# Patient Record
Sex: Female | Born: 1995 | Race: White | Hispanic: No | Marital: Single | Smoking: Current every day smoker
Health system: Southern US, Community
[De-identification: ages and names within clinical notes are randomized; demographics above are authoritative.]

## PROBLEM LIST (undated history)

## (undated) ENCOUNTER — Inpatient Hospital Stay: Payer: Self-pay

## (undated) DIAGNOSIS — D649 Anemia, unspecified: Secondary | ICD-10-CM

## (undated) DIAGNOSIS — Z8669 Personal history of other diseases of the nervous system and sense organs: Secondary | ICD-10-CM

## (undated) DIAGNOSIS — Z789 Other specified health status: Secondary | ICD-10-CM

## (undated) DIAGNOSIS — F419 Anxiety disorder, unspecified: Secondary | ICD-10-CM

## (undated) DIAGNOSIS — R569 Unspecified convulsions: Secondary | ICD-10-CM

## (undated) HISTORY — DX: Unspecified convulsions: R56.9

## (undated) HISTORY — DX: Personal history of other diseases of the nervous system and sense organs: Z86.69

## (undated) HISTORY — DX: Anemia, unspecified: D64.9

## (undated) HISTORY — DX: Anxiety disorder, unspecified: F41.9

## (undated) HISTORY — PX: NO PAST SURGERIES: SHX2092

---
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
## 2013-03-03 ENCOUNTER — Ambulatory Visit: Payer: Self-pay | Admitting: Family Medicine

## 2013-06-09 ENCOUNTER — Emergency Department: Payer: Self-pay | Admitting: Emergency Medicine

## 2013-06-09 LAB — COMPREHENSIVE METABOLIC PANEL
Albumin: 4.1 g/dL (ref 3.8–5.6)
Alkaline Phosphatase: 78 U/L — ABNORMAL LOW (ref 82–169)
Anion Gap: 6 — ABNORMAL LOW (ref 7–16)
BUN: 8 mg/dL (ref 7–18)
Bilirubin,Total: 0.3 mg/dL (ref 0.2–1.0)
Co2: 26 mmol/L (ref 21–32)
Osmolality: 276 (ref 275–301)
Potassium: 3.8 mmol/L (ref 3.5–5.1)
SGOT(AST): 25 U/L (ref 0–26)
Total Protein: 8.1 g/dL (ref 6.4–8.6)

## 2013-06-09 LAB — CBC
MCHC: 32.4 g/dL (ref 32.0–36.0)
RDW: 18.2 % — ABNORMAL HIGH (ref 11.5–14.5)
WBC: 10.3 10*3/uL (ref 3.6–11.0)

## 2013-06-09 LAB — URINALYSIS, COMPLETE
Bacteria: NONE SEEN
Blood: NEGATIVE
Ketone: NEGATIVE
Leukocyte Esterase: NEGATIVE
Nitrite: NEGATIVE
Protein: NEGATIVE
RBC,UR: 1 /HPF (ref 0–5)

## 2013-06-09 LAB — PROTIME-INR: Prothrombin Time: 14 secs (ref 11.5–14.7)

## 2013-06-09 LAB — LIPASE, BLOOD: Lipase: 161 U/L (ref 73–393)

## 2014-06-09 ENCOUNTER — Emergency Department: Payer: Self-pay | Admitting: Emergency Medicine

## 2014-06-09 LAB — URINALYSIS, COMPLETE
Bilirubin,UR: NEGATIVE
Blood: NEGATIVE
GLUCOSE, UR: NEGATIVE mg/dL (ref 0–75)
Leukocyte Esterase: NEGATIVE
Nitrite: NEGATIVE
PROTEIN: NEGATIVE
Ph: 5 (ref 4.5–8.0)
Specific Gravity: 1.02 (ref 1.003–1.030)
WBC UR: 2 /HPF (ref 0–5)

## 2014-06-09 LAB — COMPREHENSIVE METABOLIC PANEL
ALBUMIN: 4.4 g/dL (ref 3.4–5.0)
ALK PHOS: 67 U/L
ANION GAP: 7 (ref 7–16)
BUN: 8 mg/dL (ref 7–18)
Bilirubin,Total: 0.4 mg/dL (ref 0.2–1.0)
CALCIUM: 9.1 mg/dL (ref 8.5–10.1)
CHLORIDE: 106 mmol/L (ref 98–107)
CREATININE: 0.78 mg/dL (ref 0.60–1.30)
Co2: 28 mmol/L (ref 21–32)
EGFR (African American): >60
GLUCOSE: 83 mg/dL (ref 65–99)
OSMOLALITY: 279 (ref 275–301)
Potassium: 3.6 mmol/L (ref 3.5–5.1)
SGOT(AST): 22 U/L (ref 15–37)
SGPT (ALT): 22 U/L
Sodium: 141 mmol/L (ref 136–145)
Total Protein: 7.9 g/dL (ref 6.4–8.2)

## 2014-06-09 LAB — CBC WITH DIFFERENTIAL/PLATELET
Basophil #: 0.1 10*3/uL (ref 0.0–0.1)
Basophil %: 0.8 %
EOS ABS: 0 10*3/uL (ref 0.0–0.7)
Eosinophil %: 0.5 %
HCT: 41.6 % (ref 35.0–47.0)
HGB: 13.8 g/dL (ref 12.0–16.0)
LYMPHS ABS: 2.4 10*3/uL (ref 1.0–3.6)
Lymphocyte %: 32 %
MCH: 28.4 pg (ref 26.0–34.0)
MCHC: 33 g/dL (ref 32.0–36.0)
MCV: 86 fL (ref 80–100)
MONOS PCT: 5.2 %
Monocyte #: 0.4 x10 3/mm (ref 0.2–0.9)
NEUTROS ABS: 4.6 10*3/uL (ref 1.4–6.5)
Neutrophil %: 61.5 %
Platelet: 261 10*3/uL (ref 150–440)
RBC: 4.84 10*6/uL (ref 3.80–5.20)
RDW: 15 % — ABNORMAL HIGH (ref 11.5–14.5)
WBC: 7.4 10*3/uL (ref 3.6–11.0)

## 2014-06-09 LAB — TSH: Thyroid Stimulating Horm: 0.687 u[IU]/mL

## 2014-08-14 ENCOUNTER — Emergency Department: Payer: Self-pay | Admitting: Emergency Medicine

## 2014-08-14 LAB — CBC
HCT: 39.6 % (ref 35.0–47.0)
HGB: 13 g/dL (ref 12.0–16.0)
MCH: 28.6 pg (ref 26.0–34.0)
MCHC: 32.8 g/dL (ref 32.0–36.0)
MCV: 87 fL (ref 80–100)
PLATELETS: 228 10*3/uL (ref 150–440)
RBC: 4.54 10*6/uL (ref 3.80–5.20)
RDW: 13.6 % (ref 11.5–14.5)
WBC: 9.3 10*3/uL (ref 3.6–11.0)

## 2014-08-14 LAB — URINALYSIS, COMPLETE
BILIRUBIN, UR: NEGATIVE
Glucose,UR: NEGATIVE mg/dL (ref 0–75)
KETONE: NEGATIVE
NITRITE: POSITIVE
Ph: 5 (ref 4.5–8.0)
Protein: NEGATIVE
RBC,UR: 2 /HPF (ref 0–5)
Specific Gravity: 1.011 (ref 1.003–1.030)
Squamous Epithelial: 10

## 2014-08-14 LAB — COMPREHENSIVE METABOLIC PANEL
ALBUMIN: 3.8 g/dL (ref 3.4–5.0)
ANION GAP: 8 (ref 7–16)
Alkaline Phosphatase: 78 U/L
BUN: 8 mg/dL (ref 7–18)
Bilirubin,Total: 0.2 mg/dL (ref 0.2–1.0)
CREATININE: 0.77 mg/dL (ref 0.60–1.30)
Calcium, Total: 8.8 mg/dL (ref 8.5–10.1)
Chloride: 108 mmol/L — ABNORMAL HIGH (ref 98–107)
Co2: 25 mmol/L (ref 21–32)
EGFR (African American): >60
EGFR (Non-African Amer.): >60
Glucose: 103 mg/dL — ABNORMAL HIGH (ref 65–99)
Osmolality: 280 (ref 275–301)
Potassium: 3.5 mmol/L (ref 3.5–5.1)
SGOT(AST): 28 U/L (ref 15–37)
SGPT (ALT): 25 U/L
SODIUM: 141 mmol/L (ref 136–145)
Total Protein: 7.6 g/dL (ref 6.4–8.2)

## 2014-08-14 LAB — PREGNANCY, URINE: Pregnancy Test, Urine: NEGATIVE m[IU]/mL

## 2014-08-14 IMAGING — US ABDOMEN ULTRASOUND LIMITED
1 series · 14 of 25 positions shown · non-contrast
Comparison: 

REASON FOR EXAM: upper abdominal pain with eating eval
COMMENTS:   Body Site: GB and Fossa, CBD, Head of Pancreas

PROCEDURE:     US  - US ABDOMEN LIMITED SURVEY  - June 09, 2013  [DATE]
RESULT:     Right or quadrant abdominal ultrasound dated
TECHNIQUE: Real time sonographic imaging of the right quadrant was obtained.
Static representative images were provided for interpretation.

[Series 1: abdomen ultrasound limited · 0.31mm/px · 14 of 44 slices shown]
[im 1/44]
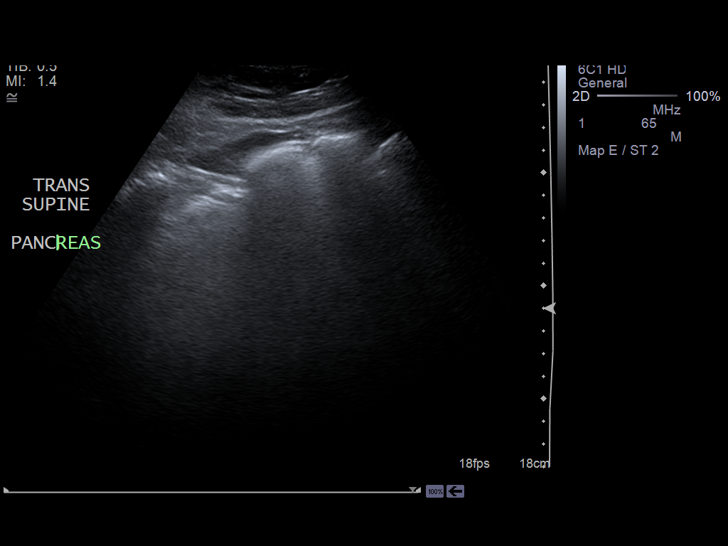
[im 4/44]
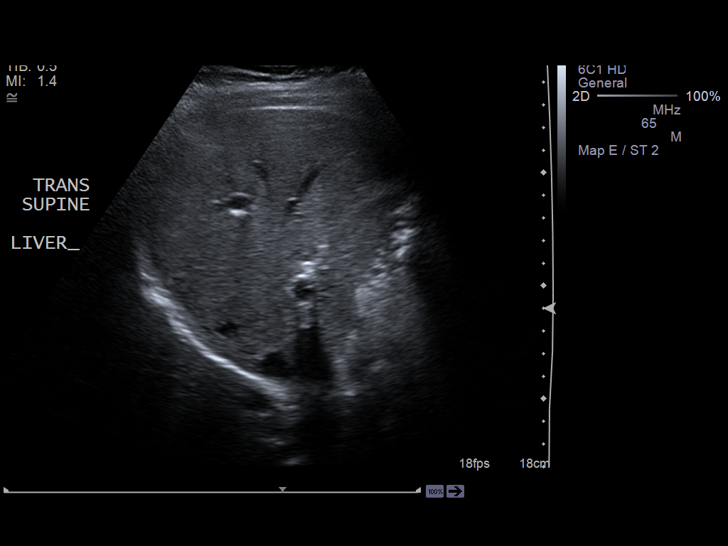
[im 8/44]
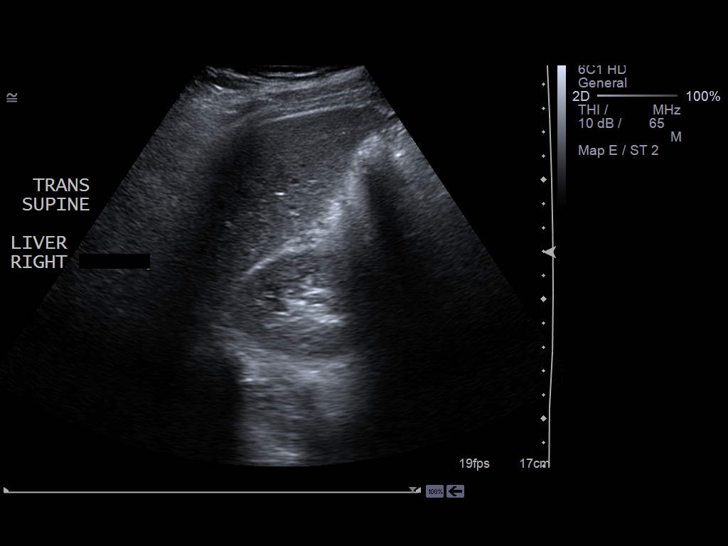
[im 11/44]
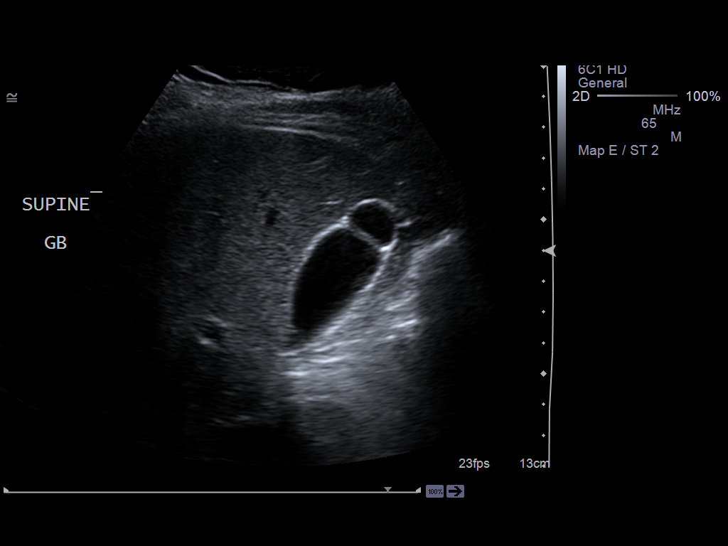
[im 15/44]
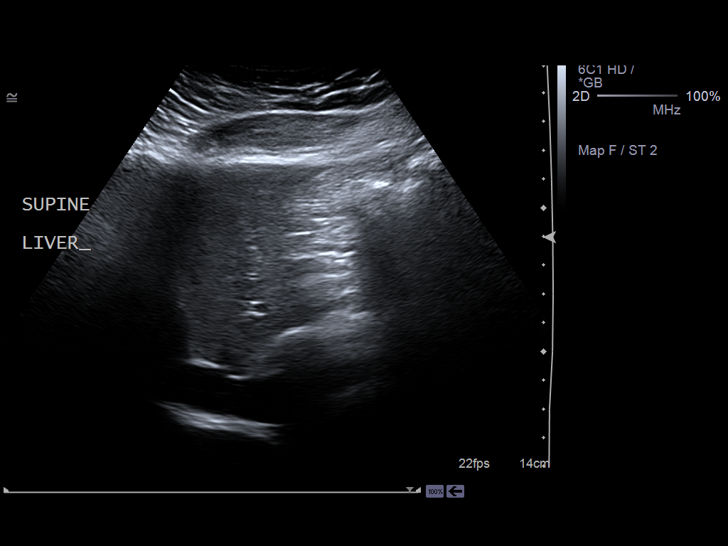
[im 17/44]
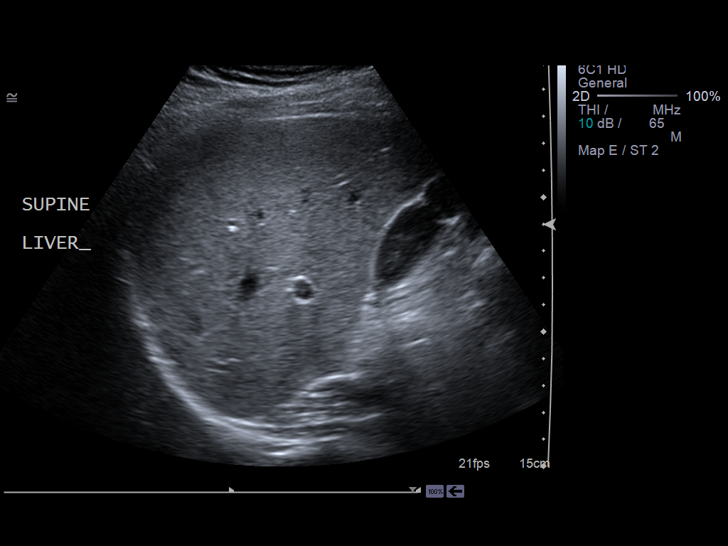
[im 20/44]
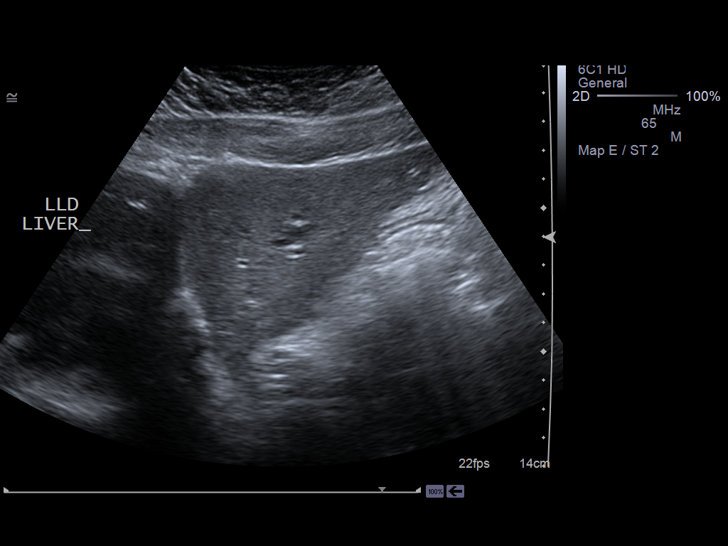
[im 24/44]
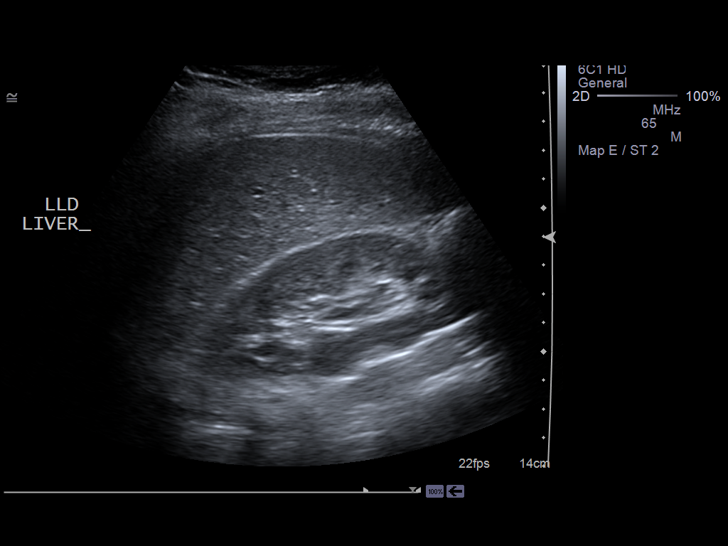
[im 27/44]
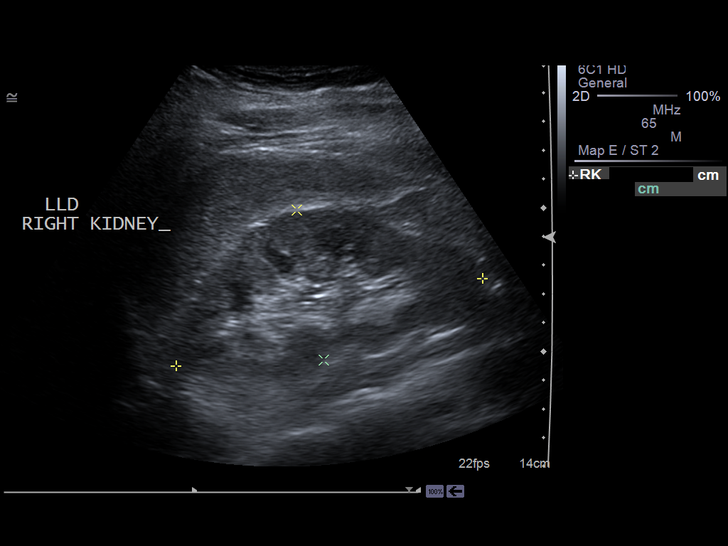
[im 29/44]
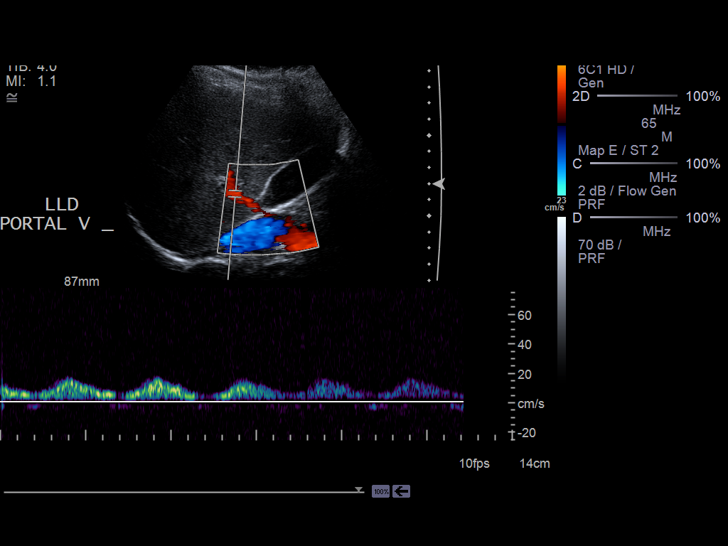
[im 33/44]
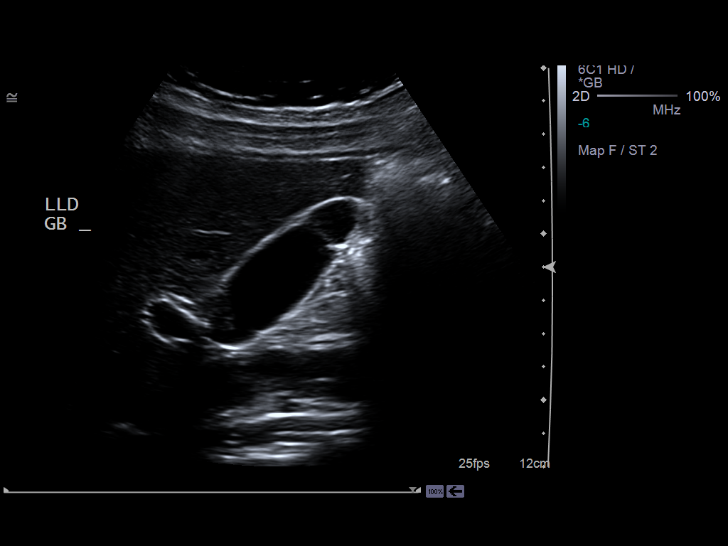
[im 36/44]
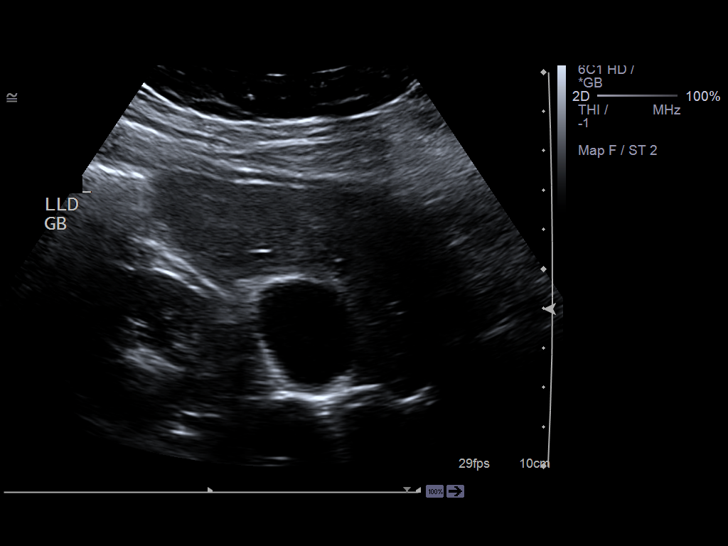
[im 40/44]
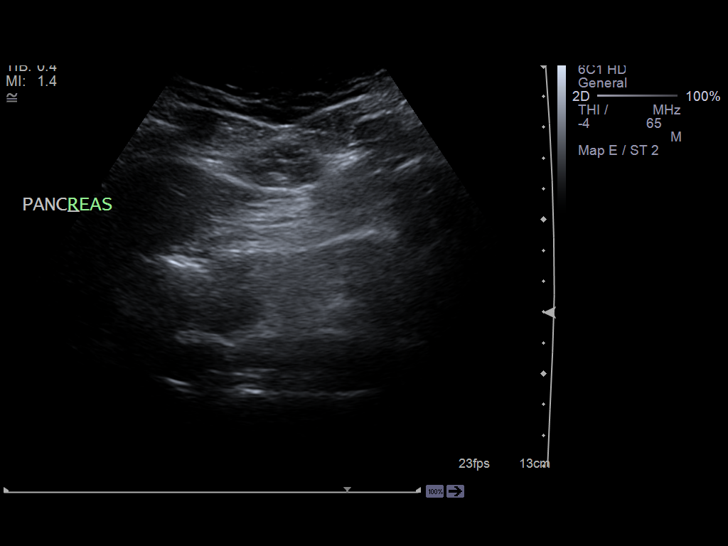
[im 44/44]
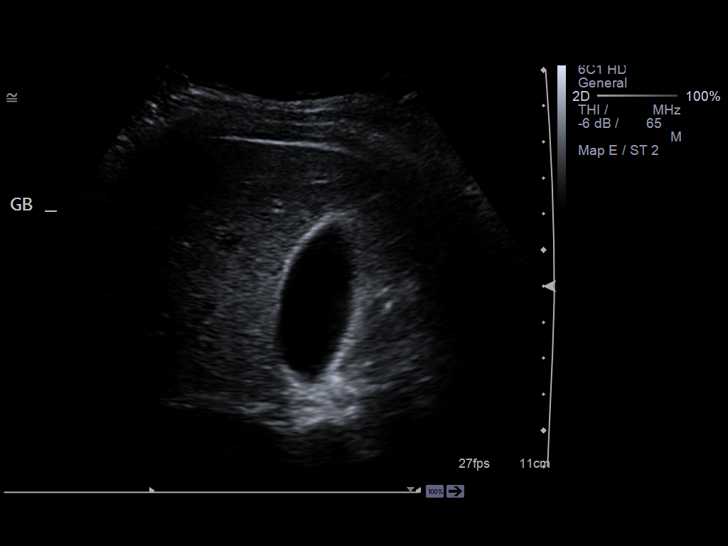

[14 of 25 positions shown; findings below may reference images not displayed]

FINDINGS: The liver is unremarkable. Hepatopedal flow is identified within
the portal vein.

There is no evidence of pericholecystic fluid, gallstones, sludging,
gallbladder wall thickening, nor common bile duct dilation. The gallbladder
wall thickness is 1.8 mm and the common bile duct measures 2.5 mm in
diameter. The technologist was unable to elicit a sonographic Murphy's sign.
The pancreas is unremarkable.
IMPRESSION: No sonographic evidence of cholecystitis nor cholelithiasis.
2. Dr. Kaderine of the emergency department was informed of these findings
per a preliminary faxed report.

## 2015-02-17 ENCOUNTER — Emergency Department: Admit: 2015-02-17 | Disposition: A | Discharge status: Home / Self Care | Payer: Self-pay | Admitting: Internal Medicine

## 2015-02-21 ENCOUNTER — Emergency Department
Admission: EM | Admit: 2015-02-21 | Discharge: 2015-02-21 | Disposition: A | Discharge status: Home / Self Care | Payer: Self-pay | Attending: Emergency Medicine | Admitting: Emergency Medicine

## 2015-02-21 DIAGNOSIS — R110 Nausea: Secondary | ICD-10-CM | POA: Insufficient documentation

## 2015-02-21 DIAGNOSIS — Z720 Tobacco use: Secondary | ICD-10-CM | POA: Insufficient documentation

## 2015-02-21 DIAGNOSIS — N939 Abnormal uterine and vaginal bleeding, unspecified: Secondary | ICD-10-CM | POA: Insufficient documentation

## 2015-02-21 DIAGNOSIS — Z3202 Encounter for pregnancy test, result negative: Secondary | ICD-10-CM | POA: Insufficient documentation

## 2015-02-21 LAB — POCT PREGNANCY, URINE: Preg Test, Ur: NEGATIVE

## 2015-02-21 MED ORDER — PROMETHAZINE HCL 25 MG RE SUPP: 25.0000 mg | Freq: Four times a day (QID) | RECTAL | Status: DC | PRN

## 2015-02-21 NOTE — ED Provider Notes (Signed)
[facility] Emergency Department Provider Note    ____________________________________________  Time seen: 7:51 PM  I have reviewed the triage vital signs and nursing notes.   HISTORY  Chief Complaint Vaginal Bleeding and Nausea       HPI Kimberly Mosley is a 19 y.o. female presents to ER because she has been bleeding for the last month despite being on Implanon. She states the last 2 years she has had a period last month. She denies pain since having nausea recently was concerned she was pregnant because she's not get married. She denies any other complaints.    History reviewed. No pertinent past medical history.  There are no active problems to display for this patient.   History reviewed. No pertinent past surgical history.  No current outpatient prescriptions on file.  Allergies Review of patient's allergies indicates not on file.  No family history on file.  Social History History  Substance Use Topics  . Smoking status: Current Every Day Smoker -- 5.00 packs/day    Types: Cigarettes  . Smokeless tobacco: Not on file  . Alcohol Use: No    Review of Systems  Constitutional: Negative for fever. Eyes: Negative for visual changes. ENT: Negative for sore throat. Cardiovascular: Negative for chest pain. Respiratory: Negative for shortness of breath. Gastrointestinal: Negative for abdominal pain, vomiting and diarrhea. Genitourinary: Negative for dysuria. Musculoskeletal: Negative for back pain. Skin: Negative for rash. Neurological: Negative for headaches, focal weakness or numbness.   10-point ROS otherwise negative.  ____________________________________________   PHYSICAL EXAM:  VITAL SIGNS: ED Triage Vitals  Enc Vitals Group     BP 02/21/15 1838 130/62 mmHg     Pulse Rate 02/21/15 1838 94     Resp 02/21/15 1838 15     Temp 02/21/15 1838 98.1 F (36.7 C)     Temp Source 02/21/15 1838 Oral     SpO2 02/21/15 1838 99  %     Weight 02/21/15 1838 135 lb (61.236 kg)     Height 02/21/15 1838 5\' 3"  (1.6 m)     Head Cir --      Peak Flow --      Pain Score --      Pain Loc --      Pain Edu? --      Excl. in GC? --      Constitutional: Alert and oriented. Well appearing and in no distress. Eyes: Conjunctivae are normal. PERRL. Normal extraocular movements. ENT   Head: Normocephalic and atraumatic.   Nose: No congestion/rhinnorhea.   Mouth/Throat: Mucous membranes are moist.   Neck: No stridor. Musculoskeletal: Nontender with normal range of motion in all extremities. No joint effusions.  No lower extremity tenderness nor edema. Implanon site is nontender and nonerythematous Neurologic:  Normal speech and language. No gross focal neurologic deficits are appreciated. Speech is normal. No gait instability. Skin:  Skin is warm, dry and intact. No rash noted. Psychiatric: Mood and affect are normal. Speech and behavior are normal. Patient exhibits appropriate insight and judgment.  ____________________________________________    LABS (pertinent positives/negatives)  Negative for pregnancy    PROCEDURES  Procedure(s) performed: None  Critical Care performed: No  ____________________________________________   INITIAL IMPRESSION / ASSESSMENT AND PLAN / ED COURSE  Pertinent labs & imaging results that were available during my care of patient were reviewed by me and considered in my medical decision making (see chart for details).  Nausea and vaginal bleeding  ____________________________________________   FINAL CLINICAL  IMPRESSION(S) / ED DIAGNOSES  Impression nausea and vaginal bleeding  Plan is discharge with Phenergan when necessary advised close follow with her OB/GYN doctor for removal of Implanon if desired  Emily Filbert, MD 02/21/15 1952

## 2015-02-21 NOTE — ED Notes (Signed)
Pt states she has the nexplanon implant and thinks is has moved from original position..states she has had a period constant for the past month..states she has not had a period for 2 years.Kimberly Mosley.denies pain, states she is having nausea a lot recently and is concerned about pregnancy..Kimberly Mosley

## 2015-02-21 NOTE — Discharge Instructions (Signed)
Nausea, Adult Nausea is the feeling that you have an upset stomach or have to vomit. Nausea by itself is not likely a serious concern, but it may be an early sign of more serious medical problems. As nausea gets worse, it can lead to vomiting. If vomiting develops, there is the risk of dehydration.  CAUSES   Viral infections.  Food poisoning.  Medicines.  Pregnancy.  Motion sickness.  Migraine headaches.  Emotional distress.  Severe pain from any source.  Alcohol intoxication. HOME CARE INSTRUCTIONS  Get plenty of rest.  Ask your caregiver about specific rehydration instructions.  Eat small amounts of food and sip liquids more often.  Take all medicines as told by your caregiver. SEEK MEDICAL CARE IF:  You have not improved after 2 days, or you get worse.  You have a headache. SEEK IMMEDIATE MEDICAL CARE IF:   You have a fever.  You faint.  You keep vomiting or have blood in your vomit.  You are extremely weak or dehydrated.  You have dark or bloody stools.  You have severe chest or abdominal pain. MAKE SURE YOU:  Understand these instructions.  Will watch your condition.  Will get help right away if you are not doing well or get worse. Document Released: 11/15/2004 Document Revised: 07/02/2012 Document Reviewed: 06/20/2011 [facility] Patient Information 2015 [place], [place]. This information is not intended to replace advice given to you by your health care provider. Make sure you discuss any questions you have with your health care provider. Etonogestrel implant What is this medicine? ETONOGESTREL (et oh noe JES trel) is a contraceptive (birth control) device. It is used to prevent pregnancy. It can be used for up to 3 years. This medicine may be used for other purposes; ask your health care provider or pharmacist if you have questions. COMMON BRAND NAME(S): Implanon, Nexplanon What should I tell my health care provider before I take this medicine? They  need to know if you have any of these conditions: -abnormal vaginal bleeding -blood vessel disease or blood clots -cancer of the breast, cervix, or liver -depression -diabetes -gallbladder disease -headaches -heart disease or recent heart attack -high blood pressure -high cholesterol -kidney disease -liver disease -renal disease -seizures -tobacco smoker -an unusual or allergic reaction to etonogestrel, other hormones, anesthetics or antiseptics, medicines, foods, dyes, or preservatives -pregnant or trying to get pregnant -breast-feeding How should I use this medicine? This device is inserted just under the skin on the inner side of your upper arm by a health care professional. Talk to your pediatrician regarding the use of this medicine in children. Special care may be needed. Overdosage: If you think you've taken too much of this medicine contact a poison control center or emergency room at once. Overdosage: If you think you have taken too much of this medicine contact a poison control center or emergency room at once. NOTE: This medicine is only for you. Do not share this medicine with others. What if I miss a dose? This does not apply. What may interact with this medicine? Do not take this medicine with any of the following medications: -amprenavir -bosentan -fosamprenavir This medicine may also interact with the following medications: -barbiturate medicines for inducing sleep or treating seizures -certain medicines for fungal infections like ketoconazole and itraconazole -griseofulvin -medicines to treat seizures like carbamazepine, felbamate, oxcarbazepine, phenytoin, topiramate -modafinil -phenylbutazone -rifampin -some medicines to treat HIV infection like atazanavir, indinavir, lopinavir, nelfinavir, tipranavir, ritonavir -St. John's wort This list may not describe all  possible interactions. Give your health care provider a list of all the medicines, herbs,  non-prescription drugs, or dietary supplements you use. Also tell them if you smoke, drink alcohol, or use illegal drugs. Some items may interact with your medicine. What should I watch for while using this medicine? This product does not protect you against HIV infection (AIDS) or other sexually transmitted diseases. You should be able to feel the implant by pressing your fingertips over the skin where it was inserted. Tell your doctor if you cannot feel the implant. What side effects may I notice from receiving this medicine? Side effects that you should report to your doctor or health care professional as soon as possible: -allergic reactions like skin rash, itching or hives, swelling of the face, lips, or tongue -breast lumps -changes in vision -confusion, trouble speaking or understanding -dark urine -depressed mood -general ill feeling or flu-like symptoms -light-colored stools -loss of appetite, nausea -right upper belly pain -severe headaches -severe pain, swelling, or tenderness in the abdomen -shortness of breath, chest pain, swelling in a leg -signs of pregnancy -sudden numbness or weakness of the face, arm or leg -trouble walking, dizziness, loss of balance or coordination -unusual vaginal bleeding, discharge -unusually weak or tired -yellowing of the eyes or skin Side effects that usually do not require medical attention (Report these to your doctor or health care professional if they continue or are bothersome.): -acne -breast pain -changes in weight -cough -fever or chills -headache -irregular menstrual bleeding -itching, burning, and vaginal discharge -pain or difficulty passing urine -sore throat This list may not describe all possible side effects. Call your doctor for medical advice about side effects. You may report side effects to FDA at 1-800-FDA-1088. Where should I keep my medicine? This drug is given in a hospital or clinic and will not be stored at  home. NOTE: This sheet is a summary. It may not cover all possible information. If you have questions about this medicine, talk to your doctor, pharmacist, or health care provider.  2015, Elsevier/[employer] Standard. (2012-04-14 15:37:45)

## 2015-02-21 NOTE — ED Notes (Signed)
Patient with no complaints at this time. Respirations even and unlabored. Skin warm/dry. Discharge instructions reviewed with patient at this time. Patient given opportunity to voice concerns/ask questions. Patient discharged at this time and left Emergency Department with steady gait.   

## 2015-03-30 ENCOUNTER — Encounter: Payer: Self-pay | Admitting: *Deleted

## 2015-03-30 DIAGNOSIS — J069 Acute upper respiratory infection, unspecified: Secondary | ICD-10-CM | POA: Insufficient documentation

## 2015-03-30 DIAGNOSIS — Z720 Tobacco use: Secondary | ICD-10-CM | POA: Insufficient documentation

## 2015-03-30 NOTE — ED Notes (Signed)
Pt states that she has had cold symptoms for one week (runny nose, sore throat, and cough). Pt also thinks she may be pregnant (denies vaginal bleeding or d/c).

## 2015-03-31 ENCOUNTER — Emergency Department
Admission: EM | Admit: 2015-03-31 | Discharge: 2015-03-31 | Discharge status: Against Medical Advice | Payer: Self-pay | Attending: Emergency Medicine | Admitting: Emergency Medicine

## 2015-04-06 LAB — POCT PREGNANCY, URINE: Preg Test, Ur: NEGATIVE

## 2015-05-09 ENCOUNTER — Emergency Department: Payer: Medicaid Other

## 2015-05-09 ENCOUNTER — Emergency Department
Admission: EM | Admit: 2015-05-09 | Discharge: 2015-05-09 | Disposition: A | Discharge status: Home / Self Care | Payer: Medicaid Other | Attending: Emergency Medicine | Admitting: Emergency Medicine

## 2015-05-09 DIAGNOSIS — Z3A10 10 weeks gestation of pregnancy: Secondary | ICD-10-CM | POA: Insufficient documentation

## 2015-05-09 DIAGNOSIS — O9989 Other specified diseases and conditions complicating pregnancy, childbirth and the puerperium: Secondary | ICD-10-CM | POA: Insufficient documentation

## 2015-05-09 DIAGNOSIS — O26899 Other specified pregnancy related conditions, unspecified trimester: Secondary | ICD-10-CM

## 2015-05-09 DIAGNOSIS — Z87891 Personal history of nicotine dependence: Secondary | ICD-10-CM | POA: Diagnosis not present

## 2015-05-09 DIAGNOSIS — R109 Unspecified abdominal pain: Secondary | ICD-10-CM | POA: Insufficient documentation

## 2015-05-09 LAB — URINALYSIS COMPLETE WITH MICROSCOPIC (ARMC ONLY)
BILIRUBIN URINE: NEGATIVE
GLUCOSE, UA: NEGATIVE mg/dL
HGB URINE DIPSTICK: NEGATIVE
Ketones, ur: NEGATIVE mg/dL
Leukocytes, UA: NEGATIVE
Nitrite: NEGATIVE
PROTEIN: NEGATIVE mg/dL
Specific Gravity, Urine: 1.02 (ref 1.005–1.030)
pH: 6 (ref 5.0–8.0)

## 2015-05-09 LAB — HCG, QUANTITATIVE, PREGNANCY: HCG, BETA CHAIN, QUANT, S: 2069 m[IU]/mL — AB (ref <5)

## 2015-05-09 MED ORDER — PROMETHAZINE HCL 25 MG PO TABS: 25.0000 mg | ORAL_TABLET | Freq: Four times a day (QID) | ORAL | Status: DC | PRN

## 2015-05-09 NOTE — ED Provider Notes (Signed)
[facility] Emergency Department Provider Note  ____________________________________________  Time seen: Approximately 3:37 PM  I have reviewed the triage vital signs and the nursing notes.   HISTORY  Chief Complaint Abdominal Pain   HPI Kimberly Mosley is a 19 y.o. female who presents with lower abdominal cramping pain for 1. Patient reports she is about [redacted] weeks pregnant denies any vaginal bleeding or discharge. States that she's been having some trouble with constipation but had normal bowel movement this morning. This is the patient's first pregnancy.   History reviewed. No pertinent past medical history.  There are no active problems to display for this patient.   History reviewed. No pertinent past surgical history.  Current Outpatient Rx  Name  Route  Sig  Dispense  Refill  . promethazine (PHENERGAN) 25 MG tablet   Oral   Take 1 tablet (25 mg total) by mouth every 6 (six) hours as needed for nausea or vomiting.   15 tablet   0     Allergies Review of patient's allergies indicates no known allergies.  No family history on file.  Social History History  Substance Use Topics  . Smoking status: Former Smoker -- 5.00 packs/day    Types: Cigarettes  . Smokeless tobacco: Not on file  . Alcohol Use: No    Review of Systems Constitutional: No fever/chills Eyes: No visual changes. ENT: No sore throat. Cardiovascular: Denies chest pain. Respiratory: Denies shortness of breath. Gastrointestinal: Positive for abdominal pain.  No nausea, no vomiting.  No diarrhea.  No constipation. Genitourinary: Negative for dysuria. Musculoskeletal: Negative for back pain. Skin: Negative for rash. Neurological: Negative for headaches, focal weakness or numbness.  10-point ROS otherwise negative.  ____________________________________________   PHYSICAL EXAM:  VITAL SIGNS: ED Triage Vitals  Enc Vitals Group     BP 05/09/15 1513 117/91 mmHg   Pulse Rate 05/09/15 1513 94     Resp 05/09/15 1513 16     Temp 05/09/15 1513 98 F (36.7 C)     Temp Source 05/09/15 1513 Oral     SpO2 05/09/15 1513 97 %     Weight 05/09/15 1513 147 lb (66.679 kg)     Height 05/09/15 1513  (1.6 m)     Head Cir --      Peak Flow --      Pain Score --      Pain Loc --      Pain Edu? --      Excl. in GC? --     Constitutional: Alert and oriented. Well appearing and in no acute distress. Eyes: Conjunctivae are normal. PERRL. EOMI. Head: Atraumatic. Nose: No congestion/rhinnorhea. Mouth/Throat: Mucous membranes are moist.  Oropharynx non-erythematous. Neck: No stridor.   Cardiovascular: Normal rate, regular rhythm. Grossly normal heart sounds.  Good peripheral circulation. Respiratory: Normal respiratory effort.  No retractions. Lungs CTAB. Gastrointestinal: Soft and nontender. No distention. No abdominal bruits. No CVA tenderness. Musculoskeletal: No lower extremity tenderness nor edema.  No joint effusions. Neurologic:  Normal speech and language. No gross focal neurologic deficits are appreciated. No gait instability. Skin:  Skin is warm, dry and intact. No rash noted. Psychiatric: Mood and affect are normal. Speech and behavior are normal.  ____________________________________________   LABS (all labs ordered are listed, but only abnormal results are displayed)  Labs Reviewed  HCG, QUANTITATIVE, PREGNANCY - Abnormal; Notable for the following:    hCG, Beta Chain, Quant, S 2069 (*)    All other components within  normal limits  URINALYSIS COMPLETEWITH MICROSCOPIC ([facility] ONLY) - Abnormal; Notable for the following:    Color, Urine YELLOW (*)    APPearance HAZY (*)    Bacteria, UA RARE (*)    Squamous Epithelial / LPF 0-5 (*)    All other components within normal limits   ____________________________________________  EKG  N/A  RADIOLOGY  Transvaginal and OB ultrasound positive for intrauterine pregnancy of about 5 weeks and 1  day. ____________________________________________   PROCEDURES  Procedure(s) performed: None  Critical Care performed: No  ____________________________________________   INITIAL IMPRESSION / ASSESSMENT AND PLAN / ED COURSE  Pertinent labs & imaging results that were available during my care of the patient were reviewed by me and considered in my medical decision making (see chart for details).  IUP with abdominal pains. Patient to follow-up in about 14 days for follow-up. Rx given for Phenergan 25 mg as needed for nausea otherwise Tylenol as needed for pain. ____________________________________________   FINAL CLINICAL IMPRESSION(S) / ED DIAGNOSES  Final diagnoses:  Abdominal pain affecting pregnancy, antepartum      Evangeline Dakinharles M Kimbree Casanas, PA-C 05/09/15 2030  Governor Rooksebecca Lord, MD 05/09/15 2329

## 2015-05-09 NOTE — ED Notes (Signed)
,  pt to ultrasound

## 2015-05-09 NOTE — Discharge Instructions (Signed)

## 2015-05-09 NOTE — ED Notes (Signed)
Pt c/o lower abd cramping intermittent for the past week, states she is about [redacted] weeks pregnant,..denies vaginal bleeding..states she has been having some trouble with constipation but had a decent BM this morning.

## 2015-06-06 ENCOUNTER — Emergency Department
Admission: EM | Admit: 2015-06-06 | Discharge: 2015-06-06 | Disposition: A | Discharge status: Home / Self Care | Payer: Self-pay | Attending: Emergency Medicine | Admitting: Emergency Medicine

## 2015-06-06 ENCOUNTER — Encounter: Payer: Self-pay | Admitting: Emergency Medicine

## 2015-06-06 DIAGNOSIS — O210 Mild hyperemesis gravidarum: Secondary | ICD-10-CM | POA: Insufficient documentation

## 2015-06-06 DIAGNOSIS — K5900 Constipation, unspecified: Secondary | ICD-10-CM | POA: Insufficient documentation

## 2015-06-06 DIAGNOSIS — Z87891 Personal history of nicotine dependence: Secondary | ICD-10-CM | POA: Insufficient documentation

## 2015-06-06 DIAGNOSIS — Z3A09 9 weeks gestation of pregnancy: Secondary | ICD-10-CM | POA: Insufficient documentation

## 2015-06-06 DIAGNOSIS — O99611 Diseases of the digestive system complicating pregnancy, first trimester: Secondary | ICD-10-CM | POA: Insufficient documentation

## 2015-06-06 MED ORDER — METOCLOPRAMIDE HCL 5 MG PO TABS: 5.0000 mg | ORAL_TABLET | Freq: Three times a day (TID) | ORAL | Status: DC

## 2015-06-06 NOTE — Discharge Instructions (Signed)
Hyperemesis Gravidarum °Hyperemesis gravidarum is a severe form of nausea and vomiting that happens during pregnancy. Hyperemesis is worse than morning sickness. It may cause you to have nausea or vomiting all day for many days. It may keep you from eating and drinking enough food and liquids. Hyperemesis usually occurs during the first half (the first 20 weeks) of pregnancy. It often goes away once a woman is in her second half of pregnancy. However, sometimes hyperemesis continues through an entire pregnancy.  °CAUSES  °The cause of this condition is not completely known but is thought to be related to changes in the body's hormones when pregnant. It could be from the high level of the pregnancy hormone or an increase in estrogen in the body.  °SIGNS AND SYMPTOMS  °· Severe nausea and vomiting. °· Nausea that does not go away. °· Vomiting that does not allow you to keep any food down. °· Weight loss and body fluid loss (dehydration). °· Having no desire to eat or not liking food you have previously enjoyed. °DIAGNOSIS  °Your health care provider will do a physical exam and ask you about your symptoms. He or she may also order blood tests and urine tests to make sure something else is not causing the problem.  °TREATMENT  °You may only need medicine to control the problem. If medicines do not control the nausea and vomiting, you will be treated in the hospital to prevent dehydration, increased acid in the blood (acidosis), weight loss, and changes in the electrolytes in your body that may harm the unborn baby (fetus). You may need IV fluids.  °HOME CARE INSTRUCTIONS  °· Only take over-the-counter or prescription medicines as directed by your health care provider. °· Try eating a couple of dry crackers or toast in the morning before getting out of bed. °· Avoid foods and smells that upset your stomach. °· Avoid fatty and spicy foods. °· Eat 5-6 small meals a day. °· Do not drink when eating meals. Drink between  meals. °· For snacks, eat high-protein foods, such as cheese. °· Eat or suck on things that have ginger in them. Ginger helps nausea. °· Avoid food preparation. The smell of food can spoil your appetite. °· Avoid iron pills and iron in your multivitamins until after 3-4 months of being pregnant. However, consult with your health care provider before stopping any prescribed iron pills. °SEEK MEDICAL CARE IF:  °· Your abdominal pain increases. °· You have a severe headache. °· You have vision problems. °· You are losing weight. °SEEK IMMEDIATE MEDICAL CARE IF:  °· You are unable to keep fluids down. °· You vomit blood. °· You have constant nausea and vomiting. °· You have excessive weakness. °· You have extreme thirst. °· You have dizziness or fainting. °· You have a fever or persistent symptoms for more than 2-3 days. °· You have a fever and your symptoms suddenly get worse. °MAKE SURE YOU:  °· Understand these instructions. °· Will watch your condition. °· Will get help right away if you are not doing well or get worse. °Document Released: 10/08/2005 Document Revised: 07/29/2013 Document Reviewed: 05/20/2013 °[employer]® Patient Information ©2015 [employer]. This information is not intended to replace advice given to you by your health care provider. Make sure you discuss any questions you have with your health care provider. ° ° °Eating Plan for Hyperemesis Gravidarum °Severe cases of hyperemesis gravidarum can lead to dehydration and malnutrition. The hyperemesis eating plan is one way to lessen   symptoms of nausea and vomiting. It is often used with prescribed medicines to control your symptoms.  WHAT CAN I DO TO RELIEVE MY SYMPTOMS? Listen to your body. Everyone is different and has different preferences. Find what works best for you. Some of the following things may help:  Eat and drink slowly.  Eat 5-6 small meals daily instead of 3 large meals.   Eat crackers before you get out of bed in the  morning.   Starchy foods are usually well tolerated (such as cereal, toast, bread, potatoes, pasta, rice, and pretzels).   Ginger may help with nausea. Add  tsp ground ginger to hot tea or choose ginger tea.   Try drinking 100% fruit juice or an electrolyte drink.  Continue to take your prenatal vitamins as directed by your health care provider. If you are having trouble taking your prenatal vitamins, talk with your health care provider about different options.  Include at least 1 serving of protein with your meals and snacks (such as meats or poultry, beans, nuts, eggs, or yogurt). Try eating a protein-rich snack before bed (such as cheese and crackers or a half [place] or peanut butter sandwich). WHAT THINGS SHOULD I AVOID TO REDUCE MY SYMPTOMS? The following things may help reduce your symptoms:  Avoid foods with strong smells. Try eating meals in well-ventilated areas that are free of odors.  Avoid drinking water or other beverages with meals. Try not to drink anything less than 30 minutes before and after meals.  Avoid drinking more than 1 cup of fluid at a time.  Avoid fried or high-fat foods, such as butter and cream sauces.  Avoid spicy foods.  Avoid skipping meals the best you can. Nausea can be more intense on an empty stomach. If you cannot tolerate food at that time, do not force it. Try sucking on ice chips or other frozen items and make up the calories later.  Avoid lying down within 2 hours after eating. Document Released: 08/05/2007 Document Revised: 10/13/2013 Document Reviewed: 08/12/2013 [facility] Patient Information 2015 [place], [place]. This information is not intended to replace advice given to you by your health care provider. Make sure you discuss any questions you have with your health care provider.  Take the nausea medicine as directed. Follow-up with your [facility] provider as scheduled. Take OTC Miralax (polyethylene glycol) daily for stool softening.

## 2015-06-06 NOTE — ED Provider Notes (Signed)
[facility] Emergency Department Provider Note ____________________________________________  Time seen: 1905  I have reviewed the triage vital signs and the nursing notes.  HISTORY  Chief Complaint  Emesis and Constipation  HPI Kimberly Mosley is a 19 y.o. female reports to the ED for concern about possible stomach blood that she went from a boyfriend. She describes only one episode of emesis that was today, but otherwise was able to eat lunch at the local [employer] without vomiting. She denies any fevers, chills, sweats, or diarrhea. She continues to do with her baseline constipation, noting a stool about every 3-4 days. She denies any vaginal discharge vaginal bleeding, cramping. She schedule see her OB provider for initial prenatal visit in about 2 weeks.  History reviewed. No pertinent past medical history.  There are no active problems to display for this patient.  History reviewed. No pertinent past surgical history.  Current Outpatient Rx  Name  Route  Sig  Dispense  Refill  . metoCLOPramide (REGLAN) 5 MG tablet   Oral   Take 1 tablet (5 mg total) by mouth 3 (three) times daily.   15 tablet   1   . promethazine (PHENERGAN) 25 MG tablet   Oral   Take 1 tablet (25 mg total) by mouth every 6 (six) hours as needed for nausea or vomiting.   15 tablet   0    Allergies Review of patient's allergies indicates no known allergies.  No family history on file.  Social History Social History  Substance Use Topics  . Smoking status: Former Smoker -- 5.00 packs/day    Types: Cigarettes  . Smokeless tobacco: None  . Alcohol Use: No   Review of Systems  Constitutional: Negative for fever. Eyes: Negative for visual changes. ENT: Negative for sore throat. Cardiovascular: Negative for chest pain. Respiratory: Negative for shortness of breath. Gastrointestinal: Negative for abdominal pain and diarrhea. Reports nausea & vomiting as  above Genitourinary: Negative for dysuria. Musculoskeletal: Negative for back pain. Skin: Negative for rash. Neurological: Negative for headaches, focal weakness or numbness. ____________________________________________  PHYSICAL EXAM:  VITAL SIGNS: ED Triage Vitals  Enc Vitals Group     BP 06/06/15 1732 117/80 mmHg     Pulse Rate 06/06/15 1732 86     Resp 06/06/15 1732 16     Temp 06/06/15 1732 98.9 F (37.2 C)     Temp Source 06/06/15 1732 Oral     SpO2 06/06/15 1732 96 %     Weight 06/06/15 1732 140 lb (63.504 kg)     Height 06/06/15 1732  (1.6 m)     Head Cir --      Peak Flow --      Pain Score 06/06/15 1732 0     Pain Loc --      Pain Edu? --      Excl. in GC? --    Constitutional: Alert and oriented. Well appearing and in no distress. Eyes: Conjunctivae are normal. PERRL. Normal extraocular movements. ENT   Head: Normocephalic and atraumatic.   Nose: No congestion/rhinnorhea.   Mouth/Throat: Mucous membranes are moist.   Neck: Supple. No thyromegaly. Hematological/Lymphatic/Immunilogical: No cervical lymphadenopathy. Cardiovascular: Normal rate, regular rhythm.  Respiratory: Normal respiratory effort. No wheezes/rales/rhonchi. Gastrointestinal: Soft and nontender. No distention. Musculoskeletal: Nontender with normal range of motion in all extremities.  Neurologic:  Normal gait without ataxia. Normal speech and language. No gross focal neurologic deficits are appreciated. Skin:  Skin is warm, dry and intact.  No rash noted. Psychiatric: Mood and affect are normal. Patient exhibits appropriate insight and judgment. ____________________________________________  INITIAL IMPRESSION / ASSESSMENT AND PLAN / ED COURSE  Stable vital signs and exam. Reassurance to patient about supportive care of viral GI symptoms as well as supportive care of pregnancy-related emesis.  Will provide Reglan and instructions for daily Miralax. Follow-up with OB provider as  scheduled. Return as needed.  ____________________________________________  FINAL CLINICAL IMPRESSION(S) / ED DIAGNOSES  Final diagnoses:  Hyperemesis arising during pregnancy     Lissa Hoard, PA-C 06/06/15 1933  Maurilio Lovely, MD 06/06/15 2017

## 2015-06-06 NOTE — ED Notes (Addendum)
Pt [redacted] weeks pregnant, c/o N/V, sts her boyfriend was sick with a 'stomach bug' now she thinks she has it.  Pt had 1 episode of emesis today, sts she feels hungry but nauseous at the same time, also c/o headache, did not know what to take OTC for her headache, due to her pregnancy.  Pt drinking a Dr Reino Kent upon entry to room 41.

## 2015-06-06 NOTE — ED Notes (Addendum)
Pt reports vomiting since Sunday night; reports constipation, last BM was Thursday or Friday. Pt eating cheetohs in lobby and triage room. Pt reports [redacted] weeks pregnant, [facility] 01/08/16. Pt reports fiance had the flu last week and has been vomiting since.

## 2015-07-07 ENCOUNTER — Emergency Department
Admission: EM | Admit: 2015-07-07 | Discharge: 2015-07-07 | Disposition: A | Discharge status: Home / Self Care | Payer: Medicaid Other | Attending: Emergency Medicine | Admitting: Emergency Medicine

## 2015-07-07 ENCOUNTER — Encounter: Payer: Self-pay | Admitting: Emergency Medicine

## 2015-07-07 DIAGNOSIS — S30811A Abrasion of abdominal wall, initial encounter: Secondary | ICD-10-CM | POA: Diagnosis not present

## 2015-07-07 DIAGNOSIS — Z79899 Other long term (current) drug therapy: Secondary | ICD-10-CM | POA: Insufficient documentation

## 2015-07-07 DIAGNOSIS — W208XXA Other cause of strike by thrown, projected or falling object, initial encounter: Secondary | ICD-10-CM | POA: Diagnosis not present

## 2015-07-07 DIAGNOSIS — Z3A13 13 weeks gestation of pregnancy: Secondary | ICD-10-CM | POA: Diagnosis not present

## 2015-07-07 DIAGNOSIS — O9A211 Injury, poisoning and certain other consequences of external causes complicating pregnancy, first trimester: Secondary | ICD-10-CM | POA: Diagnosis present

## 2015-07-07 DIAGNOSIS — Y990 Civilian activity done for income or pay: Secondary | ICD-10-CM | POA: Diagnosis not present

## 2015-07-07 DIAGNOSIS — Y9389 Activity, other specified: Secondary | ICD-10-CM | POA: Insufficient documentation

## 2015-07-07 DIAGNOSIS — S3991XA Unspecified injury of abdomen, initial encounter: Secondary | ICD-10-CM

## 2015-07-07 DIAGNOSIS — Y9289 Other specified places as the place of occurrence of the external cause: Secondary | ICD-10-CM | POA: Diagnosis not present

## 2015-07-07 DIAGNOSIS — Z87891 Personal history of nicotine dependence: Secondary | ICD-10-CM | POA: Diagnosis not present

## 2015-07-07 NOTE — Discharge Instructions (Signed)
Blunt Abdominal Trauma °A blunt injury to the abdomen can cause pain. The pain is most likely from bruising and stretching of your muscles. This pain is often made worse with movement. Most often these injuries are not serious and get better within 1 week with rest and mild pain medicine. However, internal organs (liver, spleen, kidneys) can be injured with blunt trauma. If you do not get better or if you get worse, further examination may be needed. °Continue with your regular daily activities, but avoid any strenuous activities until your pain is improved. If your stomach is upset, stick to a clear liquid diet and slowly advance to solid food.  °SEEK IMMEDIATE MEDICAL CARE IF:  °· You develop increasing pain, nausea, or repeated vomiting. °· You develop chest pain or breathing difficulty. °· You develop blood in the urine, vomit, or stool. °· You develop weakness, fainting, fever, or other serious complaints. °Document Released: 11/15/2004 Document Revised: 12/31/2011 Document Reviewed: 03/03/2009 °Exit[employer]® Patient Information ©2015 [employer]. This information is not intended to replace advice given to you by your health care provider. Make sure you discuss any questions you have with your health care provider. ° °

## 2015-07-07 NOTE — ED Notes (Signed)
Pt states tea container at work fell on her belly this AM, denies any cramping or vaginal bleeding, 1st pregnancy, states she does not want to file workers comp

## 2015-07-07 NOTE — ED Notes (Signed)
Was at work and at tea machine fell and hit her abdomen.  Says hot water was in it, but she did not get the water on her.  Was concerned gbecause the machine hit her abdomen and she is 13 weekpregneat

## 2015-07-07 NOTE — ED Provider Notes (Signed)
[facility] Emergency Department Provider Note  ____________________________________________  Time seen: 5:15 PM  I have reviewed the triage vital signs and the nursing notes.   HISTORY  Chief Complaint Abdominal Pain    HPI Kimberly Mosley is a 19 y.o. female who presents for evaluation due to a beverage container falling on her abdomen this morning. She was at work when this happened, and as she was moving the TV container, her elbow grazed a hot water tap causing her to flinch and lose control of the container. He fell against her abdomen and into the floor. She denies any complaints at this time. No chest pain shortness of breath or other injuries. No abdominal pain, no vaginal bleeding or discharge.  She is about [redacted] weeks pregnant. She plans to follow up with Deatra Robinson when her replacement insurance card arrives, but currently she is seeing the health department.   History reviewed. No pertinent past medical history.   There are no active problems to display for this patient.    History reviewed. No pertinent past surgical history.   Current Outpatient Rx  Name  Route  Sig  Dispense  Refill  . metoCLOPramide (REGLAN) 5 MG tablet   Oral   Take 1 tablet (5 mg total) by mouth 3 (three) times daily.   15 tablet   1   . promethazine (PHENERGAN) 25 MG tablet   Oral   Take 1 tablet (25 mg total) by mouth every 6 (six) hours as needed for nausea or vomiting.   15 tablet   0      Allergies Review of patient's allergies indicates no known allergies.   No family history on file.  Social History Social History  Substance Use Topics  . Smoking status: Former Smoker -- 5.00 packs/day    Types: Cigarettes  . Smokeless tobacco: None  . Alcohol Use: No    Review of Systems  Constitutional:   No fever or chills. No weight changes Eyes:   No blurry vision or double vision.  ENT:   No sore throat. Cardiovascular:   No chest  pain. Respiratory:   No dyspnea or cough. Gastrointestinal:   Negative for abdominal pain, vomiting and diarrhea.  No BRBPR or melena. Genitourinary:   Negative for dysuria, urinary retention, bloody urine, or difficulty urinating. Musculoskeletal:   Negative for back pain. No joint swelling or pain. Skin:   Negative for rash. Neurological:   Negative for headaches, focal weakness or numbness. Psychiatric:  No anxiety or depression.   Endocrine:  No hot/cold intolerance, changes in energy, or sleep difficulty.  10-point ROS otherwise negative.  ____________________________________________   PHYSICAL EXAM:  VITAL SIGNS: ED Triage Vitals  Enc Vitals Group     BP 07/07/15 1454 103/63 mmHg     Pulse Rate 07/07/15 1454 94     Resp 07/07/15 1454 14     Temp 07/07/15 1454 97.8 F (36.6 C)     Temp Source 07/07/15 1454 Oral     SpO2 07/07/15 1454 98 %     Weight 07/07/15 1454 145 lb (65.772 kg)     Height 07/07/15 1454 5\' 3"  (1.6 m)     Head Cir --      Peak Flow --      Pain Score 07/07/15 1455 0     Pain Loc --      Pain Edu? --      Excl. in GC? --      Constitutional:  Alert and oriented. Well appearing and in no distress. Eyes:   No scleral icterus. No conjunctival pallor. PERRL. EOMI ENT   Head:   Normocephalic and atraumatic.   Nose:   No congestion/rhinnorhea. No septal hematoma   Mouth/Throat:   MMM, no pharyngeal erythema. No peritonsillar mass. No uvula shift.   Neck:   No stridor. No SubQ emphysema. No meningismus. Hematological/Lymphatic/Immunilogical:   No cervical lymphadenopathy. Cardiovascular:   RRR. Normal and symmetric distal pulses are present in all extremities. No murmurs, rubs, or gallops. Respiratory:   Normal respiratory effort without tachypnea nor retractions. Breath sounds are clear and equal bilaterally. No wheezes/rales/rhonchi. Gastrointestinal:   Soft and nontender. No distention. There is no CVA tenderness.  No rebound,  rigidity, or guarding.gravid uterus palpable, size consistent with dates, nontender Genitourinary:   deferred Musculoskeletal:   Nontender with normal range of motion in all extremities. No joint effusions.  No lower extremity tenderness.  No edema. Neurologic:   Normal speech and language.  CN 2-10 normal. Motor grossly intact. No pronator drift.  Normal gait. No gross focal neurologic deficits are appreciated.  Skin:    Skin is warm, dry and intact. No rash noted.  No petechiae, purpura, or bullae.very small 3 cm superficial abrasion on the left lower quadrant abdomen. No ecchymosis Psychiatric:   Mood and affect are normal. Speech and behavior are normal. Patient exhibits appropriate insight and judgment.  ____________________________________________    LABS (pertinent positives/negatives) (all labs ordered are listed, but only abnormal results are displayed) Labs Reviewed - No data to display ____________________________________________   EKG    ____________________________________________    RADIOLOGY    ____________________________________________   PROCEDURES   ____________________________________________   INITIAL IMPRESSION / ASSESSMENT AND PLAN / ED COURSE  Pertinent labs & imaging results that were available during my care of the patient were reviewed by me and considered in my medical decision making (see chart for details).  Patient with pre-viable pregnancy presents after minor abdominal trauma. Exam is completely reassuring, fetal heart tones audible per nursing. Low suspicion for ectopic or torsion or any other serious abdominal trauma. She is very well-appearing. She is medically clear and may return to work.    ____________________________________________   FINAL CLINICAL IMPRESSION(S) / ED DIAGNOSES  Final diagnoses:  Blunt abdominal trauma, initial encounter      Sharman Cheek, MD 07/07/15 1726

## 2015-07-24 ENCOUNTER — Emergency Department: Payer: Medicaid Other

## 2015-07-24 ENCOUNTER — Encounter: Payer: Self-pay | Admitting: Emergency Medicine

## 2015-07-24 ENCOUNTER — Emergency Department
Admission: EM | Admit: 2015-07-24 | Discharge: 2015-07-24 | Disposition: A | Discharge status: Home / Self Care | Payer: Medicaid Other | Attending: Emergency Medicine | Admitting: Emergency Medicine

## 2015-07-24 DIAGNOSIS — Z3A15 15 weeks gestation of pregnancy: Secondary | ICD-10-CM | POA: Diagnosis not present

## 2015-07-24 DIAGNOSIS — Z87891 Personal history of nicotine dependence: Secondary | ICD-10-CM | POA: Insufficient documentation

## 2015-07-24 DIAGNOSIS — Z79899 Other long term (current) drug therapy: Secondary | ICD-10-CM | POA: Insufficient documentation

## 2015-07-24 DIAGNOSIS — O209 Hemorrhage in early pregnancy, unspecified: Secondary | ICD-10-CM | POA: Diagnosis present

## 2015-07-24 LAB — HCG, QUANTITATIVE, PREGNANCY: hCG, Beta Chain, Quant, S: 16075 m[IU]/mL — ABNORMAL HIGH (ref <5)

## 2015-07-24 LAB — URINALYSIS COMPLETE WITH MICROSCOPIC (ARMC ONLY)
Bilirubin Urine: NEGATIVE
Glucose, UA: NEGATIVE mg/dL
Ketones, ur: NEGATIVE mg/dL
Leukocytes, UA: NEGATIVE
Nitrite: NEGATIVE
Protein, ur: NEGATIVE mg/dL
Specific Gravity, Urine: 1.024 (ref 1.005–1.030)
pH: 5 (ref 5.0–8.0)

## 2015-07-24 LAB — ABO/RH: ABO/RH(D): O POS

## 2015-07-24 NOTE — Discharge Instructions (Signed)
Vaginal Bleeding During Pregnancy, Second Trimester A small amount of bleeding (spotting) from the vagina is relatively common in pregnancy. It usually stops on its own. Various things can cause bleeding or spotting in pregnancy. Some bleeding may be related to the pregnancy, and some may not. Sometimes the bleeding is normal and is not a problem. However, bleeding can also be a sign of something serious. Be sure to tell your health care provider about any vaginal bleeding right away. Some possible causes of vaginal bleeding during the second trimester include:  Infection, inflammation, or growths on the cervix.   The placenta may be partially or completely covering the opening of the cervix inside the uterus (placenta previa).  The placenta may have separated from the uterus (abruption of the placenta).   You may be having early (preterm) labor.   The cervix may not be strong enough to keep a baby inside the uterus (cervical insufficiency).   Tiny cysts may have developed in the uterus instead of pregnancy tissue (molar pregnancy). HOME CARE INSTRUCTIONS  Watch your condition for any changes. The following actions may help to lessen any discomfort you are feeling:  Follow your health care provider's instructions for limiting your activity. If your health care provider orders bed rest, you may need to stay in bed and only get up to use the bathroom. However, your health care provider may allow you to continue light activity.  If needed, make plans for someone to help with your regular activities and responsibilities while you are on bed rest.  Keep track of the number of pads you use each day, how often you change pads, and how soaked (saturated) they are. Write this down.  Do not use tampons. Do not douche.  Do not have sexual intercourse or orgasms until approved by your health care provider.  If you pass any tissue from your vagina, save the tissue so you can show it to your  health care provider.  Only take over-the-counter or prescription medicines as directed by your health care provider.  Do not take aspirin because it can make you bleed.  Do not exercise or perform any strenuous activities or heavy lifting without your health care provider's permission.  Keep all follow-up appointments as directed by your health care provider. SEEK MEDICAL CARE IF:  You have any vaginal bleeding during any part of your pregnancy.  You have cramps or labor pains.  You have a fever, not controlled by medicine. SEEK IMMEDIATE MEDICAL CARE IF:   You have severe cramps in your back or belly (abdomen).  You have contractions.  You have chills.  You pass large clots or tissue from your vagina.  Your bleeding increases.  You feel light-headed or weak, or you have fainting episodes.  You are leaking fluid or have a gush of fluid from your vagina. MAKE SURE YOU:  Understand these instructions.  Will watch your condition.  Will get help right away if you are not doing well or get worse. Document Released: 07/18/2005 Document Revised: 10/13/2013 Document Reviewed: 06/15/2013 [facility] Patient Information 2015 [place], [place]. This information is not intended to replace advice given to you by your health care provider. Make sure you discuss any questions you have with your health care provider.  Please return immediately if condition worsens. Please contact her primary physician or the physician you were given for referral. If you have any specialist physicians involved in her treatment and plan please also contact them. Thank you for using [place]  regional emergency Department.

## 2015-07-24 NOTE — ED Notes (Signed)
Nurse unable to obtain fetal heart tones. Discussed case with Mayford Knife, md order for ultrasound placed

## 2015-07-24 NOTE — ED Provider Notes (Signed)
Time Seen: Approximately 2034 I have reviewed the triage notes  Chief Complaint: Vaginal Bleeding   History of Present Illness: Kimberly Mosley is a 19 y.o. female who is in her second trimester of her pregnancy. Patient states she noticed some vaginal bleeding this morning there was a small amount for only a brief period of time. She denies any bleeding since that time and denies any shortness of breath or near syncopal symptoms. She denies any nausea vomiting etc.  History reviewed. No pertinent past medical history.  There are no active problems to display for this patient.   History reviewed. No pertinent past surgical history.  History reviewed. No pertinent past surgical history.  Current Outpatient Rx  Name  Route  Sig  Dispense  Refill  . promethazine (PHENERGAN) 25 MG tablet   Oral   Take 1 tablet (25 mg total) by mouth every 6 (six) hours as needed for nausea or vomiting.   15 tablet   0   . metoCLOPramide (REGLAN) 5 MG tablet   Oral   Take 1 tablet (5 mg total) by mouth 3 (three) times daily.   15 tablet   1     Allergies:  Review of patient's allergies indicates no known allergies.  Family History: History reviewed. No pertinent family history.  Social History: Social History  Substance Use Topics  . Smoking status: Former Smoker -- 5.00 packs/day    Types: Cigarettes  . Smokeless tobacco: None  . Alcohol Use: No     Review of Systems:   10 point review of systems was performed and was otherwise negative:  Constitutional: No fever Eyes: No visual disturbances ENT: No sore throat, ear pain Cardiac: No chest pain Respiratory: No shortness of breath, wheezing, or stridor Abdomen: No abdominal pain, no vomiting, No diarrhea Endocrine: No weight loss, No night sweats Extremities: No peripheral edema, cyanosis Skin: No rashes, easy bruising Neurologic: No focal weakness, trouble with speech or swollowing Urologic: No dysuria, Hematuria, or  urinary frequency   Physical Exam:  ED Triage Vitals  Enc Vitals Group     BP 07/24/15 1514 127/65 mmHg     Pulse Rate 07/24/15 1514 75     Resp 07/24/15 1514 16     Temp 07/24/15 1514 98.3 F (36.8 C)     Temp Source 07/24/15 1514 Oral     SpO2 07/24/15 1514 100 %     Weight 07/24/15 1514 145 lb (65.772 kg)     Height 07/24/15 1514  (1.6 m)     Head Cir --      Peak Flow --      Pain Score 07/24/15 2101 0     Pain Loc --      Pain Edu? --      Excl. in GC? --     General: Awake , Alert , and Oriented times 3; GCS 15 Head: Normal cephalic , atraumatic Eyes: Pupils equal , round, reactive to light Nose/Throat: No nasal drainage, patent upper airway without erythema or exudate.  Neck: Supple, Full range of motion, No anterior adenopathy or palpable thyroid masses Lungs: Clear to ascultation without wheezes , rhonchi, or rales Heart: Regular rate, regular rhythm without murmurs , gallops , or rubs Abdomen: Soft, non tender without rebound, guarding , or rigidity; bowel sounds positive and symmetric in all 4 quadrants. No organomegaly .        Extremities: 2 plus symmetric pulses. No edema, clubbing or cyanosis Neurologic: normal  ambulation, Motor symmetric without deficits, sensory intact Skin: warm, dry, no rashes Pelvic exam was deferred for ultrasound evaluation  Labs:   All laboratory work was reviewed including any pertinent negatives or positives listed below:  Labs Reviewed  HCG, QUANTITATIVE, PREGNANCY - Abnormal; Notable for the following:    hCG, Beta Chain, Quant, S 16075 (*)    All other components within normal limits  URINALYSIS COMPLETEWITH MICROSCOPIC ([facility] ONLY) - Abnormal; Notable for the following:    Color, Urine YELLOW (*)    APPearance HAZY (*)    Hgb urine dipstick 2+ (*)    Bacteria, UA MANY (*)    Squamous Epithelial / LPF 6-30 (*)    All other components within normal limits  ABO/RH     Radiology:   EXAM: LIMITED OBSTETRIC  ULTRASOUND  FINDINGS: Number of Fetuses: 1  Heart Rate: 158 bpm  Movement: Yes  Presentation: Cephalic  Placental Location: Posterior  Previa: No  Amniotic Fluid (Subjective): Within normal limits.  Femur length: 1.8cm 15w Jerryd  MATERNAL FINDINGS:  Cervix: Appears closed. Length is 3.0 cm.  Uterus/Adnexae: Not visualized  IMPRESSION: Single live intrauterine gestation with no acute findings  This exam is performed on an emergent basis and does not comprehensively evaluate fetal size, dating, or anatomy; follow-up complete OB US should be considered if further fetal assessment is warranted.    I personally reviewed the radiologic studies     ED Course:  Patient's stay here was uneventful and she had no further bleeding while here in emergency department. Her ultrasound shows a normal viable pregnancy at this point. I felt further laboratory work and studies were not necessary. Patient was advised that she should check with her employment to see if they have any available restrictions given her second trimester pregnancy. She was also advised to continue with her follow-up appointment with her OB/GYN who may be able to give her better answers about her work timetable should be. Patient was given a day off and advised drink plenty of fluids and take Tylenol for any discomfort. Her urine has some bacteria in it that seems to be a questionable sample given the amount of squamous epithelial cells in the sample. She does not have any urinary symptoms at this time.   Assessment:  Second trimester vaginal bleeding  Final Clinical Impression:  Final diagnoses:  Vaginal bleeding before [redacted] weeks gestation     Plan:  Outpatient management Patient was advised to return immediately if condition worsens. Patient was advised to follow up with her primary care physician or other specialized physicians involved and in their current  assessment.             Jennye Moccasin, MD 07/24/15 [phone]

## 2015-07-24 NOTE — ED Notes (Signed)
Pt reports she is [redacted] weeks pregnant and since noon of yesterday she has had continued bleeding. Pt denies abd pain at this time. First pregnancy.

## 2015-07-24 NOTE — ED Notes (Signed)
Pt states is 4 months pregnant, first pregnancy. Pt states last pm had vaginal bleeding that has abated since. Pt states "I just feel like i've been having a lot of anxiety, i just feel like i need to be on restrictions at work now." pt appears calm currently. Pt denies vaginal bleeding at this time. Pt denies pain at this time. Pt denies shob,, nausea, vomiting, diarrhea, diaphoresis, fever, dizziness, chills.

## 2015-08-04 ENCOUNTER — Other Ambulatory Visit: Payer: Self-pay | Admitting: Advanced Practice Midwife

## 2015-08-04 DIAGNOSIS — Z3402 Encounter for supervision of normal first pregnancy, second trimester: Secondary | ICD-10-CM

## 2015-08-15 ENCOUNTER — Emergency Department
Admission: EM | Admit: 2015-08-15 | Discharge: 2015-08-15 | Disposition: A | Discharge status: Home / Self Care | Payer: Medicaid Other | Attending: Emergency Medicine | Admitting: Emergency Medicine

## 2015-08-15 DIAGNOSIS — Z3A19 19 weeks gestation of pregnancy: Secondary | ICD-10-CM | POA: Insufficient documentation

## 2015-08-15 DIAGNOSIS — Z87891 Personal history of nicotine dependence: Secondary | ICD-10-CM | POA: Insufficient documentation

## 2015-08-15 DIAGNOSIS — J069 Acute upper respiratory infection, unspecified: Secondary | ICD-10-CM

## 2015-08-15 DIAGNOSIS — O99512 Diseases of the respiratory system complicating pregnancy, second trimester: Secondary | ICD-10-CM | POA: Diagnosis not present

## 2015-08-15 DIAGNOSIS — Z79899 Other long term (current) drug therapy: Secondary | ICD-10-CM | POA: Diagnosis not present

## 2015-08-15 DIAGNOSIS — O9989 Other specified diseases and conditions complicating pregnancy, childbirth and the puerperium: Secondary | ICD-10-CM | POA: Diagnosis present

## 2015-08-15 MED ORDER — GUAIFENESIN ER 600 MG PO TB12: 600.0000 mg | ORAL_TABLET | Freq: Two times a day (BID) | ORAL | Status: DC

## 2015-08-15 NOTE — ED Notes (Signed)
Patient comes with central chest pain for two days.  Patient reports pain with coughing.

## 2015-08-15 NOTE — Discharge Instructions (Signed)
Cool Mist Vaporizers °Vaporizers may help relieve the symptoms of a cough and cold. They add moisture to the air, which helps mucus to become thinner and less sticky. This makes it easier to breathe and cough up secretions. Cool mist vaporizers do not cause serious burns like hot mist vaporizers, which may also be called steamers or humidifiers. Vaporizers have not been proven to help with colds. You should not use a vaporizer if you are allergic to mold. °HOME CARE INSTRUCTIONS °· Follow the package instructions for the vaporizer. °· Do not use anything other than distilled water in the vaporizer. °· Do not run the vaporizer all of the time. This can cause mold or bacteria to grow in the vaporizer. °· Clean the vaporizer after each time it is used. °· Clean and dry the vaporizer well before storing it. °· Stop using the vaporizer if worsening respiratory symptoms develop. °  °This information is not intended to replace advice given to you by your health care provider. Make sure you discuss any questions you have with your health care provider. °  °Document Released: 07/05/2004 Document Revised: 10/13/2013 Document Reviewed: 02/25/2013 °Elsevier Interactive Patient Education ©2016 [employer] ° °Upper Respiratory Infection, Adult °Most upper respiratory infections (URIs) are a viral infection of the air passages leading to the lungs. A URI affects the nose, throat, and upper air passages. The most common type of URI is nasopharyngitis and is typically referred to as "the common cold." °URIs run their course and usually go away on their own. Most of the time, a URI does not require medical attention, but sometimes a bacterial infection in the upper airways can follow a viral infection. This is called a secondary infection. Sinus and middle ear infections are common types of secondary upper respiratory infections. °Bacterial pneumonia can also complicate a URI. A URI can worsen asthma and chronic obstructive  pulmonary disease (COPD). Sometimes, these complications can require emergency medical care and may be life threatening.  °CAUSES °Almost all URIs are caused by viruses. A virus is a type of germ and can spread from one person to another.  °RISKS FACTORS °You may be at risk for a URI if:  °· You smoke.   °· You have chronic heart or lung disease. °· You have a weakened defense (immune) system.   °· You are very young or very old.   °· You have nasal allergies or asthma. °· You work in crowded or poorly ventilated areas. °· You work in health care facilities or schools. °SIGNS AND SYMPTOMS  °Symptoms typically develop 2-3 days after you come in contact with a cold virus. Most viral URIs last 7-10 days. However, viral URIs from the influenza virus (flu virus) can last 14-18 days and are typically more severe. Symptoms may include:  °· Runny or stuffy (congested) nose.   °· Sneezing.   °· Cough.   °· Sore throat.   °· Headache.   °· Fatigue.   °· Fever.   °· Loss of appetite.   °· Pain in your forehead, behind your eyes, and over your cheekbones (sinus pain). °· Muscle aches.   °DIAGNOSIS  °Your health care provider may diagnose a URI by: °· Physical exam. °· Tests to check that your symptoms are not due to another condition such as: °¨ Strep throat. °¨ Sinusitis. °¨ Pneumonia. °¨ Asthma. °TREATMENT  °A URI goes away on its own with time. It cannot be cured with medicines, but medicines may be prescribed or recommended to relieve symptoms. Medicines may help: °· Reduce your fever. °· Reduce   your cough. °· Relieve nasal congestion. °HOME CARE INSTRUCTIONS  °· Take medicines only as directed by your health care provider.   °· Gargle warm saltwater or take cough drops to comfort your throat as directed by your health care provider. °· Use a warm mist humidifier or inhale steam from a shower to increase air moisture. This may make it easier to breathe. °· Drink enough fluid to keep your urine clear or pale yellow.   °· Eat  soups and other clear broths and maintain good nutrition.   °· Rest as needed.   °· Return to work when your temperature has returned to normal or as your health care provider advises. You may need to stay home longer to avoid infecting others. You can also use a face mask and careful hand washing to prevent spread of the virus. °· Increase the usage of your inhaler if you have asthma.   °· Do not use any tobacco products, including cigarettes, chewing tobacco, or electronic cigarettes. If you need help quitting, ask your health care provider. °PREVENTION  °The best way to protect yourself from getting a cold is to practice good hygiene.  °· Avoid oral or hand contact with people with cold symptoms.   °· Wash your hands often if contact occurs.   °There is no clear evidence that vitamin C, vitamin E, echinacea, or exercise reduces the chance of developing a cold. However, it is always recommended to get plenty of rest, exercise, and practice good nutrition.  °SEEK MEDICAL CARE IF:  °· You are getting worse rather than better.   °· Your symptoms are not controlled by medicine.   °· You have chills. °· You have worsening shortness of breath. °· You have brown or red mucus. °· You have yellow or brown nasal discharge. °· You have pain in your face, especially when you bend forward. °· You have a fever. °· You have swollen neck glands. °· You have pain while swallowing. °· You have white areas in the back of your throat. °SEEK IMMEDIATE MEDICAL CARE IF:  °· You have severe or persistent: °¨ Headache. °¨ Ear pain. °¨ Sinus pain. °¨ Chest pain. °· You have chronic lung disease and any of the following: °¨ Wheezing. °¨ Prolonged cough. °¨ Coughing up blood. °¨ A change in your usual mucus. °· You have a stiff neck. °· You have changes in your: °¨ Vision. °¨ Hearing. °¨ Thinking. °¨ Mood. °MAKE SURE YOU:  °· Understand these instructions. °· Will watch your condition. °· Will get help right away if you are not doing well or  get worse. °  °This information is not intended to replace advice given to you by your health care provider. Make sure you discuss any questions you have with your health care provider. °  °Document Released: 04/03/2001 Document Revised: 02/22/2015 Document Reviewed: 01/13/2014 °Elsevier Interactive Patient Education ©2016 [employer]vier Inc. ° °

## 2015-08-15 NOTE — ED Provider Notes (Signed)
-----------------------------------------   3:44 PM on 08/15/2015 -----------------------------------------  EKG reviewed and interpreted by myself shows normal sinus rhythm at 95 bpm, narrow QRS, normal axis, normal intervals, no ST changes noted. Overall normal EKG.  Minna AntisKevin Oneill Bais, MD 08/15/15 [id number]

## 2015-08-15 NOTE — ED Notes (Signed)
FHT 140

## 2015-08-22 ENCOUNTER — Ambulatory Visit
Admission: RE | Admit: 2015-08-22 | Discharge: 2015-08-22 | Disposition: A | Discharge status: Home / Self Care | Payer: Medicaid Other | Source: Ambulatory Visit | Attending: Advanced Practice Midwife | Admitting: Advanced Practice Midwife

## 2015-08-22 DIAGNOSIS — Z36 Encounter for antenatal screening of mother: Secondary | ICD-10-CM | POA: Diagnosis not present

## 2015-08-22 DIAGNOSIS — Z3A19 19 weeks gestation of pregnancy: Secondary | ICD-10-CM | POA: Insufficient documentation

## 2015-08-22 DIAGNOSIS — Z3402 Encounter for supervision of normal first pregnancy, second trimester: Secondary | ICD-10-CM

## 2015-09-04 NOTE — ED Provider Notes (Signed)
[facility] Emergency Department Provider Note ____________________________________________  Time seen: Approximately 4:02 PM  I have reviewed the triage vital signs and the nursing notes.   HISTORY  Chief Complaint Cough   HPI Clarene EssexHeidi M Mcclain is a 19 y.o. female who presents to the emergency department for evaluation of cough and central chest pain that has been present for the past 2 days. Pain is only with cough. She has not taken anything over-the-counter for cough. She is approximately [redacted] weeks pregnant. She denies abdominal pain, vaginal discharge, or bleeding.   History reviewed. No pertinent past medical history.  There are no active problems to display for this patient.   History reviewed. No pertinent past surgical history.  Current Outpatient Rx  Name  Route  Sig  Dispense  Refill  . guaiFENesin (MUCINEX) 600 MG 12 hr tablet   Oral   Take 1 tablet (600 mg total) by mouth 2 (two) times daily.   24 tablet   0   . metoCLOPramide (REGLAN) 5 MG tablet   Oral   Take 1 tablet (5 mg total) by mouth 3 (three) times daily.   15 tablet   1   . promethazine (PHENERGAN) 25 MG tablet   Oral   Take 1 tablet (25 mg total) by mouth every 6 (six) hours as needed for nausea or vomiting.   15 tablet   0     Allergies Review of patient's allergies indicates no known allergies.  History reviewed. No pertinent family history.  Social History Social History  Substance Use Topics  . Smoking status: Former Smoker -- 5.00 packs/day    Types: Cigarettes  . Smokeless tobacco: None  . Alcohol Use: No    Review of Systems Constitutional: No fever/chills Eyes: No visual changes. ENT: No sore throat. Cardiovascular: Positive for chest pain. Respiratory: Negative for shortness of breath. Positive for cough. Gastrointestinal: Negative abdominal pain. Negative for nausea,  negative for vomiting.  Negative for diarrhea.  Genitourinary: Negative for  dysuria. Musculoskeletal: Positive for body aches Skin: Negative for rash. Neurological: Negative for headaches, Negative for focal weakness or numbness.  10-point ROS otherwise negative.  ____________________________________________   PHYSICAL EXAM:  VITAL SIGNS: ED Triage Vitals  Enc Vitals Group     BP 08/15/15 1405 112/58 mmHg     Pulse Rate 08/15/15 1405 100     Resp 08/15/15 1405 18     Temp 08/15/15 1405 98.4 F (36.9 C)     Temp Source 08/15/15 1405 Oral     SpO2 08/15/15 1405 99 %     Weight 08/15/15 1405 146 lb (66.225 kg)     Height 08/15/15 1405 5\' 4"  (1.626 m)     Head Cir --      Peak Flow --      Pain Score 08/15/15 1405 4     Pain Loc --      Pain Edu? --      Excl. in GC? --     Constitutional: Alert and oriented. Well appearing and in no acute distress. Eyes: Conjunctivae are normal. PERRL. EOMI. Head: Atraumatic. Nose: No congestion/rhinnorhea. Mouth/Throat: Mucous membranes are moist.  Oropharynx non-erythematous. Neck: No stridor.  Lymphatic: No cervical lymphadenopathy. Cardiovascular: Normal rate, regular rhythm. Grossly normal heart sounds.  Good peripheral circulation. Respiratory: Normal respiratory effort.  No retractions. Lungs clear to auscultation. Gastrointestinal: Soft and nontender. No distention. No abdominal bruits. No CVA tenderness. Musculoskeletal: No joint pain reported. Neurologic:  Normal speech and  language. No gross focal neurologic deficits are appreciated. Speech is normal. No gait instability. Skin:  Skin is warm, dry and intact. No rash noted. Psychiatric: Mood and affect are normal. Speech and behavior are normal.  ____________________________________________   LABS (all labs ordered are listed, but only abnormal results are displayed)  Labs Reviewed - No data to display ____________________________________________  EKG  See M.D. note ____________________________________________  RADIOLOGY  Not  indicated ____________________________________________   PROCEDURES  Procedure(s) performed: None  Critical Care performed: No  ____________________________________________   INITIAL IMPRESSION / ASSESSMENT AND PLAN / ED COURSE  Pertinent labs & imaging results that were available during my care of the patient were reviewed by me and considered in my medical decision making (see chart for details).    Patient was advised to follow-up with her OB/GYN. She was advised to return to the emergency department for symptoms that change or worsen if simple schedule an appointment. She will begin taking Mucinex as prescribed. ____________________________________________   FINAL CLINICAL IMPRESSION(S) / ED DIAGNOSES  Final diagnoses:  Upper respiratory infection       Chinita Pester, FNP 09/04/15 1839  Minna Antis, MD 09/06/15 1435

## 2015-09-13 ENCOUNTER — Observation Stay
Admission: EM | Admit: 2015-09-13 | Discharge: 2015-09-14 | Disposition: A | Discharge status: Home / Self Care | Payer: Medicaid Other | Attending: Obstetrics and Gynecology | Admitting: Obstetrics and Gynecology

## 2015-09-13 ENCOUNTER — Inpatient Hospital Stay: Payer: Medicaid Other

## 2015-09-13 ENCOUNTER — Encounter: Payer: Self-pay | Admitting: *Deleted

## 2015-09-13 DIAGNOSIS — O4692 Antepartum hemorrhage, unspecified, second trimester: Principal | ICD-10-CM | POA: Insufficient documentation

## 2015-09-13 DIAGNOSIS — Z3A22 22 weeks gestation of pregnancy: Secondary | ICD-10-CM | POA: Insufficient documentation

## 2015-09-13 DIAGNOSIS — Z87891 Personal history of nicotine dependence: Secondary | ICD-10-CM | POA: Insufficient documentation

## 2015-09-13 DIAGNOSIS — O4690 Antepartum hemorrhage, unspecified, unspecified trimester: Secondary | ICD-10-CM | POA: Diagnosis present

## 2015-09-13 DIAGNOSIS — N939 Abnormal uterine and vaginal bleeding, unspecified: Secondary | ICD-10-CM | POA: Diagnosis present

## 2015-09-13 LAB — URINALYSIS COMPLETE WITH MICROSCOPIC (ARMC ONLY)
Bilirubin Urine: NEGATIVE
Glucose, UA: NEGATIVE mg/dL
Ketones, ur: NEGATIVE mg/dL
LEUKOCYTES UA: NEGATIVE
Nitrite: NEGATIVE
PH: 8 (ref 5.0–8.0)
PROTEIN: NEGATIVE mg/dL
SPECIFIC GRAVITY, URINE: 1.013 (ref 1.005–1.030)

## 2015-09-13 LAB — URINE DRUG SCREEN, QUALITATIVE (ARMC ONLY)
Amphetamines, Ur Screen: NOT DETECTED
BARBITURATES, UR SCREEN: NOT DETECTED
Benzodiazepine, Ur Scrn: NOT DETECTED
CANNABINOID 50 NG, UR ~~LOC~~: NOT DETECTED
Cocaine Metabolite,Ur ~~LOC~~: NOT DETECTED
MDMA (Ecstasy)Ur Screen: NOT DETECTED
Methadone Scn, Ur: NOT DETECTED
Opiate, Ur Screen: NOT DETECTED
Phencyclidine (PCP) Ur S: NOT DETECTED
TRICYCLIC, UR SCREEN: NOT DETECTED

## 2015-09-13 LAB — WET PREP, GENITAL
CLUE CELLS WET PREP: NONE SEEN
Sperm: NONE SEEN
TRICH WET PREP: NONE SEEN
Yeast Wet Prep HPF POC: NONE SEEN

## 2015-09-13 LAB — CHLAMYDIA/NGC RT PCR (ARMC ONLY)
Chlamydia Tr: NOT DETECTED
N gonorrhoeae: NOT DETECTED

## 2015-09-13 LAB — CBC
HCT: 34.1 % — ABNORMAL LOW (ref 35.0–47.0)
HEMOGLOBIN: 11.5 g/dL — AB (ref 12.0–16.0)
MCH: 30.3 pg (ref 26.0–34.0)
MCHC: 33.9 g/dL (ref 32.0–36.0)
MCV: 89.6 fL (ref 80.0–100.0)
Platelets: 203 10*3/uL (ref 150–440)
RBC: 3.8 MIL/uL (ref 3.80–5.20)
RDW: 12.9 % (ref 11.5–14.5)
WBC: 11.3 10*3/uL — ABNORMAL HIGH (ref 3.6–11.0)

## 2015-09-13 LAB — DIFFERENTIAL
Basophils Absolute: 0 10*3/uL (ref 0–0.1)
Basophils Relative: 0 %
EOS ABS: 0 10*3/uL (ref 0–0.7)
EOS PCT: 0 %
LYMPHS ABS: 2.1 10*3/uL (ref 1.0–3.6)
LYMPHS PCT: 19 %
MONO ABS: 0.6 10*3/uL (ref 0.2–0.9)
MONOS PCT: 5 %
NEUTROS PCT: 76 %
Neutro Abs: 8.5 10*3/uL — ABNORMAL HIGH (ref 1.4–6.5)

## 2015-09-13 LAB — TYPE AND SCREEN
ABO/RH(D): O POS
ANTIBODY SCREEN: NEGATIVE

## 2015-09-13 MED ORDER — CALCIUM CARBONATE ANTACID 500 MG PO CHEW: 2.0000 | CHEWABLE_TABLET | ORAL | Status: DC | PRN

## 2015-09-13 MED ORDER — BETAMETHASONE SOD PHOS & ACET 6 (3-3) MG/ML IJ SUSP
12.0000 mg | Freq: Once | INTRAMUSCULAR | Status: AC
  Administered 2015-09-13: 12 mg via INTRAMUSCULAR
  Filled 2015-09-13: qty 1

## 2015-09-13 MED ORDER — ZOLPIDEM TARTRATE 5 MG PO TABS
5.0000 mg | ORAL_TABLET | Freq: Every evening | ORAL | Status: DC | PRN
  Filled 2015-09-13: qty 1

## 2015-09-13 MED ORDER — DOCUSATE SODIUM 100 MG PO CAPS
100.0000 mg | ORAL_CAPSULE | Freq: Every day | ORAL | Status: DC
  Administered 2015-09-13: 100 mg via ORAL
  Filled 2015-09-13: qty 1

## 2015-09-13 MED ORDER — BETAMETHASONE SOD PHOS & ACET 6 (3-3) MG/ML IJ SUSP: 12.0000 mg | Freq: Once | INTRAMUSCULAR | Status: DC

## 2015-09-13 MED ORDER — PRENATAL MULTIVITAMIN CH
1.0000 | ORAL_TABLET | Freq: Every day | ORAL | Status: DC
  Administered 2015-09-13: 1 via ORAL
  Filled 2015-09-13 (×3): qty 1

## 2015-09-13 MED ORDER — ACETAMINOPHEN 325 MG PO TABS: 650.0000 mg | ORAL_TABLET | ORAL | Status: DC | PRN

## 2015-09-13 NOTE — H&P (Signed)
ANTEPARTUM ADMISSION HISTORY AND PHYSICAL NOTE   History of Present Illness: Kimberly Mosley is a 19 yo G1P0 at 22+5weeks by ultrasound at 15 weeks with an EDD of 01/12/16. She receives prenatal care at [facility]. She reports an uncomplicated pregnancy, but had late entry to prenatal care at 15 weeks. She reports significant medical history as anxiety. She presents to triage today with heavy, bright red vaginal bleeding that started around 7:30am. She reports not feeling well last night with some nausea, but has not had pain and woke up this morning to use the bathroom and found bright red blood in her pants. She denies abdominal pain, LOF, dysuria, abnormal discharge, HA, visual disturbances. She also denies tobacco use since she found out she was pregnant and she denies drug use during pregnancy. She reports her last intercourse as 2 weeks ago and denies any trauma or falls. She reports good fetal movement. I have reviewed labs and previous US and placenta location was posterior.   Patient Active Problem List   Diagnosis Date Noted  . Vaginal bleeding in pregnancy 09/13/2015    No past medical history on file.  No past surgical history on file.  OB History  Gravida Para Term Preterm AB SAB TAB Ectopic Multiple Living  1             # Outcome Date GA Lbr Len/2nd Weight Sex Delivery Anes PTL Lv  1 Current               Social History   Social History  . Marital Status: Single    Spouse Name: N/A  . Number of Children: N/A  . Years of Education: N/A   Social History Main Topics  . Smoking status: Former Smoker -- 5.00 packs/day    Types: Cigarettes  . Smokeless tobacco: Not on file  . Alcohol Use: No  . Drug Use: No  . Sexual Activity: Yes   Other Topics Concern  . Not on file   Social History Narrative    No family history on file.  No Known Allergies  Prescriptions prior to admission  Medication Sig Dispense Refill Last Dose  .  Prenatal Vit-Fe Fumarate-FA (MULTIVITAMIN-PRENATAL) 27-0.8 MG TABS tablet Take 1 tablet by mouth daily at 12 noon.     Marland Kitchen guaiFENesin (MUCINEX) 600 MG 12 hr tablet Take 1 tablet (600 mg total) by mouth 2 (two) times daily. 24 tablet 0   . metoCLOPramide (REGLAN) 5 MG tablet Take 1 tablet (5 mg total) by mouth 3 (three) times daily. 15 tablet 1   . promethazine (PHENERGAN) 25 MG tablet Take 1 tablet (25 mg total) by mouth every 6 (six) hours as needed for nausea or vomiting. 15 tablet 0    Review of Systems:  Constitutional: Negative for fever and chills.  Eyes: Negative for blurred vision.  Respiratory: Negative for cough, shortness of breath and wheezing.  Cardiovascular: Negative for chest pain and palpitations.  Gastrointestinal: Positive for nausea. Negative for heartburn, vomiting, diarrhea and constipation.   Some nausea last night, but has resolved  Genitourinary: Positive for vaginal bleeding. Negative for dysuria.  Musculoskeletal: Negative for back pain.  Neurological: Negative for headaches.    Vitals:  BP 119/72 mmHg  Pulse 95  Temp(Src) 98.1 F (36.7 C) (Oral)  Resp 18  LMP 02/28/2015 Physical Examination:  Constitutional: She is oriented to person, place, and time. She appears well-developed and well-nourished.  HENT:  Head: Normocephalic.  Cardiovascular: Normal rate and  regular rhythm.  Respiratory: Effort normal and breath sounds normal.  GI: Soft. Bowel sounds are normal. There is no tenderness. There is no rebound and no guarding.  Genitourinary:  Sterile Speculum Exam: Bright red vaginal bleeding in vaginal vault and active bleeding from cervical os / cervix appears closed on exam / no evidence of lesions on cervix / no clots noted  SVE: closed/thick  Neurological: She is alert and oriented to person, place, and time.  Skin: Skin is warm and dry.   FHR: 140 / Moderate variability  TOCO: UI  Labs:  Results for orders placed or performed during the  hospital encounter of 09/13/15 (from the past 24 hour(s))  Wet prep, genital   Collection Time: 09/13/15  8:56 AM  Result Value Ref Range   Yeast Wet Prep HPF POC NONE SEEN NONE SEEN   Trich, Wet Prep NONE SEEN NONE SEEN   Clue Cells Wet Prep HPF POC NONE SEEN NONE SEEN   WBC, Wet Prep HPF POC FEW (A) NONE SEEN   Sperm NONE SEEN   Chlamydia/NGC rt PCR ([facility] only)   Collection Time: 09/13/15  8:56 AM  Result Value Ref Range   Specimen source GC/Chlam ENDOCERVICAL    Chlamydia Tr NOT DETECTED NOT DETECTED   N gonorrhoeae NOT DETECTED NOT DETECTED  Urinalysis complete, with microscopic ([facility] only)   Collection Time: 09/13/15  8:56 AM  Result Value Ref Range   Color, Urine YELLOW (A) YELLOW   APPearance CLEAR (A) CLEAR   Glucose, UA NEGATIVE NEGATIVE mg/dL   Bilirubin Urine NEGATIVE NEGATIVE   Ketones, ur NEGATIVE NEGATIVE mg/dL   Specific Gravity, Urine 1.013 1.005 - 1.030   Hgb urine dipstick 3+ (A) NEGATIVE   pH 8.0 5.0 - 8.0   Protein, ur NEGATIVE NEGATIVE mg/dL   Nitrite NEGATIVE NEGATIVE   Leukocytes, UA NEGATIVE NEGATIVE   RBC / HPF TOO NUMEROUS TO COUNT 0 - 5 RBC/hpf   WBC, UA 0-5 0 - 5 WBC/hpf   Bacteria, UA RARE (A) NONE SEEN   Squamous Epithelial / LPF 0-5 (A) NONE SEEN   Mucous PRESENT   Urine Drug Screen, Qualitative ([facility] only)   Collection Time: 09/13/15  8:56 AM  Result Value Ref Range   Tricyclic, Ur Screen NONE DETECTED NONE DETECTED   Amphetamines, Ur Screen NONE DETECTED NONE DETECTED   MDMA (Ecstasy)Ur Screen NONE DETECTED NONE DETECTED   Cocaine Metabolite,Ur [place] NONE DETECTED NONE DETECTED   Opiate, Ur Screen NONE DETECTED NONE DETECTED   Phencyclidine (PCP) Ur S NONE DETECTED NONE DETECTED   Cannabinoid 50 Ng, Ur [place] NONE DETECTED NONE DETECTED   Barbiturates, Ur Screen NONE DETECTED NONE DETECTED   Benzodiazepine, Ur Scrn NONE DETECTED NONE DETECTED   Methadone Scn, Ur NONE DETECTED NONE DETECTED    Imaging Studies: [place] Ob Comp + 14 Wk  08/22/2015   CLINICAL DATA:  Pregnancy.  Size greater than dates. EXAM: OBSTETRIC 14+ WK ULTRASOUND FINDINGS: Number of Fetuses: 1 Heart Rate:  145 bpm Movement: Present Presentation: Variable Previa: No Placental Location: Posterior Amniotic Fluid (Subjective): Normal Amniotic Fluid (Objective): Vertical pocket 3.6cm FETAL BIOMETRY BPD:  4.4cm 19w 2d HC:    14.4cm  19w   6d AC:   14.4cm  19w   5d FL:   3.2cm  20w   tod Current Mean GA: 19w 6d              US EDC: 01/10/2016 FETAL ANATOMY Lateral Ventricles: Visualized Thalami/CSP: Visualized Posterior  Fossa:  Visualized Nuchal Region: Visualized    NFT= 3.32mm Upper Lip: Visualized Spine: Visualized 4 Chamber Heart on Left: Visualized LVOT: Visualized RVOT: Visualized Stomach on Left: Visualized 3 Vessel Cord: Visualized Cord Insertion site: Visualized Kidneys: Visualized Bladder: New visualized Extremities: Visualized Maternal Findings: Cervix:  3.1 cm and closed IMPRESSION: Single viable intrauterine pregnancy at 19 weeks 6 days. Electronically Signed   By: Maisie Fus  Register   On: 08/22/2015 15:45   US Ob Limited  09/13/2015  CLINICAL DATA:  Vaginal bleeding in pregnancy, second trimester O46.92 (ICD-10-CM) EXAM: LIMITED OBSTETRIC ULTRASOUND FINDINGS: Number of Fetuses: 1 Heart Rate:  136 bpm Movement: Yes Presentation: Breech Placental Location: Primarily posterior Previa: No Amniotic Fluid (Subjective):  Within normal limits. BPD:  5.2cm 21w  5d MATERNAL FINDINGS: Cervix:  Closed measuring 3.9 cm in length. Uterus/Adnexae:  No abnormality visualized. IMPRESSION: Single live intrauterine pregnancy with a measured gestational age of [redacted] weeks and 5 days. Biparietal diameter measures 1 week smaller than expected based on the patient's last ultrasound, dated 08/22/2015, where the biparietal diameter was 4.4 cm consistent with a 19 week 2 day gestation. Exam otherwise unremarkable. This exam is performed on an emergent basis and does not comprehensively evaluate fetal size,  dating, or anatomy; follow-up complete OB US should be considered if further fetal assessment is warranted. Electronically Signed   By: Amie Portland M.D.   On: 09/13/2015 10:54     Assessment and Plan: Patient Active Problem List   Diagnosis Date Noted  . Vaginal bleeding in pregnancy 09/13/2015   Admit to Antenatal Betamethasone x 2 doses Pad count Strict I&Os Continuous toco, doppler qshift If s/s of preterm labor at 22+6 weeks - transfer to tertiary center  Routine antenatal care  Dr. Dalbert Garnet aware and agrees with plan  Carlean Jews, CNM

## 2015-09-13 NOTE — Progress Notes (Signed)
While patient was at US patient states she felt the urge to pee and when up to the bathroom and when peeing she had a large amount of blood again.  Patient did have stained thighs again.

## 2015-09-13 NOTE — OB Triage Note (Signed)
Patient woke up this morning and felt like she needed to go "pee"  But couldn't and her pants and toliet were full of watery blood around 0730. dennes any other complaint, no pain.

## 2015-09-13 NOTE — OB Triage Provider Note (Signed)
History     CSN: [id number]  Arrival date and time: 09/13/15 0740   None     Chief Complaint  Patient presents with  . Vaginal Bleeding   Vaginal Bleeding Associated symptoms include nausea. Pertinent negatives include no back pain, chills, constipation, diarrhea, dysuria, fever, headaches or vomiting.  Kimberly Mosley is a 19 yo G1P0 at 22+5weeks by ultrasound at 15 weeks with an EDD of 01/12/16.  She receives prenatal care at [facility].  She reports an uncomplicated pregnancy, but had late entry to prenatal care at 15 weeks.  She reports significant medical history as anxiety.  She presents to triage today with heavy, bright red vaginal bleeding that started around 7:30am.  She reports not feeling well last night with some nausea, but has not had pain and woke up this morning to use the bathroom and found bright red blood in her pants.  She denies abdominal pain, LOF, dysuria, abnormal discharge, HA, visual disturbances.  She also denies tobacco use since she found out she was pregnant and she denies drug use during pregnancy.  She reports her last intercourse as 2 weeks ago and denies any trauma or falls. She reports good fetal movement. I have reviewed labs and previous US and placenta location was posterior.   OB History    Gravida Para Term Preterm AB TAB SAB Ectopic Multiple Living   1               No past medical history on file.  No past surgical history on file.  No family history on file.  Social History  Substance Use Topics  . Smoking status: Former Smoker -- 5.00 packs/day    Types: Cigarettes  . Smokeless tobacco: Not on file  . Alcohol Use: No    Allergies: No Known Allergies  Prescriptions prior to admission  Medication Sig Dispense Refill Last Dose  . Prenatal Vit-Fe Fumarate-FA (MULTIVITAMIN-PRENATAL) 27-0.8 MG TABS tablet Take 1 tablet by mouth daily at 12 noon.     Marland Kitchen guaiFENesin (MUCINEX) 600 MG 12 hr tablet Take 1 tablet (600 mg  total) by mouth 2 (two) times daily. 24 tablet 0   . metoCLOPramide (REGLAN) 5 MG tablet Take 1 tablet (5 mg total) by mouth 3 (three) times daily. 15 tablet 1   . promethazine (PHENERGAN) 25 MG tablet Take 1 tablet (25 mg total) by mouth every 6 (six) hours as needed for nausea or vomiting. 15 tablet 0     Review of Systems  Constitutional: Negative for fever and chills.  Eyes: Negative for blurred vision.  Respiratory: Negative for cough, shortness of breath and wheezing.   Cardiovascular: Negative for chest pain and palpitations.  Gastrointestinal: Positive for nausea. Negative for heartburn, vomiting, diarrhea and constipation.       Some nausea last night, but has resolved  Genitourinary: Positive for vaginal bleeding. Negative for dysuria.  Musculoskeletal: Negative for back pain.  Neurological: Negative for headaches.   Physical Exam   Blood pressure 117/60, pulse 91, temperature 98.1 F (36.7 C), temperature source Oral, resp. rate 18, last menstrual period 02/28/2015.  Physical Exam  Constitutional: She is oriented to person, place, and time. She appears well-developed and well-nourished.  HENT:  Head: Normocephalic.  Cardiovascular: Normal rate and regular rhythm.   Respiratory: Effort normal and breath sounds normal.  GI: Soft. Bowel sounds are normal. There is no tenderness. There is no rebound and no guarding.  Genitourinary:  Sterile Speculum Exam: Bright red  vaginal bleeding in vaginal vault and active bleeding from cervical os / cervix appears closed on exam / no evidence of lesions on cervix / no clots noted  SVE: closed/thick  Neurological: She is alert and oriented to person, place, and time.  Skin: Skin is warm and dry.   FHR: 140 / Moderate variability  TOCO: UI     Procedures    Assessment and Plan  IUP at 22+5 weeks Vaginal Bleeding affecting second trimester UA, UDS, GC/CT, Wet prep OB limited US Continuous monitoring   09/13/15 @1115  Per  RN still having bright red vaginal bleeding  OB limited US results: primarily posterior placenta, breech, normal AFI Admit to observation  Routine Antenatal orders Continuous toco, dopper qshift  Strict I&Os  Pad counts  BMZ x 2 doses   Dr. Dalbert GarnetBeasley aware of patient and agrees with plan of care Karena AddisonSigmon, Meredith C 09/13/2015, 9:01 AM

## 2015-09-14 ENCOUNTER — Inpatient Hospital Stay
Admission: RE | Admit: 2015-09-14 | Discharge: 2015-09-14 | Disposition: A | Discharge status: Home / Self Care | Payer: 59 | Attending: Obstetrics and Gynecology | Admitting: Obstetrics and Gynecology

## 2015-09-14 DIAGNOSIS — Z3A21 21 weeks gestation of pregnancy: Secondary | ICD-10-CM | POA: Insufficient documentation

## 2015-09-14 DIAGNOSIS — O4692 Antepartum hemorrhage, unspecified, second trimester: Secondary | ICD-10-CM | POA: Diagnosis present

## 2015-09-14 MED ORDER — BETAMETHASONE SOD PHOS & ACET 6 (3-3) MG/ML IJ SUSP
12.0000 mg | Freq: Once | INTRAMUSCULAR | Status: AC
  Administered 2015-09-14: 12 mg via INTRAMUSCULAR

## 2015-09-14 NOTE — Discharge Instructions (Signed)
Vaginal Bleeding During Pregnancy, Second Trimester ° A small amount of bleeding (spotting) from the vagina is common in pregnancy. Sometimes the bleeding is normal and is not a problem, and sometimes it is a sign of something serious. Be sure to tell your doctor about any bleeding from your vagina right away. °HOME CARE °· Watch your condition for any changes. °· Follow your doctor's instructions about how active you can be. °· If you are on bed rest: °· You may need to stay in bed and only get up to use the bathroom. °· You may be allowed to do some activities. °· If you need help, make plans for someone to help you. °· Write down: °· The number of pads you use each day. °· How often you change pads. °· How soaked (saturated) your pads are. °· Do not use tampons. °· Do not douche. °· Do not have sex or orgasms until your doctor says it is okay. °· If you pass any tissue from your vagina, save the tissue so you can show it to your doctor. °· Only take medicines as told by your doctor. °· Do not take aspirin because it can make you bleed. °· Do not exercise, lift heavy weights, or do any activities that take a lot of energy and effort unless your doctor says it is okay. °· Keep all follow-up visits as told by your doctor. °GET HELP IF:  °· You bleed from your vagina. °· You have cramps. °· You have labor pains. °· You have a fever that does not go away after you take medicine. °GET HELP RIGHT AWAY IF: °· You have very bad cramps in your back or belly (abdomen). °· You have contractions. °· You have chills. °· You pass large clots or tissue from your vagina. °· You bleed more. °· You feel light-headed or weak. °· You pass out (faint). °· You are leaking fluid or have a gush of fluid from your vagina. °MAKE SURE YOU: °· Understand these instructions. °· Will watch your condition. °· Will get help right away if you are not doing well or get worse. °  °This information is not intended to replace advice given to you by  your health care provider. Make sure you discuss any questions you have with your health care provider. °  °Document Released: 02/22/2014 Document Reviewed: 02/22/2014 °Elsevier Interactive Patient Education ©2016 Elsevier Inc. ° °Pelvic Rest °Pelvic rest is sometimes recommended for women when:  °· The placenta is partially or completely covering the opening of the cervix (placenta previa). °· There is bleeding between the uterine wall and the amniotic sac in the first trimester (subchorionic hemorrhage). °· The cervix begins to open without labor starting (incompetent cervix, cervical insufficiency). °· The labor is too early (preterm labor). °HOME CARE INSTRUCTIONS °· Do not have sexual intercourse, stimulation, or an orgasm. °· Do not use tampons, douche, or put anything in the vagina. °· Do not lift anything over 10 pounds (4.5 kg). °· Avoid strenuous activity or straining your pelvic muscles. °SEEK MEDICAL CARE IF:  °· You have any vaginal bleeding during pregnancy. Treat this as a potential emergency. °· You have cramping pain felt low in the stomach (stronger than menstrual cramps). °· You notice vaginal discharge (watery, mucus, or bloody). °· You have a low, dull backache. °· There are regular contractions or uterine tightening. °SEEK IMMEDIATE MEDICAL CARE IF: °You have vaginal bleeding and have placenta previa.  °  °This information is not intended to   replace advice given to you by your health care provider. Make sure you discuss any questions you have with your health care provider. °  °Document Released: 02/02/2011 Document Revised: 12/31/2011 Document Reviewed: 04/11/2015 °Elsevier Interactive Patient Education ©2016 Elsevier Inc. ° °

## 2015-09-14 NOTE — Discharge Summary (Signed)
Discharge instructions reviewed with patient including need for follow up appointment, activity limitations, pelvic rest, signs of preterm labor, and when to seek medical attention. All questions answered. Patient discharged via wheelchair in stable condition.

## 2015-09-14 NOTE — Progress Notes (Signed)
Patient arrived for second Betamethasone injection. Betamethasone given per MD order. Patient discharged in stable condition, ambulatory with steady gait, no signs or symptoms of distress observed. Patient denies concerns at this time.

## 2015-09-17 NOTE — Final Progress Note (Signed)
Physician Final Progress Note  Patient ID: Kimberly EssexHeidi M Mosley MRN: [id number] DOB/AGE: 21-05-1996 21 y.o.  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  Letter given for her to work in a different area and to avoid trauma to the abdomen/uterus.  No evidence of contractions overnight and active vaginal bleeding has stopped.   Significant Findings/ Diagnostic Studies:  US impression:  Single live intrauterine pregnancy with a measured gestational age of [redacted] weeks and 5 days. Biparietal diameter measures 1 week smaller than expected based on the patient's last ultrasound, dated 08/22/2015, where the biparietal diameter was 4.4 cm consistent with a 19 week 2 day gestation. Exam otherwise unremarkable.  This exam is performed on an emergent basis and does not comprehensively evaluate fetal size, dating, or anatomy; follow-up complete OB US should be considered if further fetal assessment is warranted.   Electronically Signed  By: Amie Portlandavid Ormond M.D.  On: 09/13/2015 10:54  Discharge Condition: good Pad counts: no saturation of pads overnight / scant amounts of brown/dark red blood on pads - no active bleeding SVE: closed/thick/ dark red-brown blood on glove after exam  TOCO: no contractions noted   Disposition: 01-Home or Self Care  Diet: Regular diet  Discharge Activity: Strict precautions given: Pelvic rest / no intercourse or anything in the vagina / out of work x 1 week - rest at home / No heavy lifting greater than 10 pounds/recommend working in a different area at [employer] to prevent hitting abdomen on drive-thru door /  follow-up at [facility] on Monday for exam  Preterm labor precautions given / Return for worsening vaginal bleeding, LOF, contractions  Return to [facility] for 2nd dose of BMZ at 1400 today F/U Monday at [facility] for evaluation and ob appointment     Medication List    ASK your doctor about these medications        multivitamin-prenatal 27-0.8 MG Tabs tablet  Take 1 tablet by mouth daily at 12 noon.         Signed: Karena AddisonSigmon, Adeleigh Barletta C 09/17/2015, 10:18 AM

## 2015-11-08 ENCOUNTER — Inpatient Hospital Stay
Admission: RE | Admit: 2015-11-08 | Discharge: 2015-11-08 | Disposition: A | Discharge status: Home / Self Care | Payer: 59 | Attending: Obstetrics and Gynecology | Admitting: Obstetrics and Gynecology

## 2015-11-08 ENCOUNTER — Encounter: Payer: Self-pay | Admitting: *Deleted

## 2015-11-08 DIAGNOSIS — Z87891 Personal history of nicotine dependence: Secondary | ICD-10-CM | POA: Diagnosis not present

## 2015-11-08 DIAGNOSIS — O368130 Decreased fetal movements, third trimester, not applicable or unspecified: Secondary | ICD-10-CM | POA: Insufficient documentation

## 2015-11-08 DIAGNOSIS — O26893 Other specified pregnancy related conditions, third trimester: Secondary | ICD-10-CM | POA: Diagnosis not present

## 2015-11-08 DIAGNOSIS — Z3A30 30 weeks gestation of pregnancy: Secondary | ICD-10-CM | POA: Insufficient documentation

## 2015-11-08 DIAGNOSIS — R300 Dysuria: Secondary | ICD-10-CM | POA: Insufficient documentation

## 2015-11-08 HISTORY — DX: Other specified health status: Z78.9

## 2015-11-08 LAB — URINALYSIS COMPLETE WITH MICROSCOPIC (ARMC ONLY)
Bilirubin Urine: NEGATIVE
Glucose, UA: NEGATIVE mg/dL
Hgb urine dipstick: NEGATIVE
Nitrite: NEGATIVE
PROTEIN: NEGATIVE mg/dL
Specific Gravity, Urine: 1.02 (ref 1.005–1.030)
pH: 6 (ref 5.0–8.0)

## 2015-11-08 MED ORDER — NITROFURANTOIN MONOHYD MACRO 100 MG PO CAPS: 100.0000 mg | ORAL_CAPSULE | Freq: Two times a day (BID) | ORAL | Status: DC

## 2015-11-08 NOTE — OB Triage Note (Signed)
Patient has not felt baby move since Monday 0000.

## 2015-11-08 NOTE — MAU Provider Note (Signed)
History     CSN: [id number]  Arrival date and time: 11/08/15 1747   None     Chief Complaint  Patient presents with  . Decreased Fetal Movement   HPI Kimberly Mosley is a 20 yo G1P0 at 30+5 weeks today by LMP of 6/ with an EDD of 01/12/16.  She receives prenatal care at [facility] and called the office this evening with c/o decreased fetal movement since Monday at 0000.  She states she was not sure what to do after not feeling him move, and was worried, so she called today.  She does report that she has felt him move a lot since admission.  She denies ctxs, LOF, VB.  She also states she is having urinary frequency, urgency with some burning and some left sided hip pain that radiates to her back.  OB History    Gravida Para Term Preterm AB TAB SAB Ectopic Multiple Living   1               Past Medical History  Diagnosis Date  . Medical history non-contributory     Past Surgical History  Procedure Laterality Date  . No past surgeries      History reviewed. No pertinent family history.  Social History  Substance Use Topics  . Smoking status: Former Smoker -- 5.00 packs/day    Types: Cigarettes  . Smokeless tobacco: Former Neurosurgeon  . Alcohol Use: No    Allergies: No Known Allergies  Prescriptions prior to admission  Medication Sig Dispense Refill Last Dose  . Prenatal Vit-Fe Fumarate-FA (MULTIVITAMIN-PRENATAL) 27-0.8 MG TABS tablet Take 1 tablet by mouth daily at 12 noon.       Review of Systems  Constitutional: Negative.   HENT: Negative.   Eyes: Negative.   Respiratory: Negative.   Cardiovascular: Negative.   Gastrointestinal: Negative.   Genitourinary: Positive for dysuria, urgency and frequency.  Musculoskeletal: Positive for back pain.       Left hip back  Skin: Negative.   Neurological: Negative.   Endo/Heme/Allergies: Negative.   Psychiatric/Behavioral: Negative.    Physical Exam   Blood pressure 112/66, pulse 79, temperature 98.2 F (36.8 C),  temperature source Oral, resp. rate 18, last menstrual period 02/28/2015.  Physical Exam  Constitutional: She is oriented to person, place, and time. She appears well-developed and well-nourished.  HENT:  Head: Normocephalic.  Neck: Normal range of motion.  Cardiovascular: Normal rate and regular rhythm.   Respiratory: Effort normal and breath sounds normal.  GI: Soft. Bowel sounds are normal.  Genitourinary: Uterus normal.  Gravid, non-tender, fetal movement palpated   Neurological: She is alert and oriented to person, place, and time.  Skin: Skin is warm and dry.  Dilation: Closed Effacement (%): Thick Exam by:: M. Sigmon, CNM  Fetal monitoring: Baseline: 135bpm / moderate variability/ +accels/ no decels Toco: no contractions noted  Results for Kimberly Mosley (MRN [id number]) as of 11/08/2015 20:53  Ref. Range 11/08/2015 18:53  Appearance Latest Ref Range: CLEAR  CLOUDY (A)  Bacteria, UA Latest Ref Range: NONE SEEN  MANY (A)  Bilirubin Urine Latest Ref Range: NEGATIVE  NEGATIVE  Color, Urine Latest Ref Range: YELLOW  YELLOW (A)  Glucose Latest Ref Range: NEGATIVE mg/dL NEGATIVE  Hgb urine dipstick Latest Ref Range: NEGATIVE  NEGATIVE  Ketones, ur Latest Ref Range: NEGATIVE mg/dL 2+ (A)  Leukocytes, UA Latest Ref Range: NEGATIVE  2+ (A)  Mucous Unknown PRESENT  Nitrite Latest Ref Range: NEGATIVE  NEGATIVE  pH  Latest Ref Range: 5.0-8.0  6.0  Protein Latest Ref Range: NEGATIVE mg/dL NEGATIVE  RBC / HPF Latest Ref Range: 0-5 RBC/hpf 6-30  Specific Gravity, Urine Latest Ref Range: 1.005-1.030  1.020  Squamous Epithelial / LPF Latest Ref Range: NONE SEEN  0-5 (A)  WBC, UA Latest Ref Range: 0-5 WBC/hpf TOO NUMEROUS TO C...    Course  Procedures    Assessment and Plan  IUP at 30+5 weeks Decreased Fetal Movement - movement felt since admission Category 1 Fetal Tracing Dysuria/Urgency/Frequency   D/C Home Category 1 Fetal Tracing Strict Fetal Kick Counts - handout  given Strict Preterm labor precautions Begin Macrobid  BID x 7 days - urine culture pending Recommend Tylenol, heat, ice to left hip for sciatica  Call with worsening s/s  F/U at [facility] on 11/16/15  Dr. Dalbert Garnet aware and agrees with plan of care  Karena Addison 11/08/2015, 7:39 PM

## 2015-11-08 NOTE — Progress Notes (Signed)
Discharge instructions and reviewed with patient who verbalized understanding.  Patient discharged home in stable ambulatory condition.

## 2015-11-10 LAB — URINE CULTURE

## 2016-01-07 ENCOUNTER — Inpatient Hospital Stay
Admission: EM | Admit: 2016-01-07 | Discharge: 2016-01-08 | Disposition: A | Discharge status: Home / Self Care | Payer: 59 | Attending: Obstetrics and Gynecology | Admitting: Obstetrics and Gynecology

## 2016-01-07 DIAGNOSIS — Z3A39 39 weeks gestation of pregnancy: Secondary | ICD-10-CM | POA: Insufficient documentation

## 2016-01-08 DIAGNOSIS — Z3A39 39 weeks gestation of pregnancy: Secondary | ICD-10-CM | POA: Diagnosis not present

## 2016-01-08 NOTE — Final Progress Note (Signed)
Not in labor, discharged from triage with reactive strip.

## 2016-01-08 NOTE — OB Triage Provider Note (Signed)
TRIAGE VISIT with NST   Kimberly Mosley is a 20 y.o. G1P0. She is at 7189w3d gestation, presenting with signs of labor.  Indication: Contractions  S: Resting comfortably. No CTX, no VB. Active fetal movement. Concerned about LOF O:  Temp(Src) 98 F (36.7 C) (Oral)  Resp 20  Ht 5\' 3"  (1.6 m)  Wt 72.576 kg (160 lb)  BMI 28.35 kg/m2  LMP 02/28/2015 No results found for this or any previous visit (from the past 48 hour(s)).   Gen: NAD, AAOx3      Abd: FNTTP      Ext: Non-tender, Nonedmeatous    FHT: 120, mod var, +accels, no decels TOCO: quiet SVE:  closed/thick and high  NST: Reactive. See FHT above for particulars.  A/P:  20 y.o. G1P0 2189w3d with LOF and contractions.   Labor: not present.   R/o ROM: SSE negative x 3.   Fetal Wellbeing: Reassuring Cat 1 tracing.  D/c home stable, precautions reviewed, follow-up as scheduled.

## 2016-01-08 NOTE — OB Triage Note (Signed)
Pt c/o leaking fluid from this morning.  Was seen in office on Thursday with closed cervix.  Increased bowel movements today.

## 2016-01-13 ENCOUNTER — Other Ambulatory Visit: Payer: Self-pay | Admitting: Obstetrics and Gynecology

## 2016-01-13 NOTE — H&P (Signed)
  OB ADMISSION/ HISTORY & PHYSICAL:  Admission Date: No admission date for patient encounter.  Admit Diagnosis: Induction of labor at 40+2 weeks for postdates   Kimberly Mosley is a 20 y.o. female presenting for elective induction of labor at 40+2 weeks for postdates  Prenatal History: G1P0   LMP: 04/06/16 EDC : 01/12/2016, by Ultrasound  Prenatal care at [place] Prenatal course complicated by late entry to prenatal care at 15 weeks, Rubella Non-immune, spontaneous, painless vaginal bleeding at 21 weeks, resolved  Prenatal Labs: ABO, Rh: --/--/O POS (11/22 1235) Antibody: NEG (11/22 1235) Rubella:   Non-Immune Varicella: Immune RPR:   NR HBsAg:   Negative HIV:   Negative GTT: 107 GBS:   Negative   Medical / Surgical History :  Past medical history:  Past Medical History  Diagnosis Date  . Medical history non-contributory      Past surgical history:  Past Surgical History  Procedure Laterality Date  . No past surgeries      Family History: No family history on file.   Social History:  reports that she has quit smoking. Her smoking use included Cigarettes. She smoked 5.00 packs per day. She has quit using smokeless tobacco. She reports that she does not drink alcohol or use illicit drugs.   Allergies: Review of patient's allergies indicates no known allergies.    Current Medications at time of admission:  Prior to Admission medications   Medication Sig Start Date End Date Taking? Authorizing Provider  nitrofurantoin, macrocrystal-monohydrate, (MACROBID) 100 MG capsule Take 1 capsule (100 mg total) by mouth 2 (two) times daily. Patient not taking: Reported on 01/08/2016 11/08/15   Darliss Cheney, CNM  Prenatal Vit-Fe Fumarate-FA (MULTIVITAMIN-PRENATAL) 27-0.8 MG TABS tablet Take 1 tablet by mouth daily at 12 noon.    Historical Provider, MD     Review of Systems: Active FM Irregular ctxs No LOF No bloody show   Physical Exam:  VS: Last  menstrual period 02/28/2015.  General: alert and oriented, appears  Heart: RRR Lungs: Clear lung fields Abdomen: Gravid, soft and non-tender, non-distended / uterus: gravid, non-tender Extremities: no edema  Genitalia / VE:  2cm/80%/-2/vtx/BBOW  FHR:  TOCO:   Assessment: 40+[redacted] weeks gestation IOL stage of labor FHR category    Plan:  1. Admit to Birth Place  -Routine Labor and delivery orders  -Begin Pitocin  At 1 milliunit and increase by 2 milliunits   -May have Stadol '1mg'$  IVP every hour PRN for pain  -May have epidural upon request  -GBS Negative - no prophylaxis indicated 2. Rubella Non-Immune  -Administer MMR vaccine PP 3. Contraception  -planning Depo vs. Nexplanon  4. Anticipate NSVD  Dr. Leafy Ro notified of admission / plan of care  [place], CNM  Glenis Smoker, CNM

## 2016-03-20 ENCOUNTER — Encounter: Payer: Self-pay | Admitting: Medical Oncology

## 2016-03-20 ENCOUNTER — Emergency Department
Admission: EM | Admit: 2016-03-20 | Discharge: 2016-03-20 | Disposition: A | Discharge status: Home / Self Care | Payer: Medicaid Other | Attending: Emergency Medicine | Admitting: Emergency Medicine

## 2016-03-20 DIAGNOSIS — H9201 Otalgia, right ear: Secondary | ICD-10-CM | POA: Diagnosis present

## 2016-03-20 DIAGNOSIS — H6123 Impacted cerumen, bilateral: Secondary | ICD-10-CM | POA: Diagnosis not present

## 2016-03-20 DIAGNOSIS — Z87891 Personal history of nicotine dependence: Secondary | ICD-10-CM | POA: Diagnosis not present

## 2016-03-20 MED ORDER — CIPROFLOXACIN-HYDROCORTISONE 0.2-1 % OT SUSP
4.0000 [drp] | Freq: Two times a day (BID) | OTIC | Status: AC
Start: 2016-03-20 — End: 2016-03-27

## 2016-03-20 NOTE — ED Notes (Signed)
Pt reports rt ear "fullness".

## 2016-03-20 NOTE — Discharge Instructions (Signed)
Cerumen Impaction The structures of the external ear canal secrete a waxy substance known as cerumen. Excess cerumen can build up in the ear canal, causing a condition known as cerumen impaction. Cerumen impaction can cause ear pain and disrupt the function of the ear. The rate of cerumen production differs for each individual. In certain individuals, the configuration of the ear canal may decrease his or her ability to naturally remove cerumen. CAUSES Cerumen impaction is caused by excessive cerumen production or buildup. RISK FACTORS  Frequent use of swabs to clean ears.  Having narrow ear canals.  Having eczema.  Being dehydrated. SIGNS AND SYMPTOMS  Diminished hearing.  Ear drainage.  Ear pain.  Ear itch. TREATMENT Treatment may involve:  Over-the-counter or prescription ear drops to soften the cerumen.  Removal of cerumen by a health care provider. This may be done with:  Irrigation with warm water. This is the most common method of removal.  Ear curettes and other instruments.  Surgery. This may be done in severe cases. HOME CARE INSTRUCTIONS  Take medicines only as directed by your health care provider.  Do not insert objects into the ear with the intent of cleaning the ear. PREVENTION  Do not insert objects into the ear, even with the intent of cleaning the ear. Removing cerumen as a part of normal hygiene is not necessary, and the use of swabs in the ear canal is not recommended.  Drink enough water to keep your urine clear or pale yellow.  Control your eczema if you have it. SEEK MEDICAL CARE IF:  You develop ear pain.  You develop bleeding from the ear.  The cerumen does not clear after you use ear drops as directed.   This information is not intended to replace advice given to you by your health care provider. Make sure you discuss any questions you have with your health care provider.   Document Released: 11/15/2004 Document Revised: 10/29/2014  Document Reviewed: 05/25/2015 Elsevier Interactive Patient Education 2016 Elsevier Inc.  

## 2016-03-20 NOTE — ED Provider Notes (Signed)
[facility] Emergency Department Provider Note  ____________________________________________  Time seen: Approximately 4:26 PM  I have reviewed the triage vital signs and the nursing notes.   HISTORY  Chief Complaint Otalgia    HPI Kimberly Mosley is a 20 y.o. female presents for evaluation of right ear pain and fullness for the last few days. States that she can't hear out of her right ear. Denies any trauma and has had a runny nose recently   Past Medical History  Diagnosis Date  . Medical history non-contributory     Patient Active Problem List   Diagnosis Date Noted  . Vaginal bleeding in pregnancy 09/13/2015    Past Surgical History  Procedure Laterality Date  . No past surgeries      No current outpatient prescriptions on file.  Allergies Review of patient's allergies indicates no known allergies.  No family history on file.  Social History Social History  Substance Use Topics  . Smoking status: Former Smoker -- 5.00 packs/day    Types: Cigarettes  . Smokeless tobacco: Former Neurosurgeon  . Alcohol Use: No    Review of Systems Constitutional: No fever/chills Eyes: No visual changes. ENT: Positive right ear fullness and hearing loss. Positive runny nose. Cardiovascular: Denies chest pain. Respiratory: Denies shortness of breath. Musculoskeletal: Negative for back pain. Skin: Negative for rash. Neurological: Negative for headaches, focal weakness or numbness.  10-point ROS otherwise negative.  ____________________________________________   PHYSICAL EXAM:  VITAL SIGNS: ED Triage Vitals  Enc Vitals Group     BP 03/20/16 1604 126/70 mmHg     Pulse Rate 03/20/16 1604 77     Resp 03/20/16 1604 16     Temp 03/20/16 1604 98.4 F (36.9 C)     Temp Source 03/20/16 1604 Oral     SpO2 03/20/16 1604 98 %     Weight 03/20/16 1604 150 lb (68.04 kg)     Height 03/20/16 1604  (1.6 m)     Head Cir --      Peak Flow --      Pain  Score 03/20/16 1603 7     Pain Loc --      Pain Edu? --      Excl. in GC? --     Constitutional: Alert and oriented. Well appearing and in no acute distress. Eyes: Conjunctivae are normal. PERRL. EOMI. Head: Atraumatic.Cerumen impaction bilaterally right worse than left. Copious amounts of earwax removed with curette. Nose: Mild congestion/rhinnorhea. Mouth/Throat: Mucous membranes are moist.  Oropharynx non-erythematous. Neck: No stridor.  Full range of motion nontender Cardiovascular: Normal rate, regular rhythm. Grossly normal heart sounds.  Good peripheral circulation. Respiratory: Normal respiratory effort.  No retractions. Lungs CTAB. Musculoskeletal: No lower extremity tenderness nor edema.  No joint effusions. Neurologic:  Normal speech and language. No gross focal neurologic deficits are appreciated. No gait instability. Skin:  Skin is warm, dry and intact. No rash noted. Psychiatric: Mood and affect are normal. Speech and behavior are normal.  ____________________________________________   LABS (all labs ordered are listed, but only abnormal results are displayed)  Labs Reviewed - No data to display ____________________________________________    PROCEDURES  Procedure(s) performed: Yes  Bilateral ear irrigation with warm water and hydrogen peroxide.  Critical Care performed: No  ____________________________________________   INITIAL IMPRESSION / ASSESSMENT AND PLAN / ED COURSE  Pertinent labs & imaging results that were available during my care of the patient were reviewed by me and considered in my medical  decision making (see chart for details).  Acute bilateral cerumen impaction. Ear irrigation completed bilaterally. Patient encouraged to follow up with ENT for further evaluation of continued hearing loss. ____________________________________________   FINAL CLINICAL IMPRESSION(S) / ED DIAGNOSES  Final diagnoses:  Cerumen impaction, bilateral      This chart was dictated using voice recognition software/Dragon. Despite best efforts to proofread, errors can occur which can change the meaning. Any change was purely unintentional.   Evangeline Dakinharles M Beers, PA-C 03/20/16 1700  Maurilio LovelyNoelle McLaurin, MD 03/20/16 [id number]

## 2016-03-20 NOTE — ED Notes (Signed)
Right ear pain and fullness for a few days  No drainage noted or fever

## 2016-04-19 ENCOUNTER — Encounter: Payer: Self-pay | Admitting: Emergency Medicine

## 2016-04-19 ENCOUNTER — Emergency Department
Admission: EM | Admit: 2016-04-19 | Discharge: 2016-04-19 | Disposition: A | Discharge status: Home / Self Care | Payer: Medicaid Other | Attending: Emergency Medicine | Admitting: Emergency Medicine

## 2016-04-19 DIAGNOSIS — Z87891 Personal history of nicotine dependence: Secondary | ICD-10-CM | POA: Insufficient documentation

## 2016-04-19 DIAGNOSIS — Z87898 Personal history of other specified conditions: Secondary | ICD-10-CM | POA: Insufficient documentation

## 2016-04-19 DIAGNOSIS — H9201 Otalgia, right ear: Secondary | ICD-10-CM | POA: Insufficient documentation

## 2016-04-19 MED ORDER — NEOMYCIN-POLYMYXIN-HC 3.5-10000-1 OT SOLN: 3.0000 [drp] | Freq: Three times a day (TID) | OTIC | Status: DC

## 2016-04-19 NOTE — Discharge Instructions (Signed)
Take antibiotics as directed and discontinue once ear pain resolves.  Follow up with ENT physician for any concerns.

## 2016-04-19 NOTE — ED Provider Notes (Signed)
[facility] Emergency Department Provider Note  ____________________________________________  Time seen: Approximately 6:10 PM  I have reviewed the triage vital signs and the nursing notes.   HISTORY  Chief Complaint Otalgia    HPI Kimberly Mosley is a 20 y.o. female with acute onset ofright ear pain, starting last night. Continues. Similar symptoms a month ago resolved with antibiotics. History of tubes as a child. Also has a headache. No sore throat or cough. Minimal congestion. No recent swimming in a lake.   Past Medical History  Diagnosis Date  . Medical history non-contributory     Patient Active Problem List   Diagnosis Date Noted  . Vaginal bleeding in pregnancy 09/13/2015    Past Surgical History  Procedure Laterality Date  . No past surgeries      Current Outpatient Rx  Name  Route  Sig  Dispense  Refill  . neomycin-polymyxin-hydrocortisone (CORTISPORIN) otic solution   Right Ear   Place 3 drops into the right ear 3 (three) times daily.   10 mL   0     Allergies Review of patient's allergies indicates no known allergies.  History reviewed. No pertinent family history.  Social History Social History  Substance Use Topics  . Smoking status: Former Smoker -- 5.00 packs/day    Types: Cigarettes  . Smokeless tobacco: Former NeurosurgeonUser  . Alcohol Use: No    Review of Systems Constitutional: No fever/chills Eyes: No visual changes. ENT: No sore throat. Cardiovascular: Denies chest pain. Respiratory: Denies shortness of breath. Gastrointestinal: No abdominal pain.  No nausea, no vomiting.   Skin: Negative for rash. Neurological: Negative for headaches, focal weakness or numbness. 10-point ROS otherwise negative.  ____________________________________________   PHYSICAL EXAM:  VITAL SIGNS: ED Triage Vitals  Enc Vitals Group     BP 04/19/16 1733 112/65 mmHg     Pulse Rate 04/19/16 1733 88     Resp 04/19/16 1733 15   Temp 04/19/16 1733 99 F (37.2 C)     Temp Source 04/19/16 1733 Oral     SpO2 04/19/16 1733 97 %     Weight 04/19/16 1733 140 lb (63.504 kg)     Height 04/19/16 1733 5\' 2"  (1.575 m)     Head Cir --      Peak Flow --      Pain Score 04/19/16 1736 7     Pain Loc --      Pain Edu? --      Excl. in GC? --     Constitutional: Alert and oriented. Well appearing and in no acute distress. Eyes: Conjunctivae are normal. PERRL. EOMI. Ears:  Mild erythema to the right TM with mild drainage inside the ear canal. Earlobe is nontender. Also some haziness of the TM, dull appearance Head: Atraumatic. Nose: No congestion/rhinnorhea. Mouth/Throat: Mucous membranes are moist.  Oropharynx non-erythematous. No lesions. Neck:  Supple.  No adenopathy.   Cardiovascular: Normal rate, regular rhythm. Grossly normal heart sounds.  Good peripheral circulation. Respiratory: Normal respiratory effort.  No retractions. Lungs CTAB. Neurologic:  Normal speech and language. No gross focal neurologic deficits are appreciated. No gait instability. Skin:  Skin is warm, dry and intact. No rash noted. Psychiatric: Mood and affect are normal. Speech and behavior are normal.  ____________________________________________   LABS (all labs ordered are listed, but only abnormal results are displayed)  Labs Reviewed - No data to display ____________________________________________  EKG   ____________________________________________  RADIOLOGY   ____________________________________________   PROCEDURES  Procedure(s) performed: None  Critical Care performed: No  ____________________________________________   INITIAL IMPRESSION / ASSESSMENT AND PLAN / ED COURSE  Pertinent labs & imaging results that were available during my care of the patient were reviewed by me and considered in my medical decision making (see chart for details).  20 year old acute onset of right ear pain with mild irritation in the ear  canal. Possible early otitis externa, versus eustachian tube dysfunction versus early otitis media. She has already started on amoxicillin she had at home.. She may continue this. Also given a few days of Cortisporin Otic. Recommend follow-up with ENT if not improving. Continue over-the-counter pain medicine as needed. ____________________________________________   FINAL CLINICAL IMPRESSION(S) / ED DIAGNOSES  Final diagnoses:  Otalgia of right ear      Ignacia BayleyRobert Clark Clowdus, PA-C 04/19/16 1832  Phineas SemenGraydon Goodman, MD 04/19/16 [phone]

## 2016-04-19 NOTE — ED Notes (Signed)
Pt reports she was in the er one month ago for right ear pain (states they cleaned her ear out and she took the medication rx'd) - Last night she reports that her right ear began to hurt again and it is causing her head to hurt

## 2016-04-19 NOTE — ED Notes (Signed)
Pt c/o rt ear pain. States was seen here for it approx 1 month ago. Pt states clear drainage at this time. NAD noted. Pt denies head injury at this time. Pt states was given amoxicillin but did not finish it.

## 2016-05-04 ENCOUNTER — Encounter (HOSPITAL_BASED_OUTPATIENT_CLINIC_OR_DEPARTMENT_OTHER): Payer: Self-pay | Admitting: *Deleted

## 2016-05-04 ENCOUNTER — Emergency Department (HOSPITAL_BASED_OUTPATIENT_CLINIC_OR_DEPARTMENT_OTHER)
Admission: EM | Admit: 2016-05-04 | Discharge: 2016-05-04 | Disposition: A | Discharge status: Home / Self Care | Payer: Medicaid Other | Attending: Emergency Medicine | Admitting: Emergency Medicine

## 2016-05-04 DIAGNOSIS — G510 Bell's palsy: Secondary | ICD-10-CM | POA: Diagnosis not present

## 2016-05-04 DIAGNOSIS — Z87891 Personal history of nicotine dependence: Secondary | ICD-10-CM | POA: Insufficient documentation

## 2016-05-04 DIAGNOSIS — R200 Anesthesia of skin: Secondary | ICD-10-CM | POA: Diagnosis present

## 2016-05-04 MED ORDER — HYPROMELLOSE (GONIOSCOPIC) 2.5 % OP SOLN: 1.0000 [drp] | OPHTHALMIC | Status: DC

## 2016-05-04 MED ORDER — PREDNISONE 20 MG PO TABS: 60.0000 mg | ORAL_TABLET | Freq: Every day | ORAL | Status: DC

## 2016-05-04 MED ORDER — VALACYCLOVIR HCL 1 G PO TABS: 1000.0000 mg | ORAL_TABLET | Freq: Three times a day (TID) | ORAL | Status: DC

## 2016-05-04 NOTE — Discharge Instructions (Signed)
Take your medications as prescribed. Follow-up with an ophthalmologist for further monitoring and evaluation of your right eye due to your Bell's palsy. I also recommend following up with the ENT clinic listed above for further management and evaluation of your right ear pain and chronic ear infections. Return to the emergency department if symptoms worsen or new onset of fever, headache, neck stiffness, visual changes, slurred speech, difficulty swallowing, difficulty breathing, new numbness or tingling, weakness.

## 2016-05-04 NOTE — ED Provider Notes (Signed)
CSN: [id number]     Arrival date & time 05/04/16  1834 History  By signing my name below, I, Soijett Blue, attest that this documentation has been prepared under the direction and in the presence of Melburn HakeNicole Nadeau, PA-C Electronically Signed: Soijett Blue, ED Scribe. 05/04/2016. 8:16 PM.   Chief Complaint  Patient presents with  . Otalgia      The history is provided by the patient. No language interpreter was used.    Kimberly Mosley is a 20 y.o. female who presents to the Emergency Department complaining of right sided facial numbness and weakness, onset 5 days. She also reports having right ear pain onset 1 month. Pt notes that she was seen in the ED 2 weeks ago for right ear pain and Rx eardrops for her symptoms. Pt reports that her symptoms began following beginning the ear drops. Denies having right ear pain at this time. Pt reports that her symptoms began with right sided facial numbness, followed by right sided facial weakness. Pt is having associated symptoms of decreased hearing to right ear, resolved right ear drainage, and watery right eye. She notes that she has not tried any medications for the relief of her symptoms and stopped using her otic abx. She denies HA, fever, vision change, SOB, abdominal pain, n/v, numbness to extremities, tingling to extremities, weakness to extremities, and any other symptoms.    Per pt chart review: [facility]19/2016 for right ear pain. Pt had amoxicillin at home that she had already began taking and was informed that she could continue taking the medication. Pt was Rx cortisporin otic for their symptoms. Pt advised to follow up with ENT.  Pt denies following up with ENT regarding recurrent ear infections.   Past Medical History  Diagnosis Date  . Medical history non-contributory    Past Surgical History  Procedure Laterality Date  . No past surgeries     No family history on file. Social History  Substance Use Topics  .  Smoking status: Former Smoker -- 5.00 packs/day    Types: Cigarettes  . Smokeless tobacco: Former NeurosurgeonUser  . Alcohol Use: No   OB History    Gravida Para Term Preterm AB TAB SAB Ectopic Multiple Living   1              Review of Systems  Constitutional: Negative for fever.  HENT: Positive for ear discharge (resolved) and ear pain.        Right ear with decreased hearing  Eyes: Negative for visual disturbance.       Watery right eye  Respiratory: Negative for shortness of breath.   Gastrointestinal: Negative for nausea, vomiting and abdominal pain.  Neurological: Positive for numbness (right sided facial ). Negative for weakness and headaches.       No tingling      Allergies  Review of patient's allergies indicates no known allergies.  Home Medications   Prior to Admission medications   Medication Sig Start Date End Date Taking? Authorizing Provider  hydroxypropyl methylcellulose / hypromellose (ISOPTO TEARS / GONIOVISC) 2.5 % ophthalmic solution Place 1 drop into the right eye every hour while awake. 05/04/16   Barrett HenleNicole Elizabeth Nadeau, PA-C  neomycin-polymyxin-hydrocortisone (CORTISPORIN) otic solution Place 3 drops into the right ear 3 (three) times daily. 04/19/16   Ignacia Bayleyobert Tumey, PA-C  predniSONE (DELTASONE) 20 MG tablet Take 3 tablets (60 mg total) by mouth daily. 05/04/16   Barrett HenleNicole Elizabeth Nadeau, PA-C  valACYclovir (VALTREX) 1000 MG tablet Take 1 tablet (1,000 mg total) by mouth 3 (three) times daily. 05/04/16   Satira Sark Nadeau, PA-C   BP 111/58 mmHg  Pulse 63  Temp(Src) 97.3 F (36.3 C) (Oral)  Resp 18  Ht  (1.6 m)  Wt 63.504 kg  BMI 24.81 kg/m2  SpO2 99%  LMP 04/21/2015 Physical Exam  Constitutional: She is oriented to person, place, and time. She appears well-developed and well-nourished.  HENT:  Head: Normocephalic and atraumatic.  Right Ear: Hearing and ear canal normal. There is drainage. No mastoid tenderness. Tympanic membrane is erythematous.   Left Ear: Hearing, tympanic membrane, external ear and ear canal normal. No mastoid tenderness.  Mouth/Throat: Uvula is midline, oropharynx is clear and moist and mucous membranes are normal. No oropharyngeal exudate, posterior oropharyngeal edema, posterior oropharyngeal erythema or tonsillar abscesses.  Right ear with mild erythema of right ear canal and TM. Mild purulent drainage. External right ear nl and non-tender.  Eyes: Conjunctivae and EOM are normal. Pupils are equal, round, and reactive to light. Right eye exhibits no discharge. Left eye exhibits no discharge. No scleral icterus.  Neck: Normal range of motion. Neck supple.  Cardiovascular: Normal rate, regular rhythm, normal heart sounds and intact distal pulses.  Exam reveals no gallop and no friction rub.   No murmur heard. Pulmonary/Chest: Effort normal and breath sounds normal. No respiratory distress. She has no wheezes. She has no rales. She exhibits no tenderness.  Abdominal: Soft. Bowel sounds are normal. There is no tenderness.  Musculoskeletal: Normal range of motion. She exhibits no edema.  Lymphadenopathy:    She has no cervical adenopathy.  Neurological: She is alert and oriented to person, place, and time. She has normal strength. A sensory deficit is present. Coordination normal.  Decreased sensation reported to right side of face. Unable to raise right eyebrow or wrinkle right forehead. Inability to close right eye completely. Drooping to right mouth corner with smiling. Remaining cranial nerves intact. Nl coordination. Sensation grossly intact of all four limbs. Grip strength equal bilaterally.   Skin: Skin is warm and dry.  Nursing note and vitals reviewed.   ED Course  Procedures (including critical care time) DIAGNOSTIC STUDIES: Oxygen Saturation is 100% on RA, nl by my interpretation.    COORDINATION OF CARE: 8:15 PM Discussed treatment plan with pt at bedside which includes steroid Rx and antiviral Rx and pt  agreed to plan.   MDM   Final diagnoses:  Bell's palsy    Patient presents with right-sided facial numbness and weakness that started 5 days ago. She reports having multiple ear infections over the past month which she has been treated with antibiotics. Denies following up with ENT. VSS. Exam revealed decreased sensation to right side of face, patient unable to raise right eyebrow or wrinkle right forehead, unable to close right eye completely, drooping of right mouth corner noted, remaining cranial nerves intact. Remaining neuro exam unremarkable. Exam consistent with Bell's palsy. Due to exam only revealing right facial nerve palsy, I do not feel that any further workup or imaging is warranted at this time. Plan to discharge patient home with Ear exam revealed right ear canal and TM mildly erythematous with small amount of purulent drainage noted and ear canal, consistent with otitis externa. I advised patient to continue taking her prescription of cortisporin as prescribed and follow up with ENT. Plan to discharge patient home with steroids, antiviral and artificial tears. Advised patient to follow up with  ophthalmology in 3 days. Discussed return precautions with patient.  I personally performed the services described in this documentation, which was scribed in my presence. The recorded information has been reviewed and is accurate.    Satira Sark [place], [place] 05/04/16 2328  Vanetta Mulders, MD 05/04/16 [phone]

## 2016-05-04 NOTE — ED Notes (Signed)
Pain in her right ear for a month. Numbness in the right side of her face x 3 days. No hx of bells palsy.

## 2016-07-12 ENCOUNTER — Emergency Department (HOSPITAL_BASED_OUTPATIENT_CLINIC_OR_DEPARTMENT_OTHER)
Admission: EM | Admit: 2016-07-12 | Discharge: 2016-07-12 | Disposition: A | Discharge status: Home / Self Care | Payer: Medicaid Other | Attending: Emergency Medicine | Admitting: Emergency Medicine

## 2016-07-12 ENCOUNTER — Encounter (HOSPITAL_BASED_OUTPATIENT_CLINIC_OR_DEPARTMENT_OTHER): Payer: Self-pay | Admitting: Emergency Medicine

## 2016-07-12 DIAGNOSIS — H6091 Unspecified otitis externa, right ear: Secondary | ICD-10-CM | POA: Diagnosis not present

## 2016-07-12 DIAGNOSIS — J3489 Other specified disorders of nose and nasal sinuses: Secondary | ICD-10-CM | POA: Diagnosis not present

## 2016-07-12 DIAGNOSIS — R42 Dizziness and giddiness: Secondary | ICD-10-CM | POA: Diagnosis not present

## 2016-07-12 DIAGNOSIS — Z87891 Personal history of nicotine dependence: Secondary | ICD-10-CM | POA: Diagnosis not present

## 2016-07-12 DIAGNOSIS — H938X1 Other specified disorders of right ear: Secondary | ICD-10-CM | POA: Diagnosis present

## 2016-07-12 MED ORDER — MECLIZINE HCL 12.5 MG PO TABS: 12.5000 mg | ORAL_TABLET | Freq: Three times a day (TID) | ORAL | 0 refills | Status: DC | PRN

## 2016-07-12 MED ORDER — FLUTICASONE PROPIONATE 50 MCG/ACT NA SUSP: 2.0000 | Freq: Every day | NASAL | 0 refills | Status: DC

## 2016-07-12 MED ORDER — CIPROFLOXACIN-DEXAMETHASONE 0.3-0.1 % OT SUSP
4.0000 [drp] | Freq: Two times a day (BID) | OTIC | Status: AC
  Administered 2016-07-12: 4 [drp] via OTIC
  Filled 2016-07-12: qty 7.5

## 2016-07-12 NOTE — Discharge Instructions (Signed)
Please place 4 drops of Ciprodex in your right ear twice a day for 7 days.

## 2016-07-12 NOTE — ED Notes (Signed)
Pt directed to pharmacy to pick up prescriptions-  

## 2016-07-12 NOTE — ED Provider Notes (Signed)
[facility] Provider Note   CSN: [id number] Arrival date & time: 07/12/16  1549  By signing my name below, I, Kimberly Mosley, attest that this documentation has been prepared under the direction and in the presence of non-physician practitioner, Everlene Farrier, PA-C. Electronically Signed: Freida Mosley, Scribe. 07/12/2016. 5:03 PM.   History   Chief Complaint Chief Complaint  Patient presents with  . Ear Fullness   The history is provided by the patient. No language interpreter was used.     HPI Comments:  Kimberly Mosley is a 20 y.o. female who presents to the Emergency Department complaining of moderate right ear pressure that she has been experiencing intermittently x ~ 3 months. Pt states the first episode occurred in June 2017 and at that time she noticed bleeding. She had a second episode a few weeks later this one was accompanied by drainage of clear fluid from the ear. She notes clear ear drainage with today's episode as well. She also notes she has been experiencing decreased hearing in the right since the first episode 3 months ago. Pt reports associated nausea and lightheadedness when she stands up too quickly today.  She denies  recent head injury, swimming, air travel and  h/o vertigo. Pt denies fever, neck pain, double vision, numbness, tingling, weakness, chest pain, headache, SOB, HA, sneezing, and postnasal drip.   Past Medical History:  Diagnosis Date  . Medical history non-contributory     Patient Active Problem List   Diagnosis Date Noted  . Vaginal bleeding in pregnancy 09/13/2015    Past Surgical History:  Procedure Laterality Date  . NO PAST SURGERIES      OB History    Gravida Para Term Preterm AB Living   1             SAB TAB Ectopic Multiple Live Births                   Home Medications    Prior to Admission medications   Medication Sig Start Date End Date Taking? Authorizing Provider  fluticasone (FLONASE) 50 MCG/ACT nasal  spray Place 2 sprays into both nostrils daily. 07/12/16   Everlene Farrier, PA-C  hydroxypropyl methylcellulose / hypromellose (ISOPTO TEARS / GONIOVISC) 2.5 % ophthalmic solution Place 1 drop into the right eye every hour while awake. 05/04/16   Barrett Henle, PA-C  meclizine (ANTIVERT) 12.5 MG tablet Take 1 tablet (12.5 mg total) by mouth 3 (three) times daily as needed for dizziness. 07/12/16   Everlene Farrier, PA-C  neomycin-polymyxin-hydrocortisone (CORTISPORIN) otic solution Place 3 drops into the right ear 3 (three) times daily. 04/19/16   Ignacia Bayley, PA-C  predniSONE (DELTASONE) 20 MG tablet Take 3 tablets (60 mg total) by mouth daily. 05/04/16   Barrett Henle, PA-C  valACYclovir (VALTREX) 1000 MG tablet Take 1 tablet (1,000 mg total) by mouth 3 (three) times daily. 05/04/16   Barrett Henle, PA-C    Family History History reviewed. No pertinent family history.  Social History Social History  Substance Use Topics  . Smoking status: Former Smoker    Packs/day: 5.00    Types: Cigarettes  . Smokeless tobacco: Former Neurosurgeon  . Alcohol use No     Allergies   Review of patient's allergies indicates no known allergies.   Review of Systems Review of Systems  Constitutional: Negative for chills and fever.  HENT: Positive for ear discharge, hearing loss (decreased hearing) and rhinorrhea. Negative for congestion, dental problem, drooling,  ear pain, facial swelling, mouth sores, postnasal drip, sinus pressure, sneezing, sore throat, tinnitus, trouble swallowing and voice change.   Eyes: Negative for visual disturbance.  Respiratory: Negative for shortness of breath.   Cardiovascular: Negative for chest pain.  Gastrointestinal: Positive for nausea. Negative for abdominal pain, diarrhea and vomiting.  Musculoskeletal: Negative for neck pain.  Skin: Negative for rash.  Neurological: Positive for light-headedness. Negative for weakness, numbness and headaches.     Physical Exam Updated Vital Signs BP 123/59 (BP Location: Left Arm)   Pulse 97   Temp 98.2 F (36.8 C) (Oral)   Resp 20   Ht 5\' 3"  (1.6 m)   Wt 63.5 kg   LMP 02/28/2015   SpO2 100%   BMI 24.80 kg/m   Physical Exam  Constitutional: She is oriented to person, place, and time. She appears well-developed and well-nourished. No distress.  Nontoxic appearing.  HENT:  Head: Normocephalic and atraumatic.  Right Ear: External ear normal.  Left Ear: External ear normal. A middle ear effusion (mild) is present.  Mouth/Throat: Oropharynx is clear and moist. No oropharyngeal exudate or posterior oropharyngeal edema. No tonsillar exudate.  No tonsillar hypertrophy or exudates.  White discharge noted in right EAC. Nno external auditory canal edema. Right TM is only partially visualized and appears intact and pearly gray. Left TM has mild middle ear effusion.  No mastoid tenderness bilaterally.  No pain with manipulation of pinna   Eyes: Conjunctivae and EOM are normal. Pupils are equal, round, and reactive to light. Right eye exhibits no discharge. Left eye exhibits no discharge.  Neck: Normal range of motion. Neck supple. No JVD present. No tracheal deviation present.  Cardiovascular: Normal rate, regular rhythm, normal heart sounds and intact distal pulses.   Pulmonary/Chest: Effort normal and breath sounds normal. No stridor. No respiratory distress.  Abdominal: Soft. There is no tenderness.  Lymphadenopathy:    She has no cervical adenopathy.  Neurological: She is alert and oriented to person, place, and time. No cranial nerve deficit. Coordination and gait normal.  Patient is alert and oriented 3. Cranial nerves are intact. Speech is clear and coherent. EOMs are intact. Vision is grossly intact. Hearing is intact but decreased in her right ear. Normal gait.  Skin: Skin is warm and dry. Capillary refill takes less than 2 seconds. No rash noted. She is not diaphoretic. No erythema. No  pallor.  Psychiatric: She has a normal mood and affect. Her behavior is normal.  Nursing note and vitals reviewed.   ED Treatments / Results  DIAGNOSTIC STUDIES:  Oxygen Saturation is 100% on RA, normal by my interpretation.    COORDINATION OF CARE:  4:58 PM Discussed treatment plan with pt at bedside and pt agreed to plan.  Labs (all labs ordered are listed, but only abnormal results are displayed) Labs Reviewed - No data to display  EKG  EKG Interpretation None       Radiology No results found.  Procedures Procedures (including critical care time)  Medications Ordered in ED Medications  ciprofloxacin-dexamethasone (CIPRODEX) 0.3-0.1 % otic suspension 4 drop (4 drops Right Ear Given 07/12/16 1725)     Initial Impression / Assessment and Plan / ED Course  I have reviewed the triage vital signs and the nursing notes.  Pertinent labs & imaging results that were available during my care of the patient were reviewed by me and considered in my medical decision making (see chart for details).  Clinical Course   Patient presented to the  emergency department complaining of right ear pressure, discharge and decreased hearing for approximately 3 months. She reports it is been intermittent but has been ongoing at least for the past month. No recent head injury or trauma. She reports no bloody discharge. Clearish discharge. She's not been swimming in the pool or recent air travel. She reports decreased hearing to her right ear. No fevers. No headaches. On exam the patient is afebrile nontoxic appearing. Her left ear has a mild middle ear effusion and hearing is intact. Right TM has some clearish and white discharge to her external auditory canal. No mastoid tenderness. No tenderness with manipulation of her pinna. TM is only partially visualized on the right and appears intact and pearly gray. No focal neurological deficits. Concern for otitis externa as well as possible TM  perforation. I has been ongoing for many months. Will start on Ciprodex eardrops and have her follow-up with ENT.  Will also start on fluticasone nasal spray meclizine to use as needed for any dizziness. I discussed return precautions. I advised the patient to follow-up with their primary care provider this week. I advised the patient to return to the emergency department with new or worsening symptoms or new concerns. The patient verbalized understanding and agreement with plan.      Final Clinical Impressions(s) / ED Diagnoses   Final diagnoses:  Otitis externa, right    New Prescriptions New Prescriptions   FLUTICASONE (FLONASE) 50 MCG/ACT NASAL SPRAY    Place 2 sprays into both nostrils daily.   MECLIZINE (ANTIVERT) 12.5 MG TABLET    Take 1 tablet (12.5 mg total) by mouth 3 (three) times daily as needed for dizziness.  I personally performed the services described in this documentation, which was scribed in my presence. The recorded information has been reviewed and is accurate.       Everlene FarrierWilliam Diondre Pulis, PA-C 07/12/16 1728    Benjiman CoreNathan Pickering, MD 07/12/16 [id number]

## 2016-07-12 NOTE — ED Notes (Signed)
PA at bedside.

## 2016-07-12 NOTE — ED Triage Notes (Signed)
Patient states that she has had trouble with her right ear intermittently for month. Reports today pain and nausea with her ear

## 2016-08-31 IMAGING — US US OB TRANSVAGINAL
1 series · 14 of 28 positions shown · non-contrast
Comparison: None.

CLINICAL DATA: Abdominal and pelvic pain for 1 week.

EXAM:
OBSTETRIC <14 WK US AND TRANSVAGINAL OB US
TECHNIQUE: Both transabdominal and transvaginal ultrasound examinations were
performed for complete evaluation of the gestation as well as the
maternal uterus, adnexal regions, and pelvic cul-de-sac.
Transvaginal technique was performed to assess early pregnancy.

[Series 1: us ob transvaginal · 0.14mm/px · 14 of 94 slices shown]
[im 4/94]
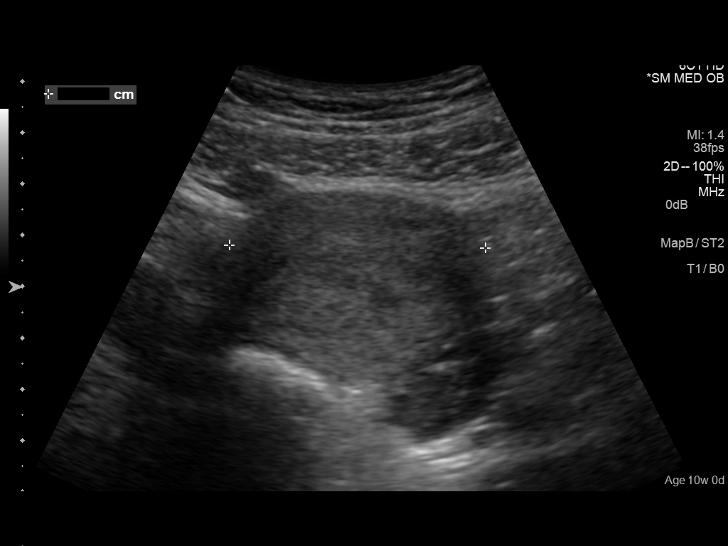
[im 11/94]
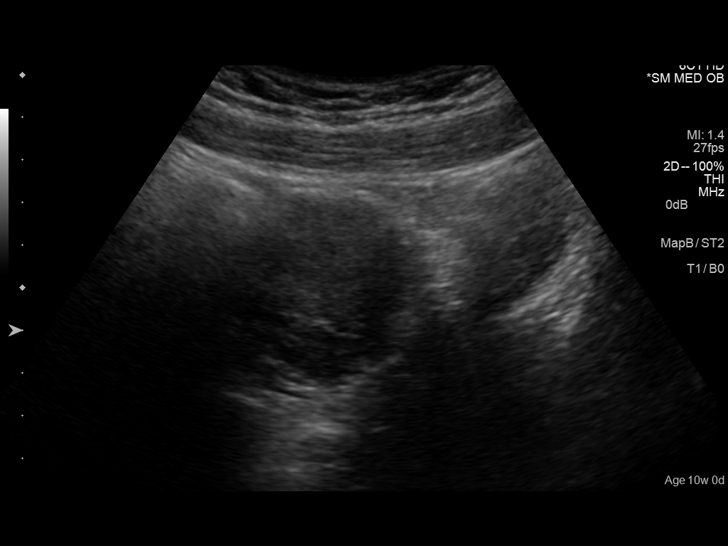
[im 18/94]
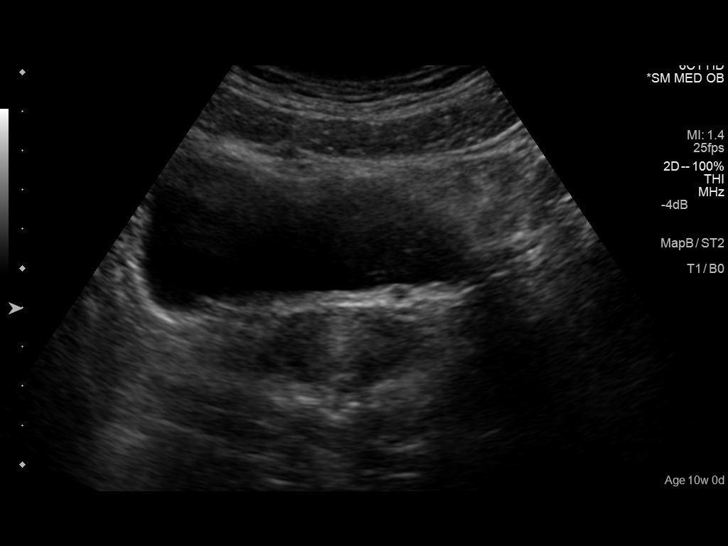
[im 25/94]
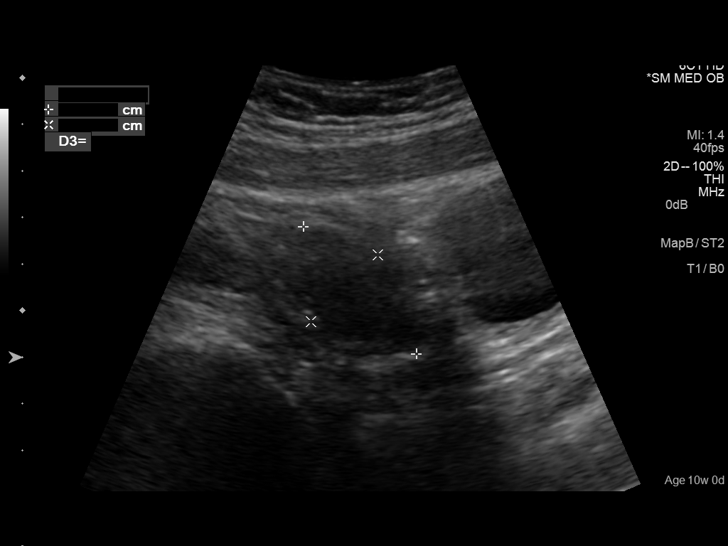
[im 32/94]
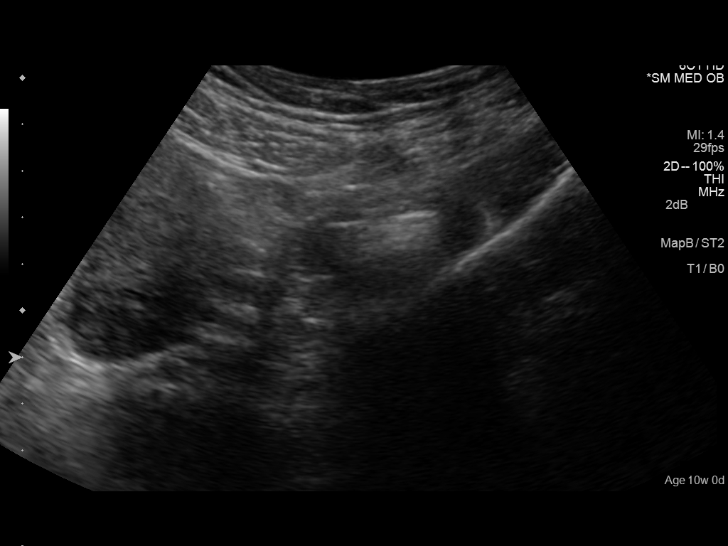
[im 38/94]
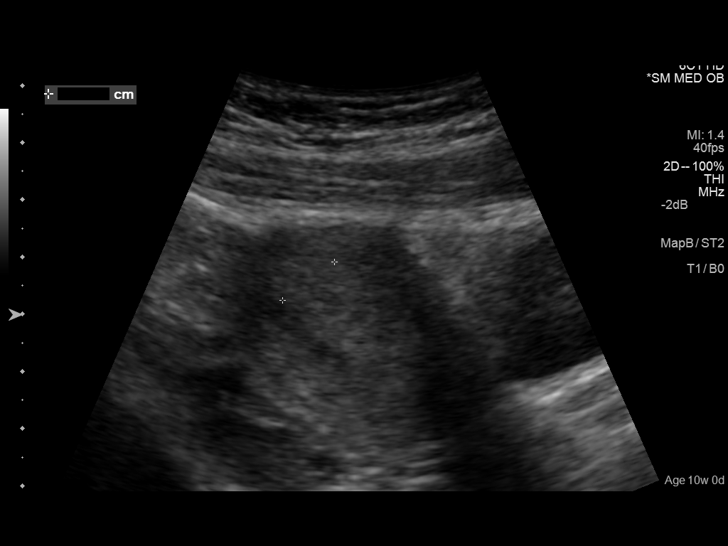
[im 45/94]
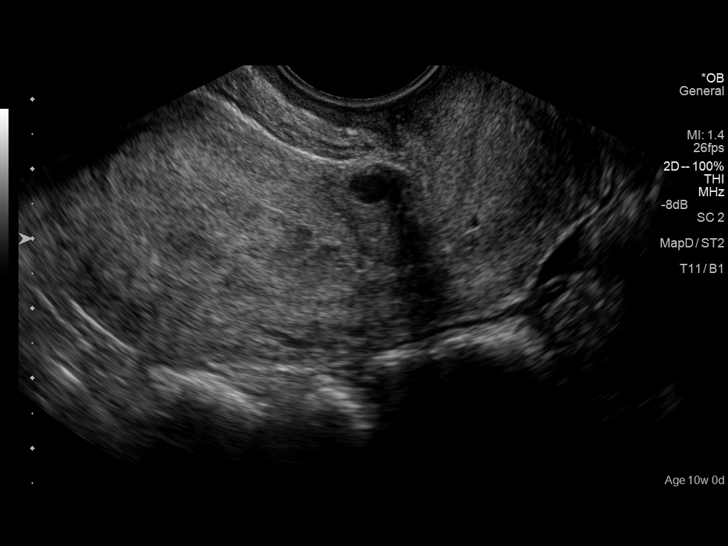
[im 52/94]
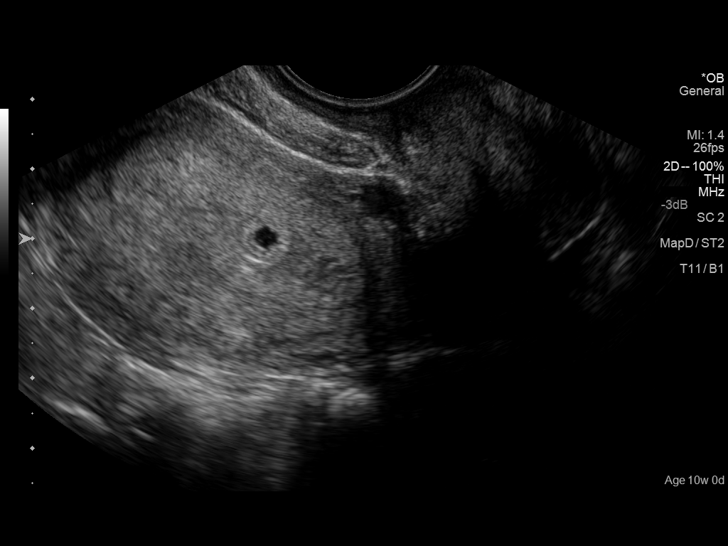
[im 59/94]
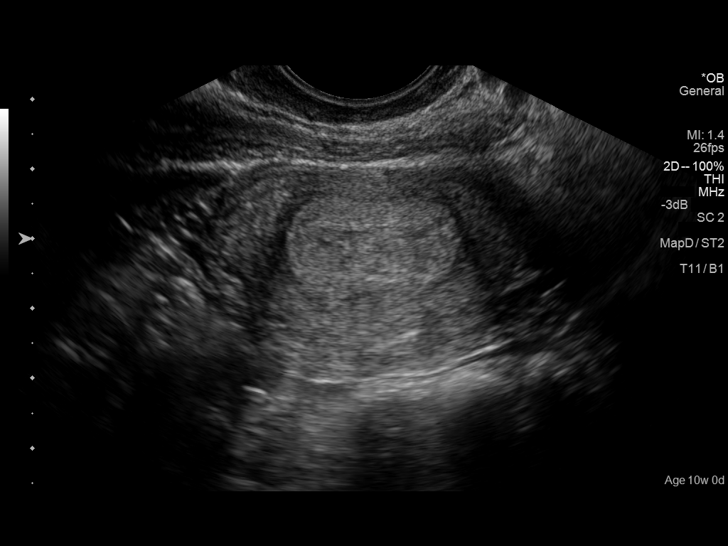
[im 66/94]
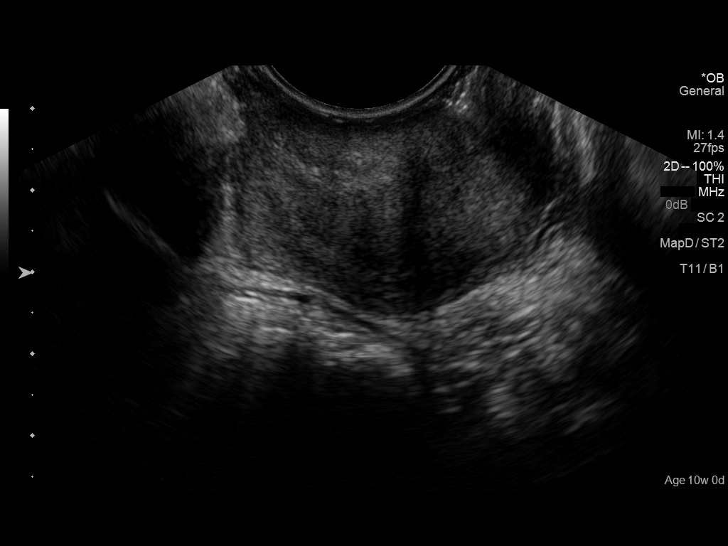
[im 73/94]
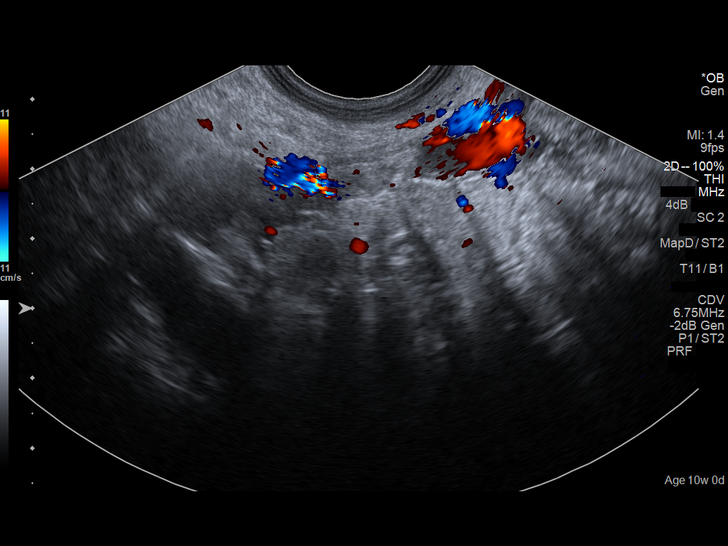
[im 80/94]
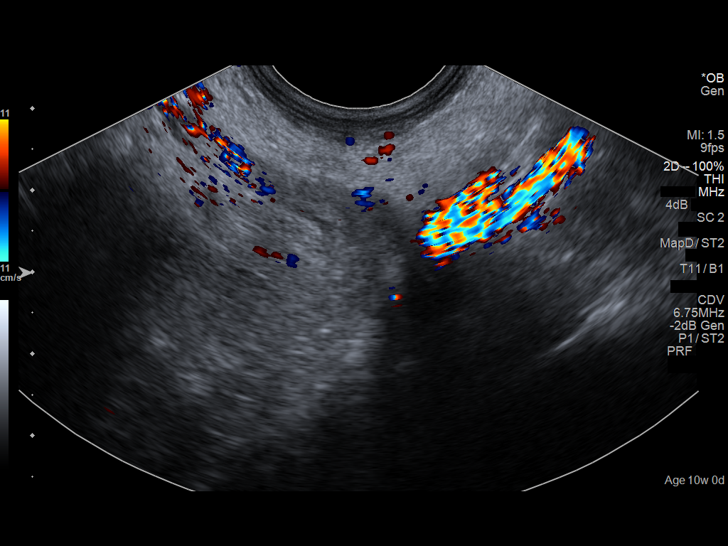
[im 87/94]
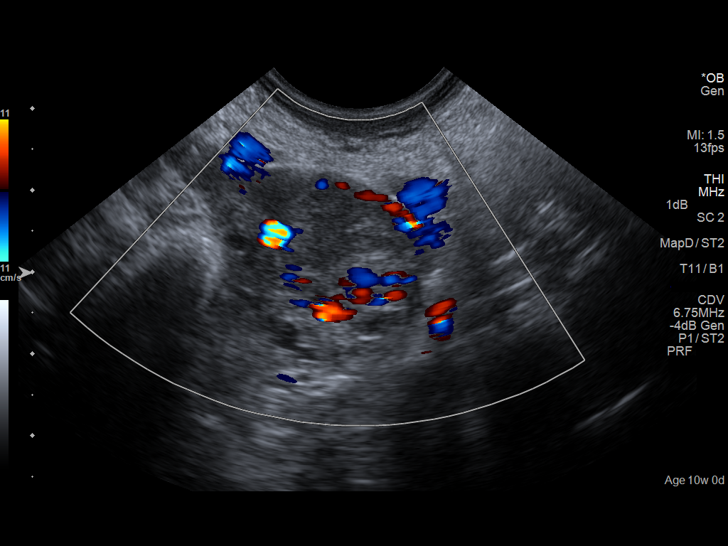
[im 94/94]
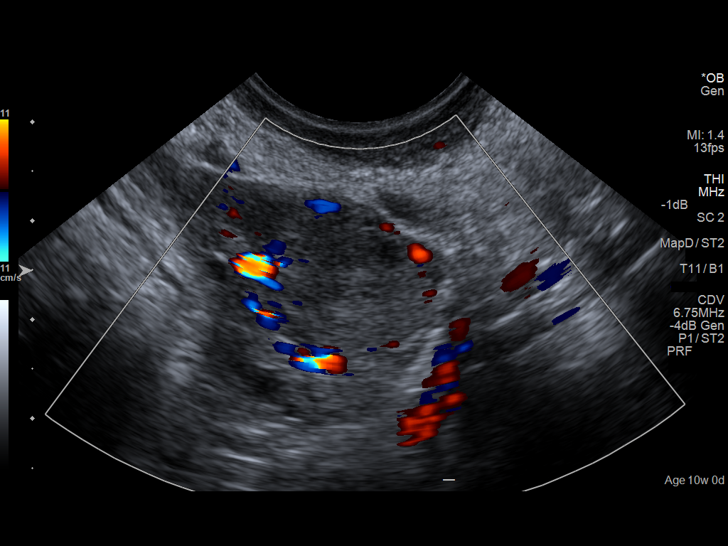

[14 of 28 positions shown; findings below may reference images not displayed]

FINDINGS: Intrauterine gestational sac: Visualized/normal in shape.  Single.

Yolk sac:  Not present

Embryo:  Not present

Cardiac Activity: Not present

Heart Rate: Not present  bpm

MSD: 4.2  mm   5 w   1  d

CRL:    mm    w    d                  US EDC:

Maternal uterus/adnexae: The right ovary is normal. There is a 1.7 x
1.8 x 1.5 cm hypoechoic area in the left ovary question corpus
luteal cyst. Trace free fluid is identified in the pelvis.
IMPRESSION: Single intrauterine gestational identified measuring to 5 weeks and
1 day. The yolk sac, embryo are not present. The findings can be due
to early pregnancy. Follow-up in 2 weeks is recommended.

## 2016-11-15 IMAGING — US US OB LIMITED
1 series · 14 of 28 positions shown · non-contrast
Comparison: none

CLINICAL DATA: Second trimester with light vaginal bleeding since
07/23/2015

EXAM:
LIMITED OBSTETRIC ULTRASOUND

[Series 1: us ob limited · 0.23mm/px · 14 of 43 slices shown]
[im 2/43]
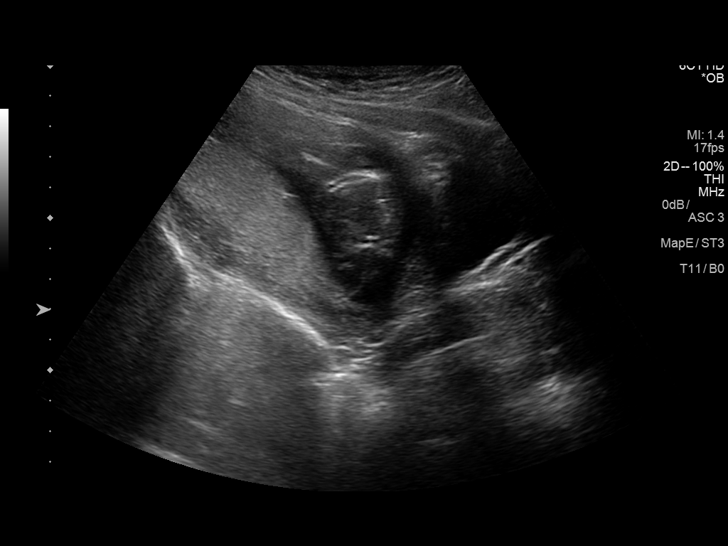
[im 5/43]
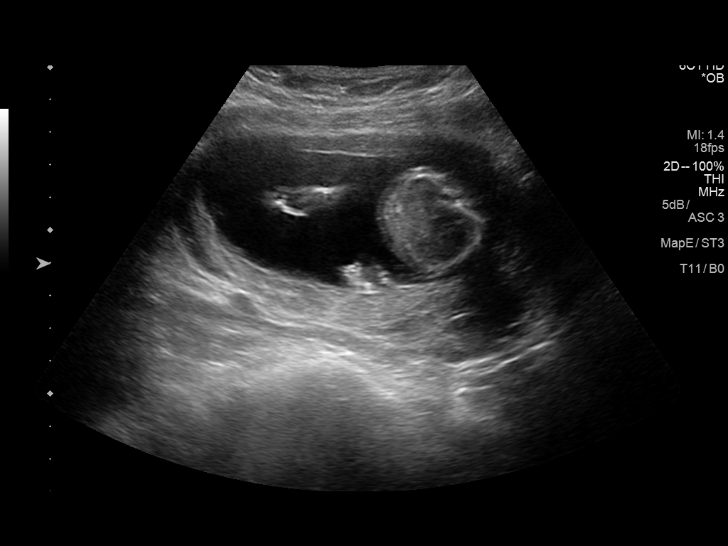
[im 8/43]
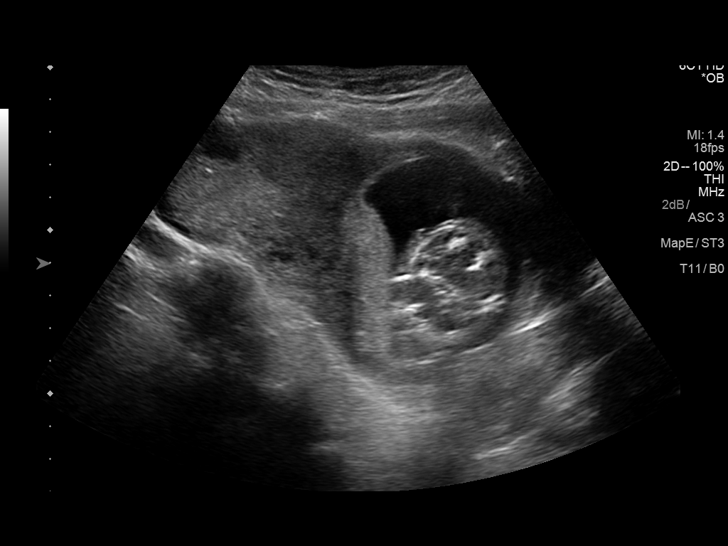
[im 11/43]
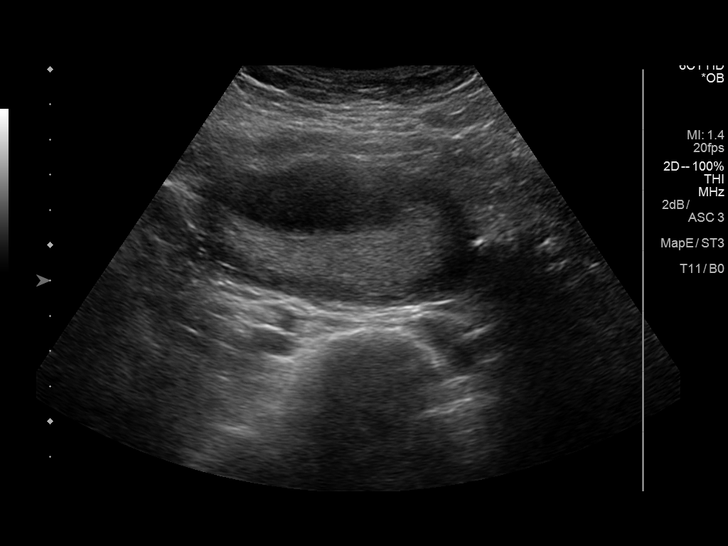
[im 15/43]
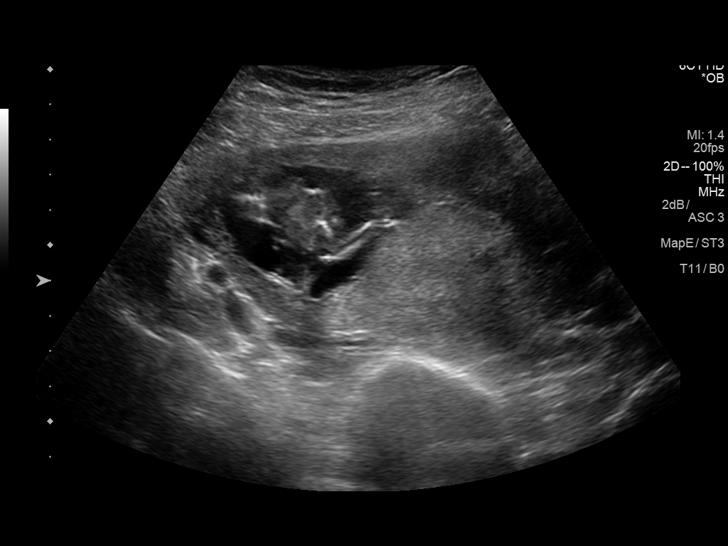
[im 18/43]
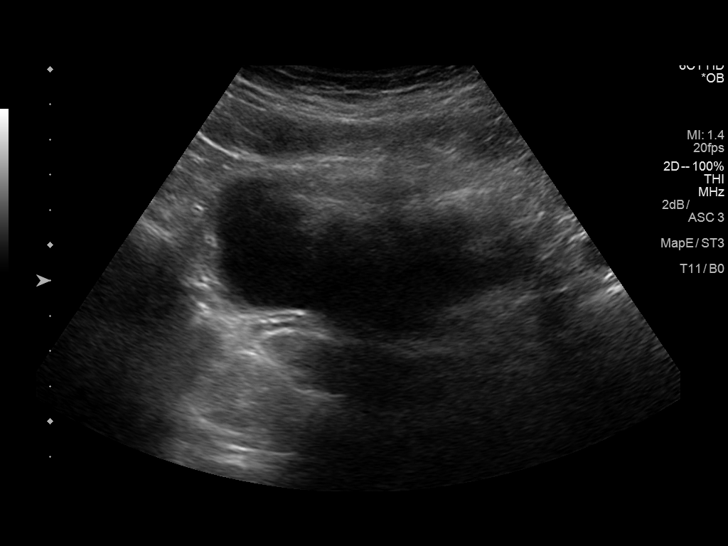
[im 21/43]
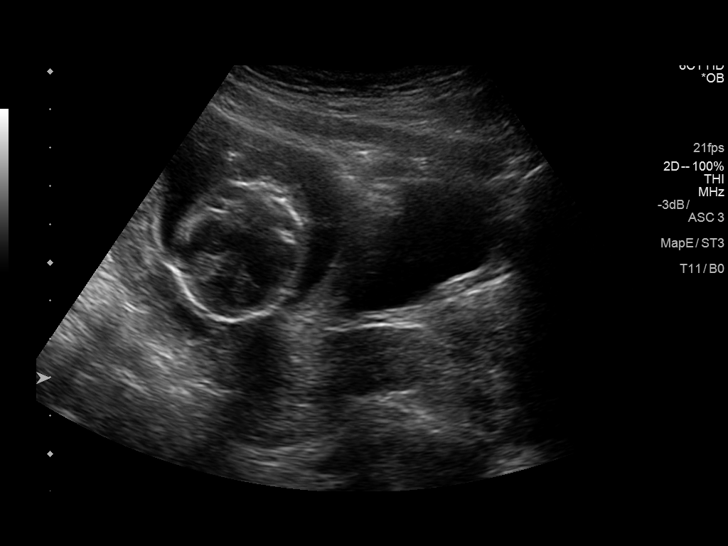
[im 24/43]
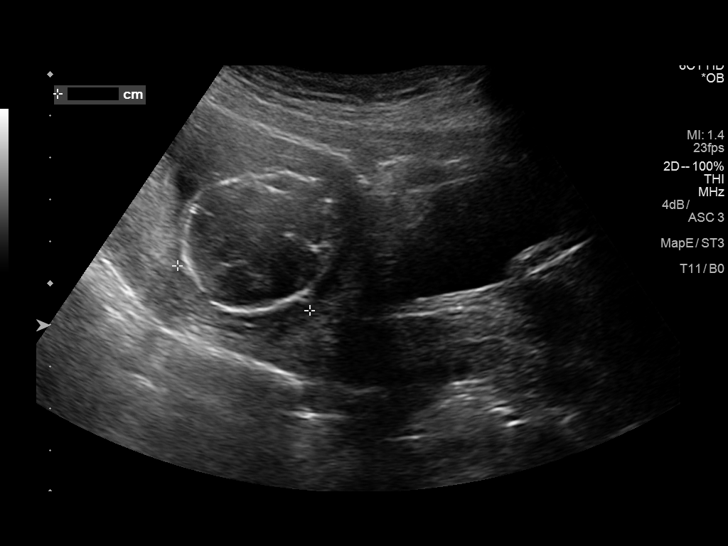
[im 27/43]
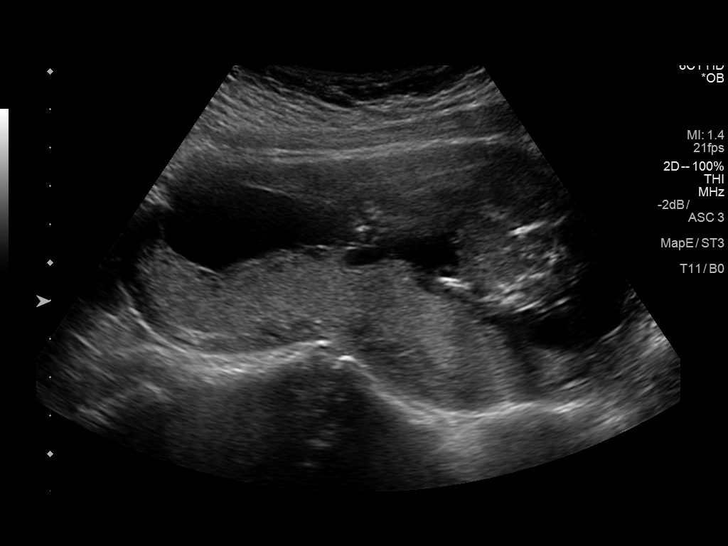
[im 30/43]
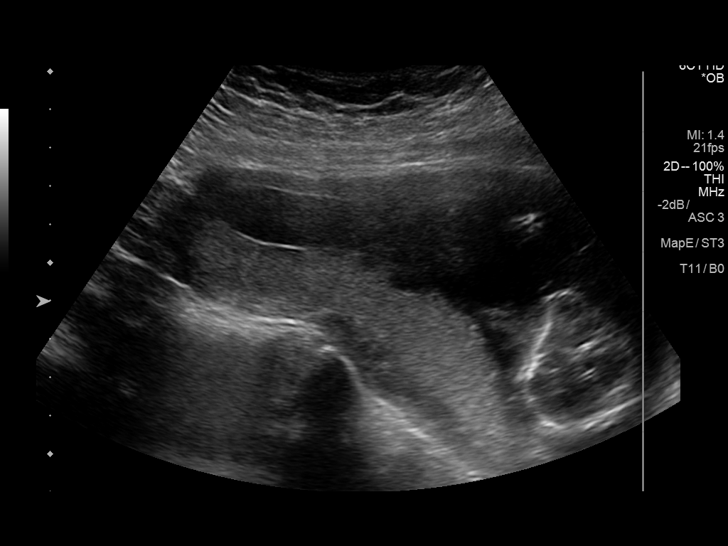
[im 33/43]
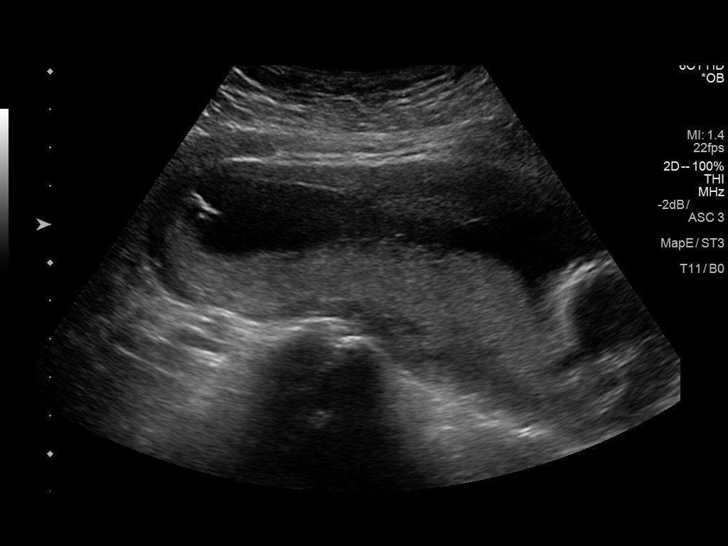
[im 36/43]
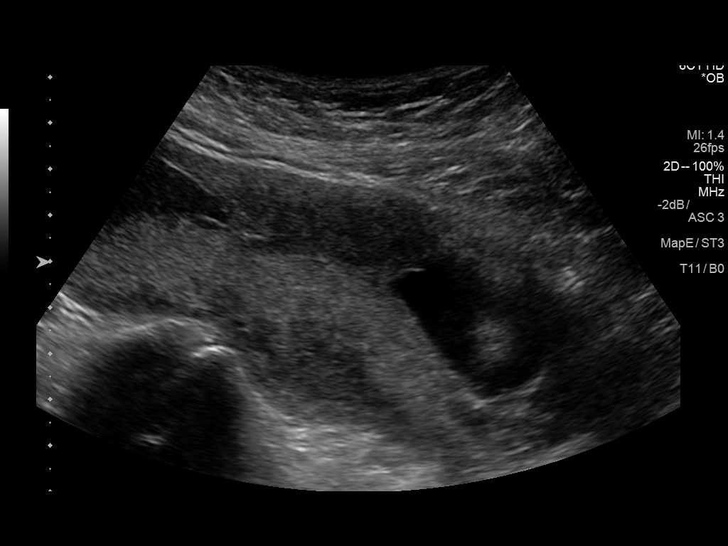
[im 39/43]
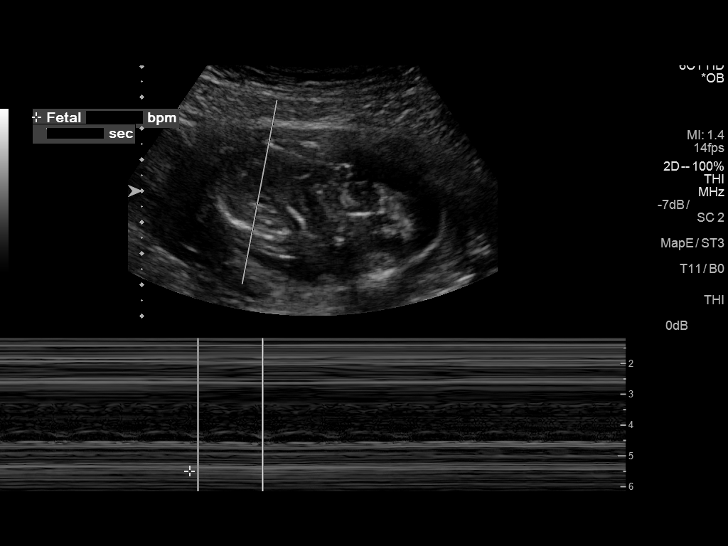
[im 43/43]
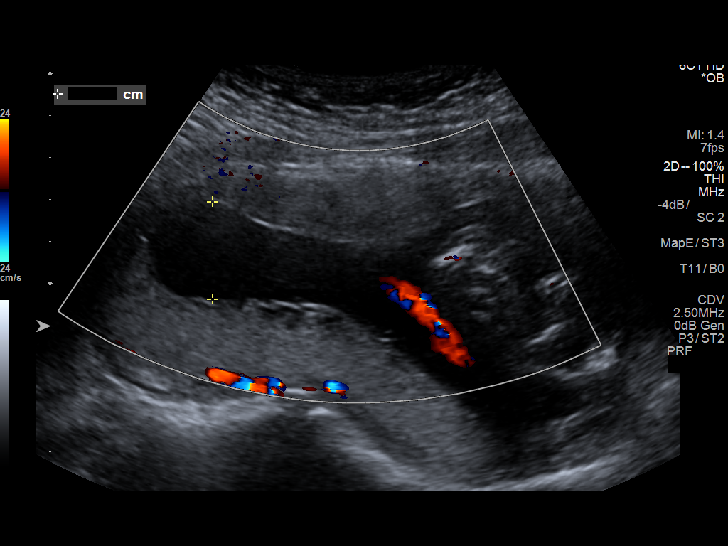

[14 of 28 positions shown; findings below may reference images not displayed]

FINDINGS: Number of Fetuses: 1

Heart Rate:  158 bpm

Movement: Yes

Presentation: Cephalic

Placental Location: Posterior

Previa: No

Amniotic Fluid (Subjective):  Within normal limits.

Femur length:  1.8cm 15Celeste Mitoire

MATERNAL FINDINGS:

Cervix:  Appears closed.  Length is 3.0 cm.

Uterus/Adnexae:  Not visualized
IMPRESSION: Single live intrauterine gestation with no acute findings

This exam is performed on an emergent basis and does not
comprehensively evaluate fetal size, dating, or anatomy; follow-up
complete OB US should be considered if further fetal assessment is
warranted.

## 2016-12-14 IMAGING — US US OB COMP +14 WK
1 series · 14 of 28 positions shown · non-contrast
Comparison: none

CLINICAL DATA: Pregnancy.  Size greater than dates.

EXAM:
OBSTETRIC 14+ WK ULTRASOUND

[Series 1: us ob comp +14 wk · 0.23mm/px · 14 of 112 slices shown]
[im 5/112]
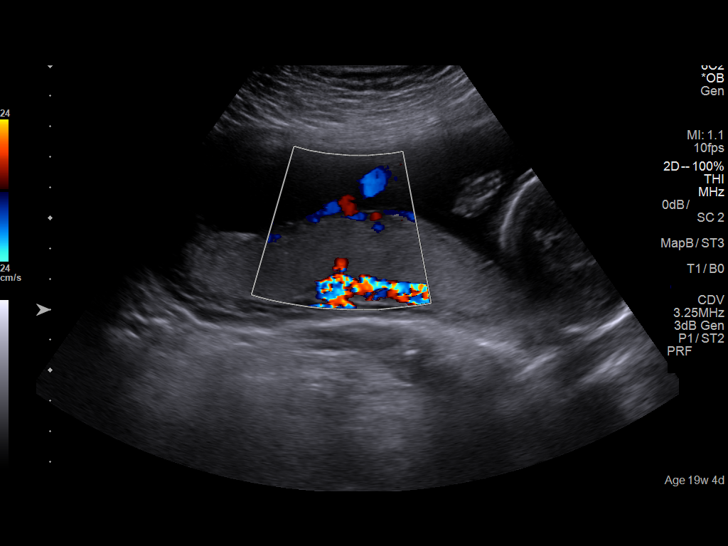
[im 13/112]
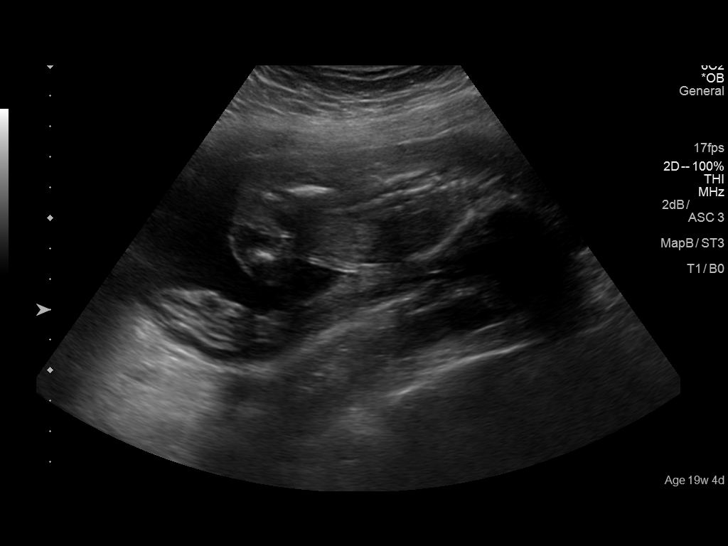
[im 21/112]
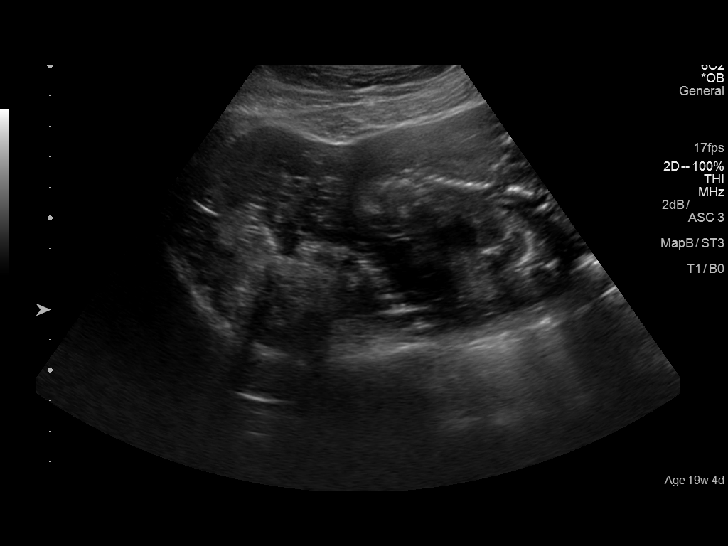
[im 29/112]
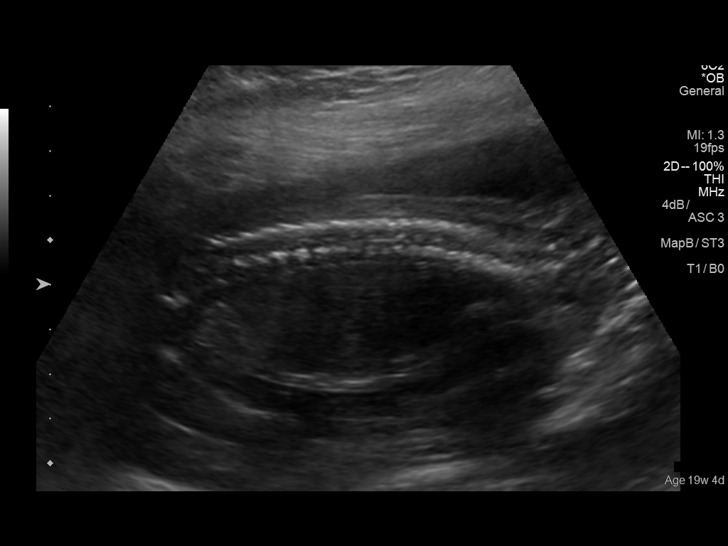
[im 38/112]
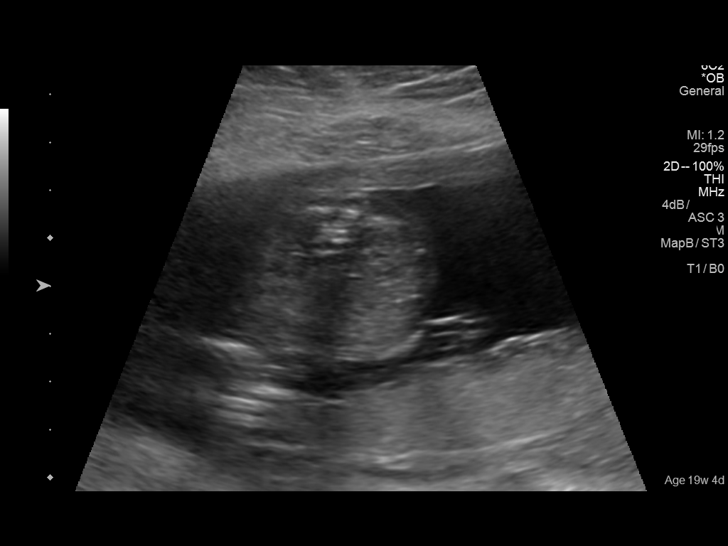
[im 46/112]
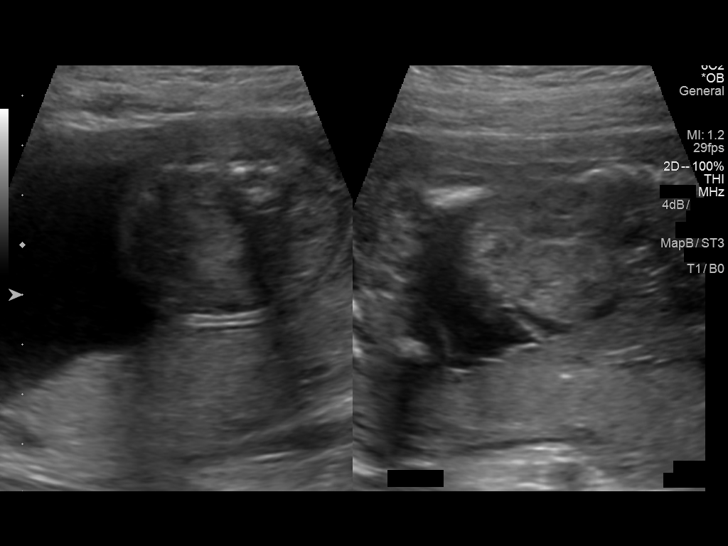
[im 54/112]
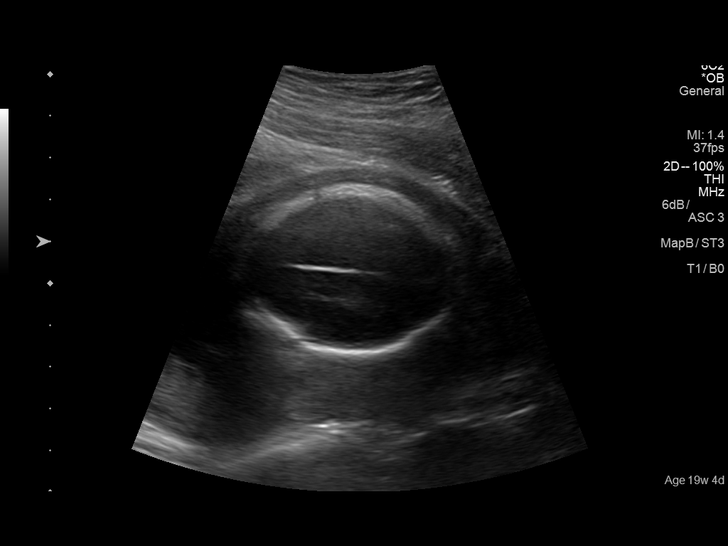
[im 62/112]
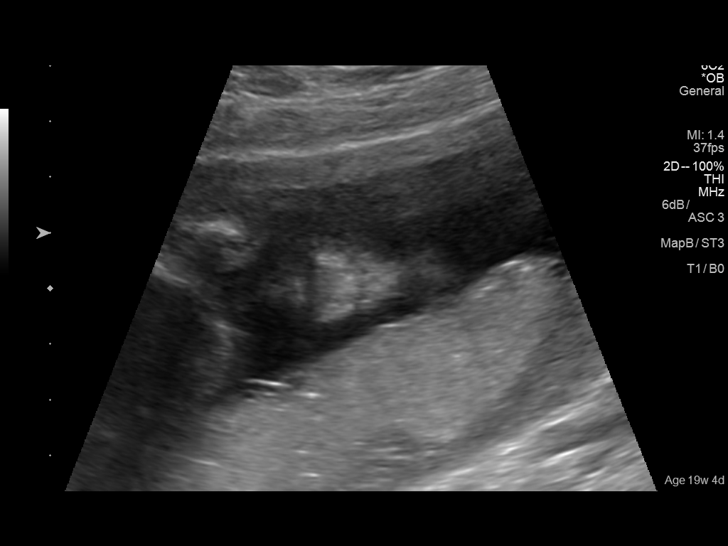
[im 70/112]
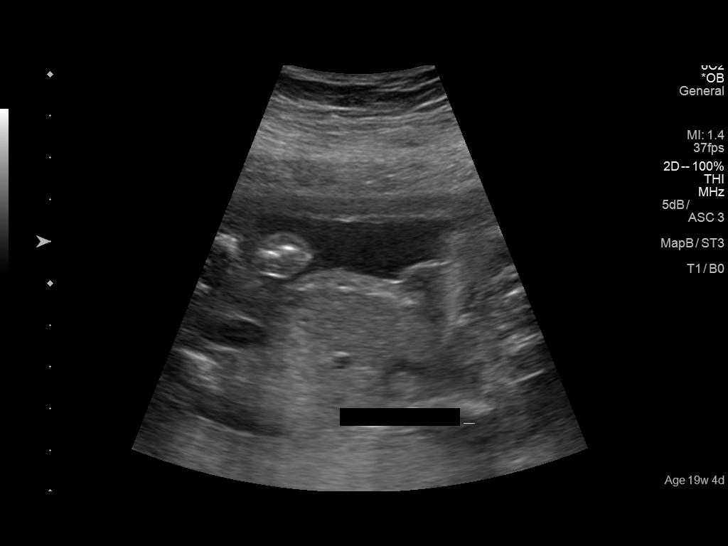
[im 79/112]
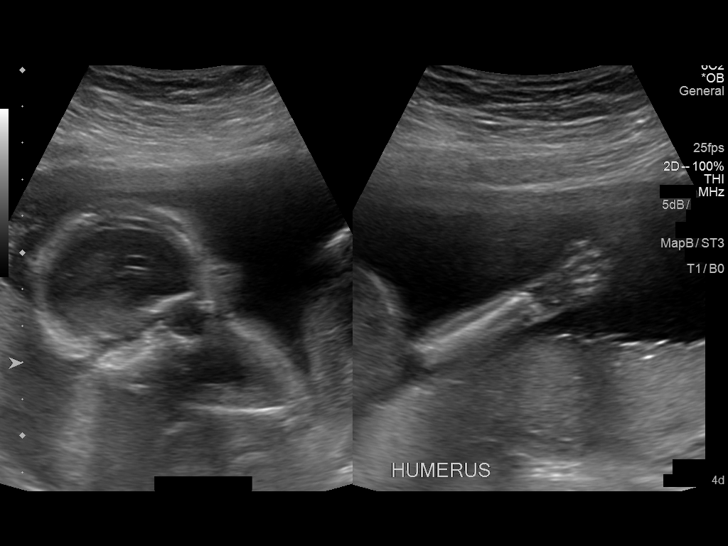
[im 87/112]
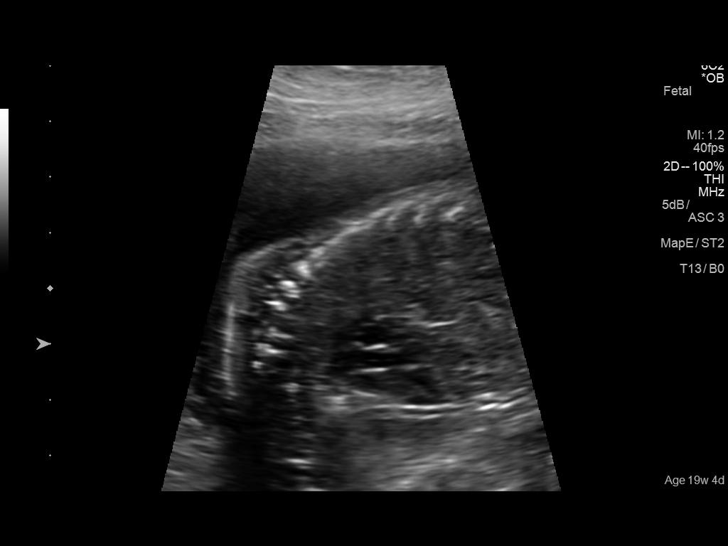
[im 95/112]
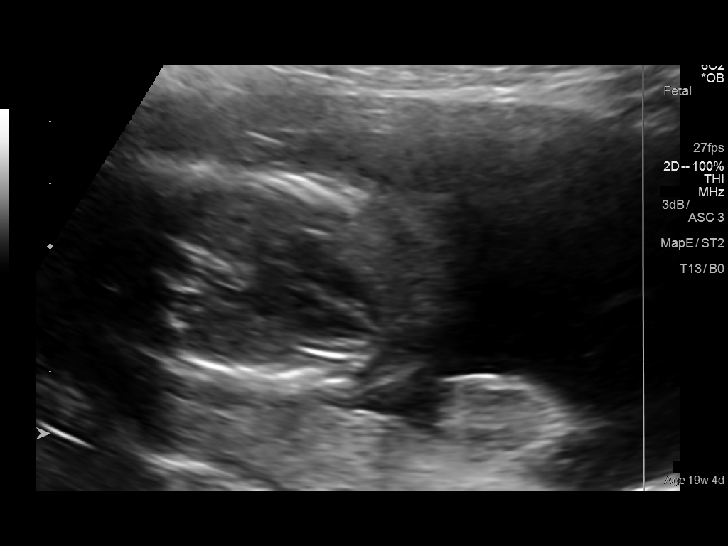
[im 103/112]
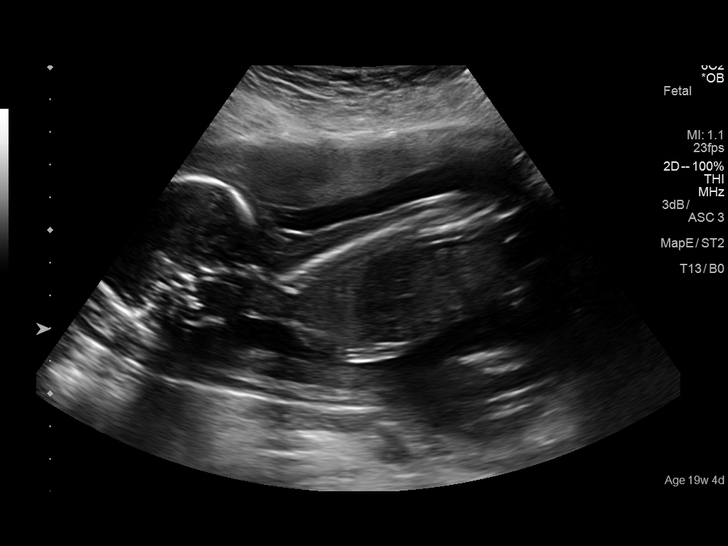
[im 112/112]
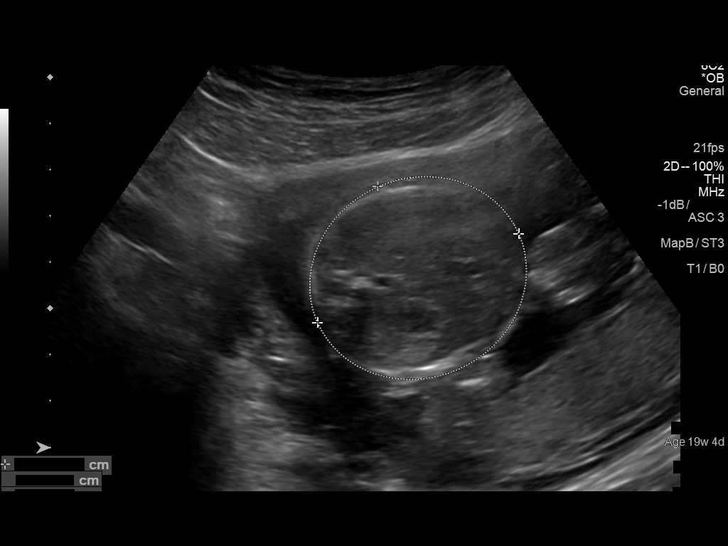

[14 of 28 positions shown; findings below may reference images not displayed]

FINDINGS: Number of Fetuses: 1

Heart Rate:  145 bpm

Movement: Present

Presentation: Variable

Previa: No

Placental Location: Posterior

Amniotic Fluid (Subjective): Normal

Amniotic Fluid (Objective):

Vertical pocket 3.6cm

FETAL BIOMETRY

BPD:  4.4cm 19w 2d

HC:    14.4cm  19w   6d

AC:   14.4cm  19w   5d

FL:   3.2cm  20[place]ekak   Midori

Current Mean GA: 19w 6d              US EDC: 01/10/2016

FETAL ANATOMY

Lateral Ventricles: Visualized

Thalami/CSP: Visualized

Posterior Fossa:  Visualized

Nuchal Region: Visualized    NFT= 3.5mm

Upper Lip: Visualized

Spine: Visualized

4 Chamber Heart on Left: Visualized

LVOT: Visualized

RVOT: Visualized

Stomach on Left: Visualized

3 Vessel Cord: Visualized

Cord Insertion site: Visualized

Kidneys: Visualized

Bladder: New visualized

Extremities: Visualized

Maternal Findings:

Cervix:  3.1 cm and closed
IMPRESSION: Single viable intrauterine pregnancy at 19 weeks 6 days.

## 2017-01-05 IMAGING — US US OB LIMITED
1 series · 14 of 15 positions shown · non-contrast
Comparison: none

CLINICAL DATA: Vaginal bleeding in pregnancy, second trimester
LOC.2P (C29-[id number]-CM)

EXAM:
LIMITED OBSTETRIC ULTRASOUND

[Series 1: us ob limited · 0.22mm/px · 14 of 15 slices shown]
[im 1/15]
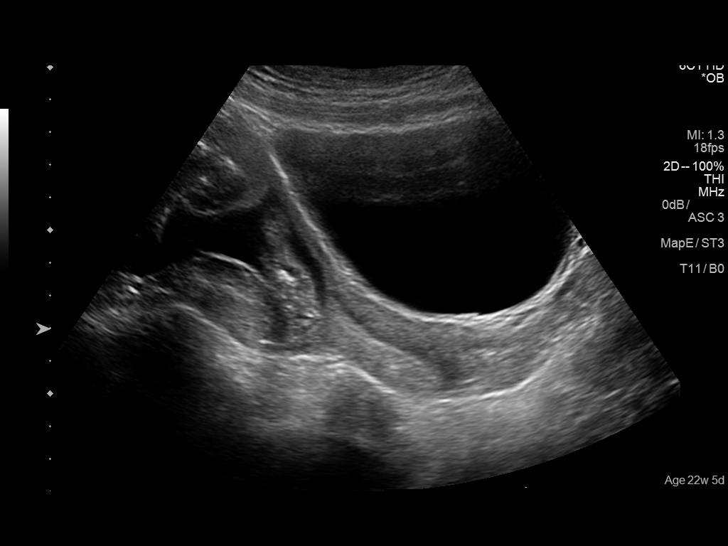
[im 2/15]
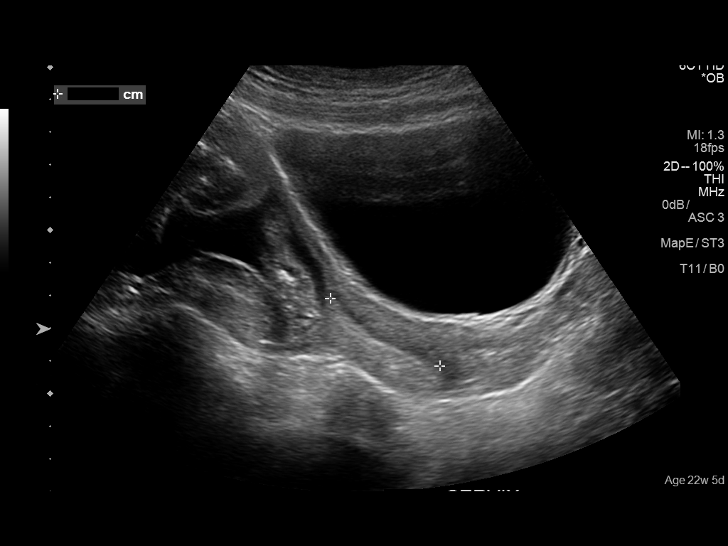
[im 3/15]
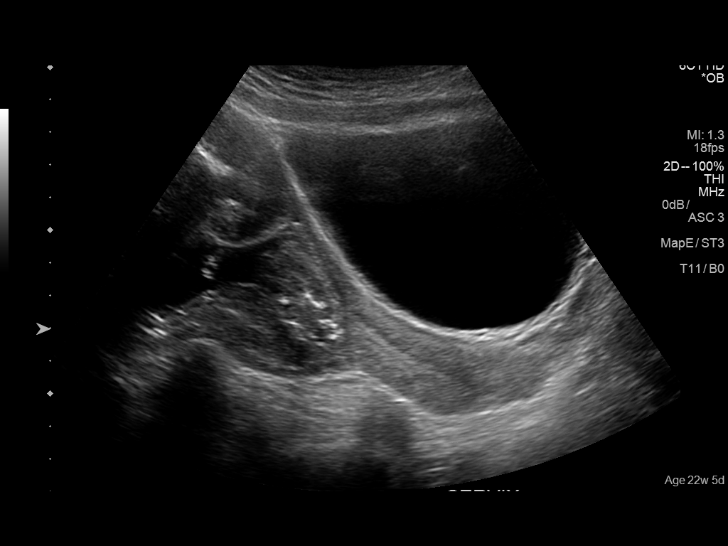
[im 4/15]
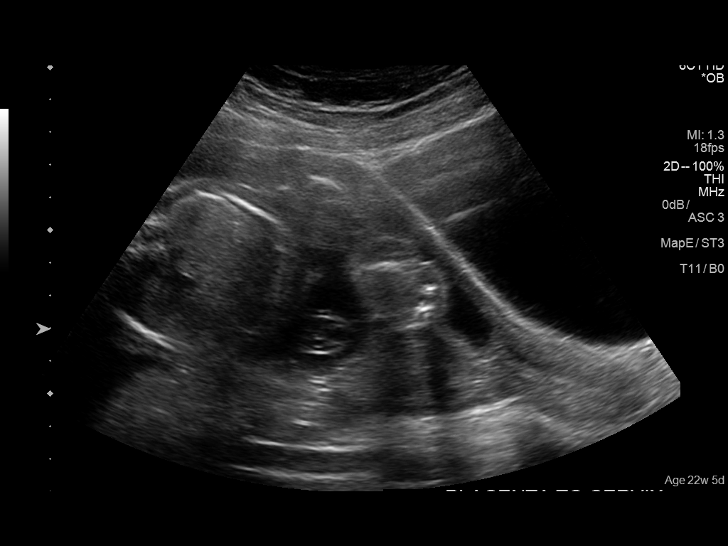
[im 5/15]
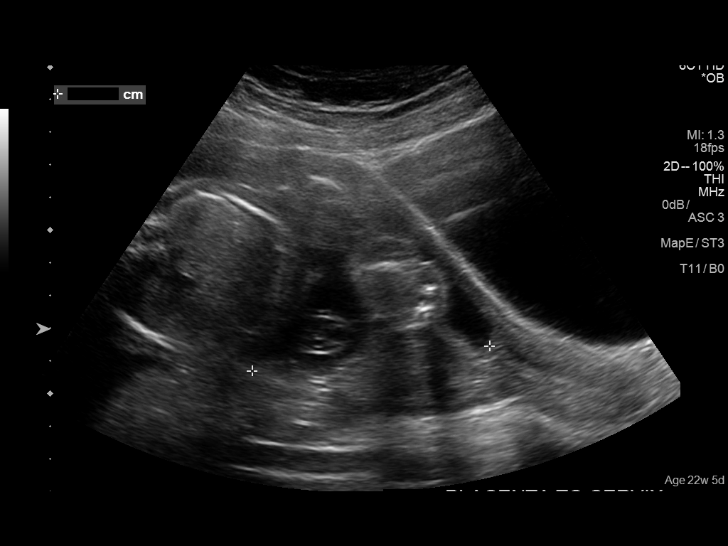
[im 6/15]
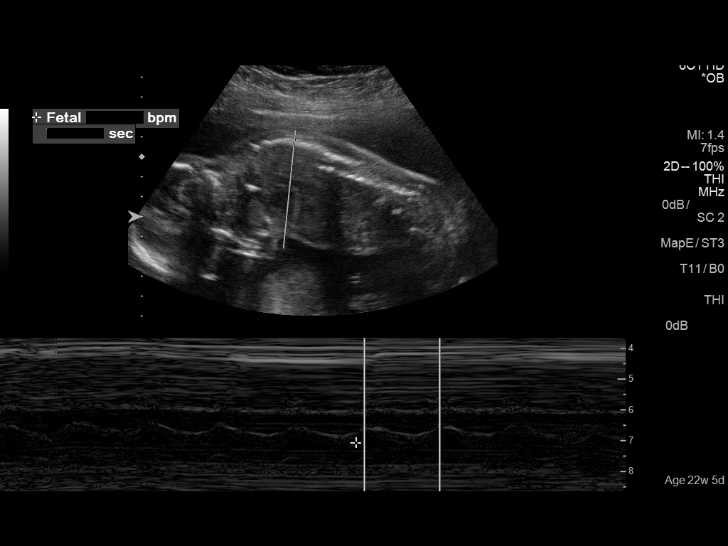
[im 7/15]
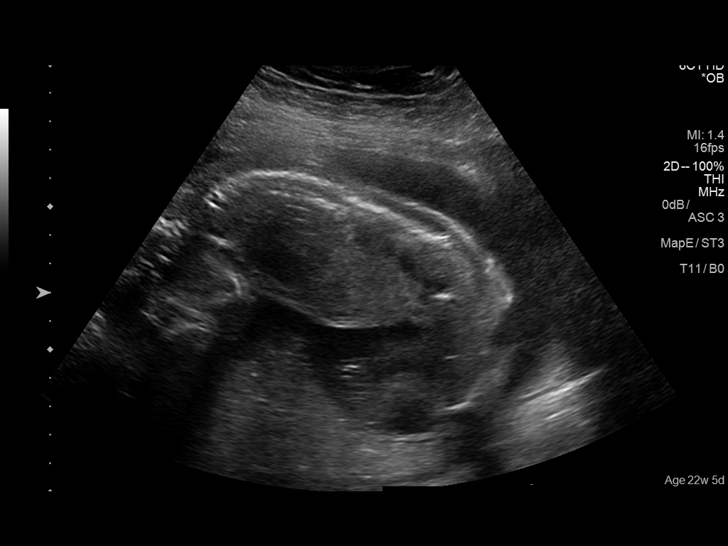
[im 9/15]
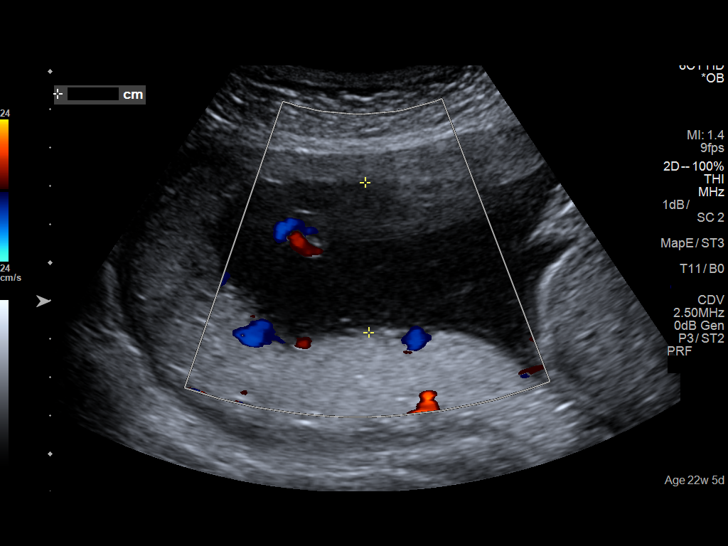
[im 10/15]
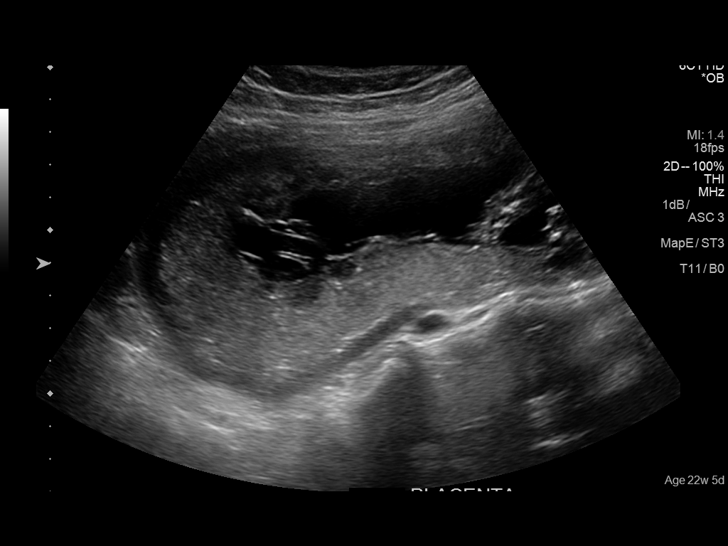
[im 11/15]
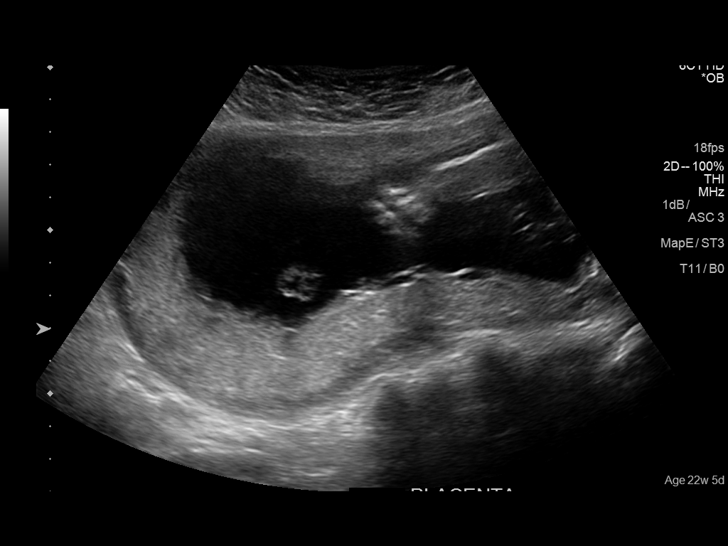
[im 12/15]
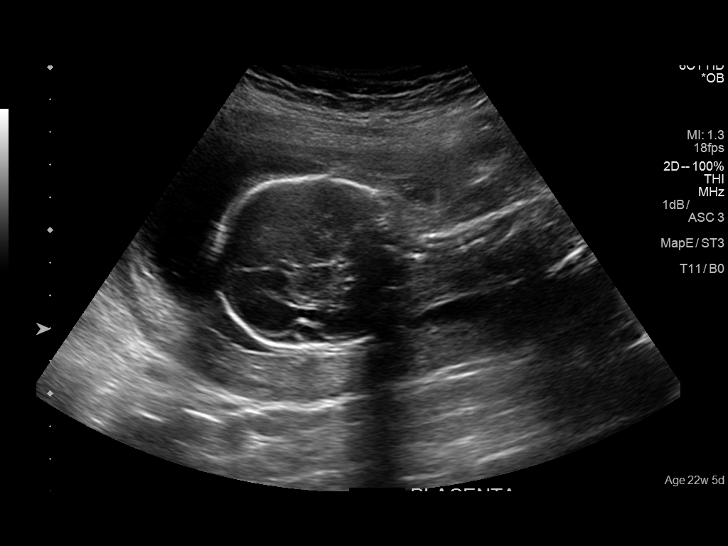
[im 13/15]
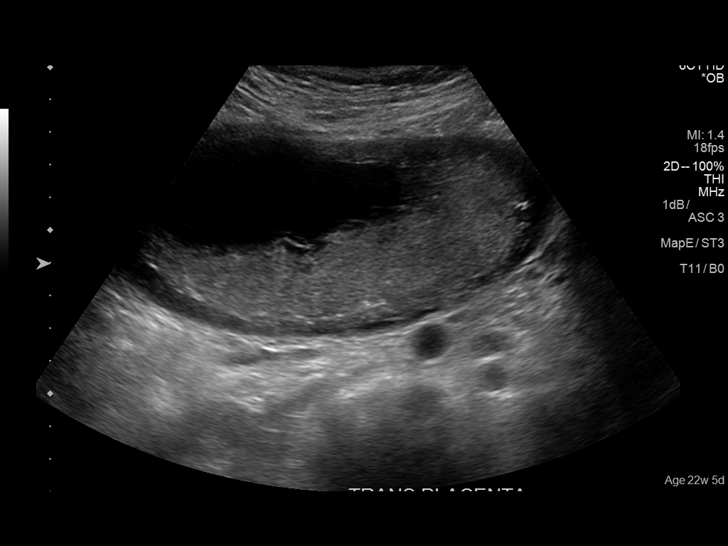
[im 14/15]
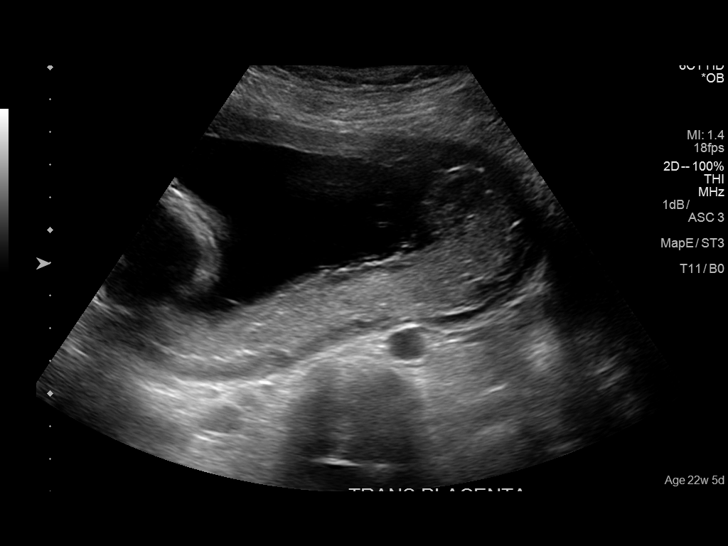
[im 15/15]
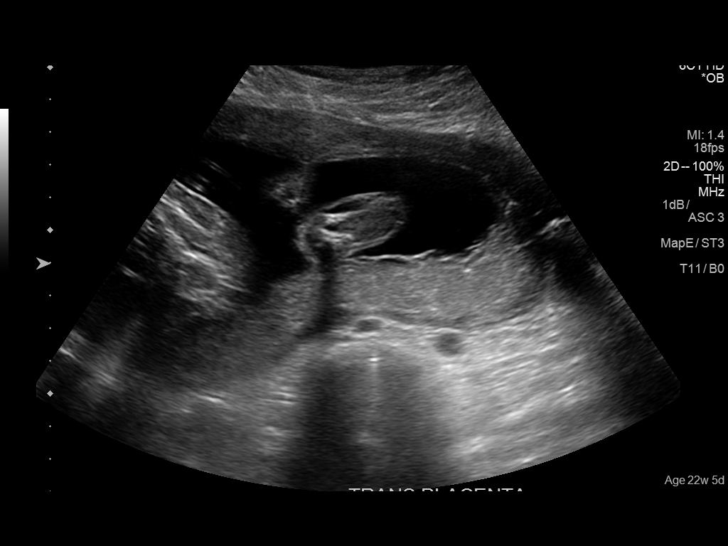

[14 of 15 positions shown; findings below may reference images not displayed]

FINDINGS: Number of Fetuses: 1

Heart Rate:  136 bpm

Movement: Yes

Presentation: Breech

Placental Location: Primarily posterior

Previa: No

Amniotic Fluid (Subjective):  Within normal limits.

BPD:  5.2cm 21w  5d

MATERNAL FINDINGS:

Cervix:  Closed measuring 3.9 cm in length.

Uterus/Adnexae:  No abnormality visualized.
IMPRESSION: Single live intrauterine pregnancy with a measured gestational age
of 21 weeks and 5 days. Biparietal diameter measures 1 week smaller
than expected based on the patient's last ultrasound, dated
08/22/2015, where the biparietal diameter was 4.4 cm consistent with
a 19 week 2 day gestation. Exam otherwise unremarkable.

This exam is performed on an emergent basis and does not
comprehensively evaluate fetal size, dating, or anatomy; follow-up
complete OB US should be considered if further fetal assessment is
warranted.

## 2017-01-30 ENCOUNTER — Emergency Department
Admission: EM | Admit: 2017-01-30 | Discharge: 2017-01-30 | Disposition: A | Discharge status: Home / Self Care | Payer: Medicaid Other | Attending: Emergency Medicine | Admitting: Emergency Medicine

## 2017-01-30 ENCOUNTER — Encounter: Payer: Self-pay | Admitting: Emergency Medicine

## 2017-01-30 DIAGNOSIS — R079 Chest pain, unspecified: Secondary | ICD-10-CM | POA: Diagnosis not present

## 2017-01-30 DIAGNOSIS — Z5321 Procedure and treatment not carried out due to patient leaving prior to being seen by health care provider: Secondary | ICD-10-CM | POA: Diagnosis not present

## 2017-01-30 DIAGNOSIS — Z87891 Personal history of nicotine dependence: Secondary | ICD-10-CM | POA: Diagnosis not present

## 2017-01-30 LAB — CBC
HCT: 38.6 % (ref 35.0–47.0)
Hemoglobin: 12.9 g/dL (ref 12.0–16.0)
MCH: 27.6 pg (ref 26.0–34.0)
MCHC: 33.4 g/dL (ref 32.0–36.0)
MCV: 82.5 fL (ref 80.0–100.0)
PLATELETS: 251 10*3/uL (ref 150–440)
RBC: 4.68 MIL/uL (ref 3.80–5.20)
RDW: 14.9 % — AB (ref 11.5–14.5)
WBC: 8.3 10*3/uL (ref 3.6–11.0)

## 2017-01-30 LAB — URINALYSIS, COMPLETE (UACMP) WITH MICROSCOPIC
BILIRUBIN URINE: NEGATIVE
Glucose, UA: NEGATIVE mg/dL
Hgb urine dipstick: NEGATIVE
Ketones, ur: NEGATIVE mg/dL
Nitrite: NEGATIVE
PH: 7 (ref 5.0–8.0)
Protein, ur: NEGATIVE mg/dL
SPECIFIC GRAVITY, URINE: 1.006 (ref 1.005–1.030)

## 2017-01-30 LAB — BASIC METABOLIC PANEL
Anion gap: 7 (ref 5–15)
BUN: 6 mg/dL (ref 6–20)
CALCIUM: 9.3 mg/dL (ref 8.9–10.3)
CHLORIDE: 108 mmol/L (ref 101–111)
CO2: 24 mmol/L (ref 22–32)
CREATININE: 0.63 mg/dL (ref 0.44–1.00)
GFR calc Af Amer: >60 mL/min (ref >60)
Glucose, Bld: 86 mg/dL (ref 65–99)
Potassium: 3.9 mmol/L (ref 3.5–5.1)
SODIUM: 139 mmol/L (ref 135–145)

## 2017-01-30 LAB — POCT PREGNANCY, URINE: Preg Test, Ur: NEGATIVE

## 2017-01-30 LAB — GLUCOSE, CAPILLARY: GLUCOSE-CAPILLARY: 102 mg/dL — AB (ref 65–99)

## 2017-01-30 NOTE — ED Triage Notes (Signed)
Patient reports woke yesterday with chest congestion and nasal congestion.  Reports "hurts to breathe."  Patient with no acute respiratory distress noted.  Also reports feeling dizzy.

## 2017-03-25 ENCOUNTER — Ambulatory Visit: Payer: Self-pay | Admitting: Nurse Practitioner

## 2017-05-01 ENCOUNTER — Encounter: Payer: Self-pay | Admitting: Nurse Practitioner

## 2017-05-01 ENCOUNTER — Ambulatory Visit (INDEPENDENT_AMBULATORY_CARE_PROVIDER_SITE_OTHER): Payer: Medicaid Other | Admitting: Nurse Practitioner

## 2017-05-01 VITALS — BP 107/51 | HR 82 | Temp 98.1°F | Ht 63.0 in | Wt 162.8 lb

## 2017-05-01 DIAGNOSIS — F32A Depression, unspecified: Secondary | ICD-10-CM

## 2017-05-01 DIAGNOSIS — F329 Major depressive disorder, single episode, unspecified: Secondary | ICD-10-CM

## 2017-05-01 DIAGNOSIS — F419 Anxiety disorder, unspecified: Secondary | ICD-10-CM

## 2017-05-01 DIAGNOSIS — R5383 Other fatigue: Secondary | ICD-10-CM

## 2017-05-01 LAB — CBC
HCT: 40.8 % (ref 35.0–45.0)
Hemoglobin: 13 g/dL (ref 11.7–15.5)
MCH: 27.7 pg (ref 27.0–33.0)
MCHC: 31.9 g/dL — ABNORMAL LOW (ref 32.0–36.0)
MCV: 86.8 fL (ref 80.0–100.0)
MPV: 10.1 fL (ref 7.5–12.5)
Platelets: 230 10*3/uL (ref 140–400)
RBC: 4.7 MIL/uL (ref 3.80–5.10)
RDW: 14.8 % (ref 11.0–15.0)
WBC: 5 10*3/uL (ref 3.8–10.8)

## 2017-05-01 LAB — TSH: TSH: 0.8 mIU/L

## 2017-05-01 MED ORDER — ESCITALOPRAM OXALATE 10 MG PO TABS: 10.0000 mg | ORAL_TABLET | Freq: Every day | ORAL | 1 refills | Status: DC

## 2017-05-01 NOTE — Progress Notes (Signed)
Subjective:    Patient ID: Kimberly Mosley, female    DOB: 01-18-1996, 21 y.o.   MRN: [id number]  Kimberly Mosley is a 21 y.o. female presenting on 05/01/2017 for Establish Care (possible depression, fatigue.)   HPI  Establish Care New Provider Pt last seen by PCP many years ago.  OB-GYN records are more updated.  Obtain via [employer] for [facility].   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  Had life and energy during pregnancy and until resuming work.  Only had low energy w/ bleeding during pregnancy.  Now gets mad and irritated at work.  Thinks it is depression.  In past week has felt better, but feels exhausted Would like to have energy like before pregnancy, self confidence, feel like herself.  Now has a "reality of having a kid, but not spending time with him."  - Does not worry about son when at work. - Sleeping 8-9 hours per night but doesn't feel like she is getting good sleep. - Walks 30-60 mins per day - Contraception - Nexplanon - Supportive home environment w/ boyfriend who is baby's father  Pt reinforces her most significant symptom is fatigue.  Depression screen PHQ 2/9 05/01/2017  Decreased Interest 2  Down, Depressed, Hopeless 1  PHQ - 2 Score 3  Altered sleeping 1  Tired, decreased energy 2  Feeling bad or failure about yourself  1  Trouble concentrating 2  Moving slowly or fidgety/restless 1  Suicidal thoughts 1  PHQ-9 Score 11   GAD 7 : Generalized Anxiety Score 05/01/2017  Nervous, Anxious, on Edge 0  Control/stop worrying 0  Worry too much - different things 1  Trouble relaxing 0  Restless 0  Easily annoyed or irritable 0  Afraid - awful might happen 1  Total GAD 7 Score 2  Anxiety Difficulty Somewhat difficult    Past Medical History:  Diagnosis Date  . Anemia   . Anxiety    situational  . H/O Bell's palsy   . Medical history  non-contributory    Past Surgical History:  Procedure Laterality Date  . NO PAST SURGERIES     Social History   Social History  . Marital status: Single    Spouse name: N/A  . Number of children: N/A  . Years of education: N/A   Occupational History  . Not on file.   Social History Main Topics  . Smoking status: Current Every Day Smoker    Packs/day: 0.50    Years: 5.00    Types: Cigarettes  . Smokeless tobacco: Former Neurosurgeon     Comment: pt states she quit smoking yesterday.  Has only smoked 1 year - was able to quit during pregnancy.     . Alcohol use No  . Drug use: No  . Sexual activity: Yes    Birth control/ protection: Implant   Other Topics Concern  . Not on file   Social History Narrative  . No narrative on file   Family History  Problem Relation Age of Onset  . Depression Father   . Bipolar disorder Father   . Healthy Mother   . Healthy Sister   . Healthy Brother   . Cancer Maternal Uncle        pancreatic cancer  . Heart disease Maternal Grandmother   . Stroke Maternal Grandmother   . Healthy Maternal Grandfather   . Breast  cancer Neg Hx   . Colon cancer Neg Hx   . Ovarian cancer Neg Hx    Current Outpatient Prescriptions on File Prior to Visit  Medication Sig  . fluticasone (FLONASE) 50 MCG/ACT nasal spray Place 2 sprays into both nostrils daily. (Patient not taking: Reported on 05/01/2017)  . valACYclovir (VALTREX) 1000 MG tablet Take 1 tablet (1,000 mg total) by mouth 3 (three) times daily. (Patient not taking: Reported on 05/01/2017)   No current facility-administered medications on file prior to visit.     Review of Systems  Constitutional: Positive for activity change and fatigue.  HENT: Negative.   Eyes: Negative.   Respiratory: Negative.   Cardiovascular: Negative.   Gastrointestinal: Negative.   Endocrine: Negative.   Genitourinary: Negative.   Musculoskeletal: Negative.   Skin: Negative.   Allergic/Immunologic: Negative.     Neurological: Negative.   Hematological: Negative.   Psychiatric/Behavioral: Negative.    Per HPI unless specifically indicated above      Objective:    BP (!) 107/51 (BP Location: Right Arm, Patient Position: Sitting, Cuff Size: Normal)   Pulse 82   Temp 98.1 F (36.7 C) (Oral)   Ht 5\' 3"  (1.6 m)   Wt 162 lb 12.8 oz (73.8 kg)   BMI 28.84 kg/m   Wt Readings from Last 3 Encounters:  05/01/17 162 lb 12.8 oz (73.8 kg)  01/30/17 160 lb (72.6 kg)  07/12/16 140 lb (63.5 kg)    Physical Exam General - overweight, well-appearing, NAD HEENT - Normocephalic, atraumatic Neck - supple, non-tender, no LAD, no thyromegaly Heart - RRR, no murmurs heard Lungs - Clear throughout all lobes, no wheezing, crackles, or rhonchi. Normal work of breathing. Abdomen - soft, NTND, no masses, no hepatosplenomegaly, active bowel sounds Extremeties - non-tender, no edema, cap refill < 2 seconds, peripheral pulses intact +2 bilaterally Skin - warm, dry, no rashes Neuro - awake, alert, oriented x3, normal gait Psych - Normal to sad mood and normal affect, normal behavior Pt engages in conversation and is able to make eye contact.  Results for orders placed or performed in visit on 05/01/17  TSH  Result Value Ref Range   TSH 0.80 mIU/L  Vitamin D (25 hydroxy)  Result Value Ref Range   Vit D, 25-Hydroxy 30 30 - 100 ng/mL  CBC  Result Value Ref Range   WBC 5.0 3.8 - 10.8 K/uL   RBC 4.70 3.80 - 5.10 MIL/uL   Hemoglobin 13.0 11.7 - 15.5 g/dL   HCT 16.140.8 09.635.0 - 04.545.0 %   MCV 86.8 80.0 - 100.0 fL   MCH 27.7 27.0 - 33.0 pg   MCHC 31.9 (L) 32.0 - 36.0 g/dL   RDW 40.914.8 81.111.0 - 91.415.0 %   Platelets 230 140 - 400 K/uL   MPV 10.1 7.5 - 12.5 fL  Magnesium  Result Value Ref Range   Magnesium 2.0 1.5 - 2.5 mg/dL  BASIC METABOLIC PANEL WITH GFR  Result Value Ref Range   Sodium 140 135 - 146 mmol/L   Potassium 4.1 3.5 - 5.3 mmol/L   Chloride 110 98 - 110 mmol/L   CO2 20 20 - 31 mmol/L   Glucose, Bld 85 65 -  99 mg/dL   BUN 7 7 - 25 mg/dL   Creat 7.820.60 9.560.50 - 2.131.10 mg/dL   Calcium 9.0 8.6 - 08.610.2 mg/dL   GFR, Est African American >89 >=60 mL/min   GFR, Est Non African American >89 >=60 mL/min  Assessment & Plan:   Problem List Items Addressed This Visit      Other   Depression - Primary    New diagnosis: Subacute depression onset after returning to work. Likely association to separation from child and role reassignment.  Pt w/ predominant symptom of fatigue.  Denies SI/HI and has no plans to carry out if SI/HI arise.  Plan: 1. START escitalopram 10 mg once daily. 2. Encourage regular sleep schedule w/ appropriate sleep hygiene. 3. 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  Provided stress management strategies. 4. Follow up 4 weeks.      Relevant Medications   escitalopram (LEXAPRO) 10 MG tablet    Other Visit Diagnoses    Fatigue, unspecified type     Subacute fatigue likely associated w/ depression.   Plan: 1. Rule out medical explanation for fatigue w/ labs.  All were normal and not needing treatment. 2. Follow up as needed.  Treat depression.   Relevant Orders   TSH (Completed)   Vitamin D (25 hydroxy) (Completed)   CBC (Completed)   Magnesium (Completed)   BASIC METABOLIC PANEL WITH GFR (Completed)      Meds ordered this encounter  Medications  . etonogestrel (NEXPLANON) 68 MG IMPL implant    Sig: 1 each by Subdermal route once.  . escitalopram (LEXAPRO) 10 MG tablet    Sig: Take 1 tablet (10 mg total) by mouth daily.    Dispense:  30 tablet    Refill:  1    Order Specific Question:   Supervising Provider    Answer:   Smitty Cords [[id number]]      Follow up plan: Return in about 4 weeks (around 05/29/2017) for depression.  [name], DNP, AGPCNP-BC Adult Gerontology Primary Care Nurse Practitioner [facility] 05/04/2017, 5:46 PM

## 2017-05-01 NOTE — Patient Instructions (Addendum)
Kimberly Mosley, Thank you for coming in to clinic today.  1. For your depression: - Start escitalopram 10 mg tablet: - Take 1/2 tablet for 1 week, then take the whole tablet.   2. We will do labs today to check other causes of fatigue  Please schedule a follow-up appointment with Cassell Smiles, AGNP to Return in about 4 weeks (around 05/29/2017) for depression.  If you have any other questions or concerns, please feel free to call the clinic or send a message through [place]. You may also schedule an earlier appointment if necessary.  Cassell Smiles, DNP, AGNP-BC Adult Gerontology Nurse Practitioner [facility], [facility]    Stress and Stress Management Stress is a normal reaction to life events. It is what you feel when life demands more than you are used to or more than you can handle. Some stress can be useful. For example, the stress reaction can help you catch the last bus of the day, study for a test, or meet a deadline at work. But stress that occurs too often or for too long can cause problems. It can affect your emotional health and interfere with relationships and normal daily activities. Too much stress can weaken your immune system and increase your risk for physical illness. If you already have a medical problem, stress can make it worse. What are the causes? All sorts of life events may cause stress. An event that causes stress for one person may not be stressful for another person. Major life events commonly cause stress. These may be positive or negative. Examples include losing your job, moving into a new home, getting married, having a baby, or losing a loved one. Less obvious life events may also cause stress, especially if they occur day after day or in combination. Examples include working long hours, driving in traffic, caring for children, being in debt, or being in a difficult relationship. What are the signs or symptoms? Stress may cause emotional symptoms  including, the following:  Anxiety. This is feeling worried, afraid, on edge, overwhelmed, or out of control.  Anger. This is feeling irritated or impatient.  Depression. This is feeling sad, down, helpless, or guilty.  Difficulty focusing, remembering, or making decisions.  Stress may cause physical symptoms, including the following:  Aches and pains. These may affect your head, neck, back, stomach, or other areas of your body.  Tight muscles or clenched jaw.  Low energy or trouble sleeping.  Stress may cause unhealthy behaviors, including the following:  Eating to feel better (overeating) or skipping meals.  Sleeping too little, too much, or both.  Working too much or putting off tasks (procrastination).  Smoking, drinking alcohol, or using drugs to feel better.  How is this diagnosed? Stress is diagnosed through an assessment by your health care provider. Your health care provider will ask questions about your symptoms and any stressful life events.Your health care provider will also ask about your medical history and may order blood tests or other tests. Certain medical conditions and medicine can cause physical symptoms similar to stress. Mental illness can cause emotional symptoms and unhealthy behaviors similar to stress. Your health care provider may refer you to a mental health professional for further evaluation. How is this treated? Stress management is the recommended treatment for stress.The goals of stress management are reducing stressful life events and coping with stress in healthy ways. Techniques for reducing stressful life events include the following:  Stress identification. Self-monitor for stress and identify what causes  stress for you. These skills may help you to avoid some stressful events.  Time management. Set your priorities, keep a calendar of events, and learn to say "no." These tools can help you avoid making too many commitments.  Techniques  for coping with stress include the following:  Rethinking the problem. Try to think realistically about stressful events rather than ignoring them or overreacting. Try to find the positives in a stressful situation rather than focusing on the negatives.  Exercise. Physical exercise can release both physical and emotional tension. The key is to find a form of exercise you enjoy and do it regularly.  Relaxation techniques. These relax the body and mind. Examples include yoga, meditation, tai chi, biofeedback, deep breathing, progressive muscle relaxation, listening to music, being out in nature, journaling, and other hobbies. Again, the key is to find one or more that you enjoy and can do regularly.  Healthy lifestyle. Eat a balanced diet, get plenty of sleep, and do not smoke. Avoid using alcohol or drugs to relax.  Strong support network. Spend time with family, friends, or other people you enjoy being around.Express your feelings and talk things over with someone you trust.  Counseling or talktherapy with a mental health professional may be helpful if you are having difficulty managing stress on your own. Medicine is typically not recommended for the treatment of stress.Talk to your health care provider if you think you need medicine for symptoms of stress. Follow these instructions at home:  Keep all follow-up visits as directed by your health care provider.  Take all medicines as directed by your health care provider. Contact a health care provider if:  Your symptoms get worse or you start having new symptoms.  You feel overwhelmed by your problems and can no longer manage them on your own. Get help right away if:  You feel like hurting yourself or someone else. This information is not intended to replace advice given to you by your health care provider. Make sure you discuss any questions you have with your health care provider. Document Released: 04/03/2001 Document Revised:  03/15/2016 Document Reviewed: 06/02/2013 Elsevier Interactive Patient Education  2017 [employer].

## 2017-05-02 ENCOUNTER — Telehealth: Payer: Self-pay | Admitting: Nurse Practitioner

## 2017-05-02 LAB — BASIC METABOLIC PANEL WITH GFR
BUN: 7 mg/dL (ref 7–25)
CO2: 20 mmol/L (ref 20–31)
Calcium: 9 mg/dL (ref 8.6–10.2)
Chloride: 110 mmol/L (ref 98–110)
Creat: 0.6 mg/dL (ref 0.50–1.10)
GFR, Est African American: >89 mL/min (ref >=60)
GFR, Est Non African American: >89 mL/min (ref >=60)
Glucose, Bld: 85 mg/dL (ref 65–99)
Potassium: 4.1 mmol/L (ref 3.5–5.3)
Sodium: 140 mmol/L (ref 135–146)

## 2017-05-02 LAB — VITAMIN D 25 HYDROXY (VIT D DEFICIENCY, FRACTURES): Vit D, 25-Hydroxy: 30 ng/mL (ref 30–100)

## 2017-05-02 LAB — MAGNESIUM: Magnesium: 2 mg/dL (ref 1.5–2.5)

## 2017-05-02 NOTE — Telephone Encounter (Signed)
Pt asked if someone would call and explain labs [phone]

## 2017-05-02 NOTE — Telephone Encounter (Signed)
The pt was notified. No questions or concerns. 

## 2017-05-04 NOTE — Assessment & Plan Note (Signed)
Subacute depression onset after returning to work.  Likely association to separation from child and role reassignment.  Pt w/ predominant symptom of fatigue.  Plan: 1. START escitalopram 10 mg once daily. 2. Encourage regular sleep schedule w/ appropriate sleep hygiene. 3. 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  Provided stress management strategies. 4. Follow up 4 weeks.

## 2017-05-04 NOTE — Progress Notes (Signed)
I have reviewed this encounter including the documentation in this note and/or discussed this patient with the provider, Lauren Kennedy, AGPCNP-BC. I am certifying that I agree with the content of this note as supervising physician.  Zekiel Torian, DO [facility] 05/04/2017, 8:23 PM 

## 2017-05-29 ENCOUNTER — Ambulatory Visit: Payer: Self-pay | Admitting: Nurse Practitioner

## 2017-07-30 ENCOUNTER — Ambulatory Visit
Admission: RE | Admit: 2017-07-30 | Discharge: 2017-07-30 | Disposition: A | Discharge status: Home / Self Care | Payer: Medicaid Other | Source: Ambulatory Visit | Attending: Nurse Practitioner | Admitting: Nurse Practitioner

## 2017-07-30 ENCOUNTER — Ambulatory Visit (INDEPENDENT_AMBULATORY_CARE_PROVIDER_SITE_OTHER): Payer: Medicaid Other | Admitting: Nurse Practitioner

## 2017-07-30 ENCOUNTER — Encounter: Payer: Self-pay | Admitting: Nurse Practitioner

## 2017-07-30 VITALS — BP 109/58 | HR 79 | Temp 97.9°F | Ht 63.0 in | Wt 161.8 lb

## 2017-07-30 DIAGNOSIS — W540XXA Bitten by dog, initial encounter: Secondary | ICD-10-CM | POA: Diagnosis not present

## 2017-07-30 DIAGNOSIS — L089 Local infection of the skin and subcutaneous tissue, unspecified: Secondary | ICD-10-CM | POA: Diagnosis not present

## 2017-07-30 DIAGNOSIS — S61259A Open bite of unspecified finger without damage to nail, initial encounter: Principal | ICD-10-CM

## 2017-07-30 DIAGNOSIS — S61451A Open bite of right hand, initial encounter: Principal | ICD-10-CM

## 2017-07-30 DIAGNOSIS — S61256A Open bite of right little finger without damage to nail, initial encounter: Secondary | ICD-10-CM | POA: Diagnosis present

## 2017-07-30 MED ORDER — AMOXICILLIN-POT CLAVULANATE 875-125 MG PO TABS: 1.0000 | ORAL_TABLET | Freq: Two times a day (BID) | ORAL | 0 refills | Status: AC

## 2017-07-30 NOTE — Progress Notes (Signed)
Subjective:    Patient ID: Kimberly Mosley, female    DOB: [birth year], 21 y.o.   MRN: [id number]  Kimberly Mosley is a 21 y.o. female presenting on 07/30/2017 for Animal Bite (dog bite yesterday on the Right fifth finger, unable to move the finger.)   HPI Dog Bite to R 5th finger Yesterday went to shelter had dog bite by a [place]/terrier mix.  Affected her right 5th finger. Has taken  Unable to bend w/o pain.  Feels like she may have a broken finger at the tip since she has had increased pain and decreased mobility. Pain is described as sharp and occasionally throbbing.  Pt requests Xray of finger.  Is using Nexplanon for contraception.  Social History  Substance Use Topics  . Smoking status: Current Every Day Smoker    Packs/day: 0.50    Years: 5.00    Types: Cigarettes  . Smokeless tobacco: Former Neurosurgeon     Comment: pt states she quit smoking yesterday.  Has only smoked 1 year - was able to quit during pregnancy.     . Alcohol use No    Review of Systems Per HPI unless specifically indicated above     Objective:    BP (!) 109/58 (BP Location: Right Arm, Patient Position: Sitting, Cuff Size: Normal)   Pulse 79   Temp 97.9 F (36.6 C) (Oral)   Ht  (1.6 m)   Wt 161 lb 12.8 oz (73.4 kg)   BMI 28.66 kg/m   Wt Readings from Last 3 Encounters:  07/30/17 161 lb 12.8 oz (73.4 kg)  05/01/17 162 lb 12.8 oz (73.8 kg)  01/30/17 160 lb (72.6 kg)    Physical Exam  General - overweight, well-appearing, NAD HEENT - Normocephalic, atraumatic Neck - supple, non-tender, no LAD, no thyromegaly Heart - RRR, no murmurs heard Lungs - Clear throughout all lobes, no wheezing, crackles, or rhonchi. Normal work of breathing. Extremeties - non-tender, no edema, cap refill < 2 seconds, peripheral pulses intact +2 bilaterally Musculoskeletal - Right Hand Inspection: normal and symmetrical except right 5th finger (edema, erythema, ecchymosis, puncture marks located at anterior/posterior DIP  joint) Palpation: tender to palpation A/P, but not laterally ROM: decreased PROM/AROM of DIP joint.  All other joints w/o limitation. Strength: reduced grip strength of 5th finger, others normal.  Neurovascular: normal sensation w/ good cap refill < 2 seconds Skin - warm, dry Neuro - awake, alert, oriented x3,  normal gait Psych - Normal mood and affect, normal behavior    Results for orders placed or performed in visit on 05/01/17  TSH  Result Value Ref Range   TSH 0.80 mIU/L  Vitamin D (25 hydroxy)  Result Value Ref Range   Vit D, 25-Hydroxy 30 30 - 100 ng/mL  CBC  Result Value Ref Range   WBC 5.0 3.8 - 10.8 K/uL   RBC 4.70 3.80 - 5.10 MIL/uL   Hemoglobin 13.0 11.7 - 15.5 g/dL   HCT 14.7 82.9 - 56.2 %   MCV 86.8 80.0 - 100.0 fL   MCH 27.7 27.0 - 33.0 pg   MCHC 31.9 (L) 32.0 - 36.0 g/dL   RDW 13.0 86.5 - 78.4 %   Platelets 230 140 - 400 K/uL   MPV 10.1 7.5 - 12.5 fL  Magnesium  Result Value Ref Range   Magnesium 2.0 1.5 - 2.5 mg/dL  BASIC METABOLIC PANEL WITH GFR  Result Value Ref Range   Sodium 140 135 - 146 mmol/L  Potassium 4.1 3.5 - 5.3 mmol/L   Chloride 110 98 - 110 mmol/L   CO2 20 20 - 31 mmol/L   Glucose, Bld 85 65 - 99 mg/dL   BUN 7 7 - 25 mg/dL   Creat 4.09 8.11 - 9.14 mg/dL   Calcium 9.0 8.6 - 78.2 mg/dL   GFR, Est African American >89 >=60 mL/min   GFR, Est Non African American >89 >=60 mL/min      Assessment & Plan:   Problem List Items Addressed This Visit    None    Visit Diagnoses    Dog bite of right hand including fingers with infection, initial encounter    -  Primary Acute injury w/ likely infection s/p dog bite to right 5th finger at dip joint.  No obvious bony injury, but cannot be excluded w/o Xray.  Possible soft tissue injury, but difficult to assess 2/2 edema.  Plan: 1. Treat with NSAIDs (acetaminophen and ibuprofen).  Discussed alternate dosing and max dosing. 2. Apply heat and/or ice to affected area.  Can use finger splint if  concerned for pain w/ job.  Should not immobilize while not at work. 3. High possiblity for infection (cellulitis and osteomyelitis) w/ animal bites.  Dog bite should be treated w/ Augmentin 875-125 mg bid x 7 days.   4. X ray of Right 5th finger. 5. Follow up 3-5 days as needed.   Relevant Medications   amoxicillin-clavulanate (AUGMENTIN) 875-125 MG tablet   Other Relevant Orders   DG Finger Little Right (Completed)      Meds ordered this encounter  Medications  . amoxicillin-clavulanate (AUGMENTIN) 875-125 MG tablet    Sig: Take 1 tablet by mouth 2 (two) times daily.    Dispense:  14 tablet    Refill:  0    Order Specific Question:   Supervising Provider    Answer:   Smitty Cords [[id number]]     Follow up plan: Return 3-5 days if symptoms worsen or fail to improve.  [name], DNP, AGPCNP-BC Adult Gerontology Primary Care Nurse Practitioner [facility] 08/04/2017, 5:19 PM

## 2017-07-30 NOTE — Patient Instructions (Addendum)
Kimberly Mosley, Thank you for coming in to clinic today.  1. For protecting your finger, you can wear a finger splint.  You can get this at any pharmacy.  Use bandage tape to secure this to your finger. - Use ice.  Apply this for 15 minutes at a time 6-8 times per day.   - Start taking Ibuprofen as well if tolerated 200-400mg  every 8 hours as needed.  May also takeTylenol extra strength 1 to 2 tablets every 6-8 hours for aches or fever/chills for next few days as needed.  Do not take more than 3,000 mg in 24 hours from all medicines.  - In about 3-5 days, if you have any trouble with moving your finger, call clinic for an orthopedic referral.  2. For your finger xray, I will call if there is a broken bone.    Please schedule a follow-up appointment with [name], AGNP. Return 3-5 days if symptoms worsen or fail to improve.  If you have any other questions or concerns, please feel free to call the clinic or send a message through MyChart. You may also schedule an earlier appointment if necessary.  You will receive a survey after today's visit either digitally by e-mail or paper by [employer]. Your experiences and feedback matter to [place].  Please respond so we know how we are doing as we provide care for you.   [name], DNP, AGNP-BC Adult Gerontology Nurse Practitioner [facility], [facility]   Animal Bite Animal bites can range from mild to serious. An animal bite can result in a scratch on the skin, a deep open cut, a puncture of the skin, a crush injury, or tearing away of the skin or a body part. A small bite from a house pet will usually not cause serious problems. However, some animal bites can become infected or injure a bone or other tissue. Bites from certain animals can be more dangerous because of the risk of spreading rabies, which is a serious viral infection. This risk is higher with bites from stray animals or wild animals, such as raccoons, foxes, skunks, and bats.  Dogs are responsible for most animal bites. Children are bitten more often than adults. What are the signs or symptoms? Common symptoms of an animal bite include:  Pain.  Bleeding.  Swelling.  Bruising.  How is this diagnosed? This condition may be diagnosed based on a physical exam and medical history. Your health care provider will examine the wound and ask for details about the animal and how the bite happened. You may also have tests, such as:  Blood tests to check for infection or to determine if surgery is needed.  X-rays to check for damage to bones or joints.  Culture test. This uses a sample of fluid from the wound to check for infection.  How is this treated? Treatment varies depending on the location and type of animal bite and your medical history. Treatment may include:  Wound care. This often includes cleaning the wound, flushing the wound with saline solution, and applying a bandage (dressing). Sometimes, the wound is left open to heal because of the high risk of infection. However, in some cases, the wound may be closed with stitches (sutures), staples, skin glue, or adhesive strips.  Antibiotic medicine.  Tetanus shot.  Rabies treatment if the animal could have rabies.  In some cases, bites that have become infected may require IV antibiotics and surgical treatment in the hospital. Follow these instructions at  home: Wound care  Follow instructions from your health care provider about how to take care of your wound. Make sure you: ? Wash your hands with soap and water before you change your dressing. If soap and water are not available, use hand sanitizer. ? Change your dressing as told by your health care provider. ? Leave sutures, skin glue, or adhesive strips in place. These skin closures may need to be in place for 2 weeks or longer. If adhesive strip edges start to loosen and curl up, you may trim the loose edges. Do not remove adhesive strips completely  unless your health care provider tells you to do that.  Check your wound every day for signs of infection. Watch for: ? Increasing redness, swelling, or pain. ? Fluid, blood, or pus. General instructions  Take or apply over-the-counter and prescription medicines only as told by your health care provider.  If you were prescribed an antibiotic, take or apply it as told by your health care provider. Do not stop using the antibiotic even if your condition improves.  Keep the injured area raised (elevated) above the level of your heart while you are sitting or lying down, if this is possible.  If directed, apply ice to the injured area. ? Put ice in a plastic bag. ? Place a towel between your skin and the bag. ? Leave the ice on for 20 minutes, 2-3 times per day.  Keep all follow-up visits as told by your health care provider. This is important. Contact a health care provider if:  You have increasing redness, swelling, or pain at the site of your wound.  You have a general feeling of sickness (malaise).  You feel nauseous or you vomit.  You have pain that does not get better. Get help right away if:  You have a red streak extending away from your wound.  You have fluid, blood, or pus coming from your wound.  You have a fever or chills.  You have trouble moving your injured area.  You have numbness or tingling extending beyond the wound. This information is not intended to replace advice given to you by your health care provider. Make sure you discuss any questions you have with your health care provider. Document Released: 06/26/2011 Document Revised: 02/15/2016 Document Reviewed: 02/23/2015 Elsevier Interactive Patient Education  [employer].

## 2017-08-12 ENCOUNTER — Telehealth: Payer: Self-pay | Admitting: Nurse Practitioner

## 2017-08-12 DIAGNOSIS — Z01419 Encounter for gynecological examination (general) (routine) without abnormal findings: Secondary | ICD-10-CM

## 2017-08-12 DIAGNOSIS — Z3041 Encounter for surveillance of contraceptive pills: Secondary | ICD-10-CM

## 2017-08-12 NOTE — Telephone Encounter (Signed)
Pt said she needed a referral to [facility].  Her call back number is [phone]

## 2017-08-12 NOTE — Telephone Encounter (Signed)
The pt is requesting a referral to [facility] for a annual visit w/ Liane Combereborah Brady, NP. They currently manage her birth control. She needs an referral because she have medicaid insurance. Please advise [facility] [phone] phone# [phone] fax#

## 2017-08-12 NOTE — Telephone Encounter (Signed)
Pt has Implant

## 2017-08-14 NOTE — Telephone Encounter (Signed)
Referral was placed 

## 2017-08-20 ENCOUNTER — Telehealth: Payer: Self-pay

## 2017-08-20 ENCOUNTER — Ambulatory Visit (INDEPENDENT_AMBULATORY_CARE_PROVIDER_SITE_OTHER): Payer: Medicaid Other | Admitting: Nurse Practitioner

## 2017-08-20 ENCOUNTER — Encounter: Payer: Self-pay | Admitting: Nurse Practitioner

## 2017-08-20 VITALS — BP 107/60 | HR 73 | Temp 98.1°F | Ht 63.0 in | Wt 165.6 lb

## 2017-08-20 DIAGNOSIS — S29012A Strain of muscle and tendon of back wall of thorax, initial encounter: Secondary | ICD-10-CM

## 2017-08-20 DIAGNOSIS — M549 Dorsalgia, unspecified: Secondary | ICD-10-CM | POA: Diagnosis not present

## 2017-08-20 LAB — POCT URINALYSIS DIPSTICK
Bilirubin, UA: NEGATIVE
Blood, UA: NEGATIVE
Glucose, UA: NEGATIVE
Ketones, UA: NEGATIVE
Nitrite, UA: NEGATIVE
Protein, UA: NEGATIVE
Spec Grav, UA: 1.015 (ref 1.010–1.025)
Urobilinogen, UA: 0.2 E.U./dL
pH, UA: 6 (ref 5.0–8.0)

## 2017-08-20 MED ORDER — BACLOFEN 10 MG PO TABS: 10.0000 mg | ORAL_TABLET | Freq: Three times a day (TID) | ORAL | 0 refills | Status: DC

## 2017-08-20 MED ORDER — NAPROXEN 500 MG PO TABS: 500.0000 mg | ORAL_TABLET | Freq: Two times a day (BID) | ORAL | 0 refills | Status: AC

## 2017-08-20 NOTE — Progress Notes (Signed)
Subjective:    Patient ID: Clarene EssexHeidi M Lees, female    DOB: 1996-01-11, 21 y.o.   MRN: [id number]  Clarene EssexHeidi M Rumpf is a 21 y.o. female presenting on 08/20/2017 for Back Pain (left flank and back pain x 4 days )   HPI Back Pain Friday around Midnight started having back pain w/ sharp pain state as 9/10.  She wanted to roll into ball.  Had difficulty bending forward or moving in any way. - No new lifting or other known injury.   - Feels like her "kidney is swollen."  Pressure and feels like "something is sitting over it."  Pain w/ taking her breath away.  Worse with movement.   - No prior kidney stones. - No dysuria, urinates "normally," no urinary frequency.    Social History  Substance Use Topics  . Smoking status: Current Every Day Smoker    Packs/day: 0.10    Years: 5.00    Types: Cigarettes  . Smokeless tobacco: Former NeurosurgeonUser  . Alcohol use No    Review of Systems Per HPI unless specifically indicated above     Objective:    BP 107/60 (BP Location: Right Arm, Patient Position: Sitting, Cuff Size: Normal)   Pulse 73   Temp 98.1 F (36.7 C) (Oral)   Ht 5\' 3"  (1.6 m)   Wt 165 lb 9.6 oz (75.1 kg)   BMI 29.33 kg/m   Wt Readings from Last 3 Encounters:  08/20/17 165 lb 9.6 oz (75.1 kg)  07/30/17 161 lb 12.8 oz (73.4 kg)  05/01/17 162 lb 12.8 oz (73.8 kg)    Physical Exam  General - overweight, well-appearing, NAD HEENT - Normocephalic, atraumatic Heart - RRR, no murmurs heard Lungs - Clear throughout all lobes, no wheezing, crackles, or rhonchi. Normal work of breathing. Musculoskeletal - Low Back Inspection: Normal appearance, poor posture w/ stooped thoracic back (kyphosis not permanent), no spinal deformity, symmetrical. Palpation: No tenderness over spinous processes. Bilateral lumbar paraspinal muscles tender and with hypertonicity/spasm L>R ROM: Full active ROM forward flex / back extension, rotation L/R without discomfort Special Testing: Seated SLR negative for  radicular pain bilaterally and negative for reproducible pain Strength: Bilateral hip flex/ext 5/5, knee flex/ext 5/5, ankle dorsiflex/plantarflex 5/5 Neurovascular: intact distal sensation to light touch  Extremeties - non-tender, no edema, cap refill < 2 seconds, peripheral pulses intact +2 bilaterally Skin - warm, dry Neuro - awake, alert, oriented x3, normal gait Psych - Normal mood and affect, normal behavior    Results for orders placed or performed in visit on 08/20/17  POCT Urinalysis Dipstick  Result Value Ref Range   Color, UA yellow    Clarity, UA clear    Glucose, UA neg    Bilirubin, UA neg    Ketones, UA neg    Spec Grav, UA 1.015 1.010 - 1.025   Blood, UA neg    pH, UA 6.0 5.0 - 8.0   Protein, UA neg    Urobilinogen, UA 0.2 0.2 or 1.0 E.U./dL   Nitrite, UA neg    Leukocytes, UA Trace (A) Negative      Assessment & Plan:   Problem List Items Addressed This Visit    None    Visit Diagnoses    Acute left-sided back pain, unspecified back location    -  Primary Pain likely self-limited.  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  Pt has Implant for contraception, so very  low concern for ectopic pregnancy.  Negative CVAT and urinalysis so UTI/pyelonephritis, kidney stone are of very low concern.  Plan:  1. Treat with OTC pain medications (acetaminophen and naproxen).  Discussed alternate dosing and max dosing. 2. Apply heat and/or ice to affected area. 3. May also apply a muscle rub with lidocaine after heat or ice. 4. Take muscle relaxer baclofen up to three times daily.  Cautioned drowsiness. 5. Follow up 2-6 weeks as needed for worsening or persistent symptoms.    Relevant Orders   POCT Urinalysis Dipstick (Completed)      Meds ordered this encounter  Medications  . DOCOSAHEXAENOIC ACID PO    Sig: Take by mouth.  . baclofen (LIORESAL) 10 MG tablet    Sig: Take 1 tablet (10 mg total) by mouth 3  (three) times daily.    Dispense:  30 each    Refill:  0    Order Specific Question:   Supervising Provider    Answer:   Smitty Cords [[id number]]  . naproxen (NAPROSYN) 500 MG tablet    Sig: Take 1 tablet (500 mg total) by mouth 2 (two) times daily with a meal.    Dispense:  28 tablet    Refill:  0    Order Specific Question:   Supervising Provider    Answer:   Smitty Cords [[id number]]      Follow up plan: No Follow-up on file.  [name], DNP, AGPCNP-BC Adult Gerontology Primary Care Nurse Practitioner [facility] 08/20/2017, 3:58 PM

## 2017-08-20 NOTE — Patient Instructions (Addendum)
Lagina, Thank you for coming in to clinic today.  1. You likely have a muscle strain.  It may take up to 6 weeks for this to completely heal. - START taking baclofen 10 mg tablet.  May take up to three times daily as needed for muscle pain/spasm.  Be careful as it may cause you to be sleepy.  You may only need to take it at bedtime.  - START taking naproxen sodium 500 mg twice daily for pain.  May also take Tylenol extra strength 1 to 2 tablets every 6-8 hours for aches or fever/chills for next few days as needed.  Do not take more than 3,000 mg in 24 hours from all medicines.   - Use heat and ice.  Apply this for 15 minutes at a time 6-8 times per day.   - Muscle rub with lidocaine.  Avoid using this with heat and ice to avoid burns.  - Avoid lifting, pulling and causing re-injury to your muscle.  Please schedule a follow-up appointment with Wilhelmina McardleLauren Florencia Zaccaro, AGNP. Return if symptoms worsen or fail to improve.  If you have any other questions or concerns, please feel free to call the clinic or send a message through MyChart. You may also schedule an earlier appointment if necessary.  You will receive a survey after today's visit either digitally by e-mail or paper by [employer] mail. Your experiences and feedback matter to us.  Please respond so we know how we are doing as we provide care for you.   Wilhelmina McardleLauren Cyree Chuong, DNP, AGNP-BC Adult Gerontology Nurse Practitioner [facility], [facility]

## 2017-08-21 NOTE — Telephone Encounter (Signed)
Pt called complaining of left side back pain. She was scheduled for a same day appt.

## 2017-09-18 ENCOUNTER — Telehealth: Payer: Self-pay | Admitting: Nurse Practitioner

## 2017-09-18 NOTE — Telephone Encounter (Signed)
Pt said the skin on her hand has been extremely dry for the past 3 days.  Please call [phone]

## 2017-09-19 NOTE — Telephone Encounter (Signed)
The pt was notified per Lauren to use Eucerin Lotion on her hands. I informed her that dry skin in the winter months is normal and that its very important to apply lotion as frequent as possible. The patient verbalize understanding.

## 2017-10-01 ENCOUNTER — Telehealth: Payer: Self-pay | Admitting: Nurse Practitioner

## 2017-10-01 NOTE — Telephone Encounter (Signed)
Pt has sinus issues, chest congestion and cough and asked what over the counter medications she should try [phone]

## 2017-10-02 NOTE — Telephone Encounter (Signed)
Left message and also send my chart message.

## 2017-10-02 NOTE — Telephone Encounter (Signed)
-   May use Flonase 2 sprays each nostril daily for up to 4-6 weeks for stuffy nose and sinus pressure - If congestion is worse, start OTC Mucinex (or may try Mucinex-DM for congestion and cough) up to 7-10 days then stop - Drink plenty of fluids to improve congestion - You may try over the counter Nasal Saline spray (Simply Saline, Ocean Spray) as needed to reduce congestion. - Drink warm herbal tea with honey for sore throat. - Start taking Tylenol extra strength 1 to 2 tablets every 6-8 hours for aches or fever/chills for next few days as needed.  Do not take more than 3,000 mg in 24 hours from all medicines.  May take Ibuprofen as well if tolerated 200-400mg  every 8 hours as needed.  If symptoms persist longer than 10 days, she will likely need to be seen in clinic.

## 2017-10-29 ENCOUNTER — Encounter: Payer: Self-pay | Admitting: Nurse Practitioner

## 2017-10-29 ENCOUNTER — Other Ambulatory Visit: Payer: Self-pay

## 2017-10-29 ENCOUNTER — Ambulatory Visit: Payer: Medicaid Other | Admitting: Nurse Practitioner

## 2017-10-29 VITALS — BP 111/60 | HR 59 | Temp 98.4°F | Ht 63.0 in | Wt 166.0 lb

## 2017-10-29 DIAGNOSIS — F419 Anxiety disorder, unspecified: Secondary | ICD-10-CM

## 2017-10-29 DIAGNOSIS — F329 Major depressive disorder, single episode, unspecified: Secondary | ICD-10-CM

## 2017-10-29 DIAGNOSIS — F32A Depression, unspecified: Secondary | ICD-10-CM

## 2017-10-29 DIAGNOSIS — F410 Panic disorder [episodic paroxysmal anxiety] without agoraphobia: Secondary | ICD-10-CM | POA: Insufficient documentation

## 2017-10-29 DIAGNOSIS — F430 Acute stress reaction: Secondary | ICD-10-CM | POA: Diagnosis not present

## 2017-10-29 MED ORDER — HYDROXYZINE HCL 50 MG PO TABS: 50.0000 mg | ORAL_TABLET | Freq: Three times a day (TID) | ORAL | 0 refills | Status: DC | PRN

## 2017-10-29 MED ORDER — FLUOXETINE HCL 20 MG PO CAPS: 20.0000 mg | ORAL_CAPSULE | Freq: Every day | ORAL | 5 refills | Status: DC

## 2017-10-29 NOTE — Patient Instructions (Addendum)
Kimberly Mosley, Thank you for coming in to clinic today.  1. For your anxiety: - Do a body scan if you are really worried or anxious.  We practiced this during your visit. - Avoid caffeine if you can. - Increase your physical activity.  - Always remember deep breathing.  [name], LCSW - counselor Nature conservation officerLebauer [facility] [address] [place], [place] [id number] Main Line: [phone]  - START fluoxetine 20 mg once daily. - START hydroxyzine 50 mg three times daily only as needed for anxiety.  Please schedule a follow-up appointment with Wilhelmina McardleLauren Sherian Valenza, AGNP. Return in about 6 weeks (around 12/10/2017) for anxiety.  Don't hesitate to call sooner if you need us sooner.  If you have any other questions or concerns, please feel free to call the clinic or send a message through MyChart. You may also schedule an earlier appointment if necessary.  You will receive a survey after today's visit either digitally by e-mail or paper by [employer] mail. Your experiences and feedback matter to us.  Please respond so we know how we are doing as we provide care for you.   Wilhelmina McardleLauren Renlee Floor, DNP, AGNP-BC Adult Gerontology Nurse Practitioner [facility], [facility]

## 2017-10-29 NOTE — Assessment & Plan Note (Signed)
Pt had previously been having increasing anxiety and depression symptoms after her pregnancy.  Her son is now almost 24 year old.  She has had increasing anxiety and depression symptoms as evidenced by increased PHQ9 and GAD 7 scores over last 1 month.  Complicated by personal and work stress.  Plan: 1. Discussed stress management strategies including body scan. - Demonstrated in clinic with positive response. 2. START hydroxyzine 50 mg tid prn for anxiety and panic attack 3. START fluoxetine 20 mg capsule.  Take one cap once daily. 4. Followup w/ LCSW for counseling for stress management.  Referral placed. 5. Followup with me in clinic 6 weeks for med management.

## 2017-10-29 NOTE — Assessment & Plan Note (Signed)
See AP anxiety. 

## 2017-10-29 NOTE — Progress Notes (Signed)
Subjective:    Patient ID: Kimberly Mosley, female    DOB: 1995-12-18, 22 y.o.   MRN: [id number]  Kimberly Mosley is a 22 y.o. female presenting on 10/29/2017 for Anxiety (intermittent anxiety attack )   HPI Anxiety Pt reports having intermittent anxiety attacks that have been low in severity until last night.  Last night had a severely debilitating panic attack lasting 30 minutes approx. with chest pain, heart racing, shortness of breath. Last debilitating panic attack previously was with her parents' divorce at age 22. She does not have these frequently.  Pt states she was discussing with her Steffanie RainwaterFiancee last week that she felt she needed to come talk about anxiety and depression.  Today, she is seeking care because of her recent panic attack. - She is not currently on any antianxiety/antidepression meds.  She has taken Zoloft and Hydroxyzine in past, but states she feels these made her isolated.  She states she wanted to stay in her room and not leave.  Has just felt like she has "been middle man" for mom and stepdad's recent divorce over the last 1 month.  She also reports a high level of job stress with fluctuating hours (financial impact) and interpersonal stress/conflict at the job.  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  Feels this is better opportunity and "feels really good about this job."  GAD 7 : Generalized Anxiety Score 10/29/2017 05/01/2017  Nervous, Anxious, on Edge 3 0  Control/stop worrying 2 0  Worry too much - different things 2 1  Trouble relaxing 2 0  Restless 0 0  Easily annoyed or irritable 3 0  Afraid - awful might happen 3 1  Total GAD 7 Score 15 2  Anxiety Difficulty Very difficult Somewhat difficult    Depression screen [facility] 2/9 10/29/2017 05/01/2017  Decreased Interest 2 2  Down, Depressed, Hopeless 3 1  PHQ - 2 Score 5 3  Altered sleeping 3 1  Tired, decreased  energy 3 2  Change in appetite 1 -  Feeling bad or failure about yourself  2 1  Trouble concentrating 0 2  Moving slowly or fidgety/restless 1 1  Suicidal thoughts 0 1  PHQ-9 Score 15 11  Difficult doing work/chores Very difficult -    Social History   Tobacco Use  . Smoking status: Current Every Day Smoker    Packs/day: 0.10    Years: 5.00    Pack years: 0.50    Types: Cigarettes  . Smokeless tobacco: Former Engineer, waterUser  Substance Use Topics  . Alcohol use: No  . Drug use: No    Review of Systems Per HPI unless specifically indicated above     Objective:    BP 111/60 (BP Location: Right Arm, Patient Position: Sitting)   Pulse (!) 59   Temp 98.4 F (36.9 C) (Oral)   Ht 5\' 3"  (1.6 m)   Wt 166 lb (75.3 kg)   BMI 29.41 kg/m   Wt Readings from Last 3 Encounters:  10/29/17 166 lb (75.3 kg)  08/20/17 165 lb 9.6 oz (75.1 kg)  07/30/17 161 lb 12.8 oz (73.4 kg)    Physical Exam  General - overweight, well-appearing, NAD HEENT - Normocephalic, atraumatic Neck - supple, non-tender, no LAD, no thyromegaly Heart - RRR, no murmurs heard Lungs - Clear throughout all lobes, no wheezing, crackles, or rhonchi. Normal work of breathing. Extremeties -  non-tender, no edema, cap refill < 2 seconds, peripheral pulses intact +2 bilaterally Skin - warm, dry Neuro - awake, alert, oriented x3, normal gait, mild resting tremor Psych - Anxious mood and affect, normal behavior    Results for orders placed or performed in visit on 08/20/17  POCT Urinalysis Dipstick  Result Value Ref Range   Color, UA yellow    Clarity, UA clear    Glucose, UA neg    Bilirubin, UA neg    Ketones, UA neg    Spec Grav, UA 1.015 1.010 - 1.025   Blood, UA neg    pH, UA 6.0 5.0 - 8.0   Protein, UA neg    Urobilinogen, UA 0.2 0.2 or 1.0 E.U./dL   Nitrite, UA neg    Leukocytes, UA Trace (A) Negative      Assessment & Plan:   Problem List Items Addressed This Visit      Other   Anxiety and depression -  Primary    Pt had previously been having increasing anxiety and depression symptoms after her pregnancy.  Her son is now almost 62 year old.  She has had increasing anxiety and depression symptoms as evidenced by increased PHQ9 and GAD 7 scores over last 1 month.  Complicated by personal and work stress.  Plan: 1. Discussed stress management strategies including body scan. - Demonstrated in clinic with positive response. 2. START hydroxyzine 50 mg tid prn for anxiety and panic attack 3. START fluoxetine 20 mg capsule.  Take one cap once daily. 4. Followup w/ LCSW for counseling for stress management.  Referral placed. 5. Followup with me in clinic 6 weeks for med management.      Relevant Medications   hydrOXYzine (ATARAX/VISTARIL) 50 MG tablet   FLUoxetine (PROZAC) 20 MG capsule   Other Relevant Orders   Ambulatory referral to Social Work   Panic attack as reaction to stress    See AP anxiety.      Relevant Medications   hydrOXYzine (ATARAX/VISTARIL) 50 MG tablet   FLUoxetine (PROZAC) 20 MG capsule   Other Relevant Orders   Ambulatory referral to Social Work      Meds ordered this encounter  Medications  . hydrOXYzine (ATARAX/VISTARIL) 50 MG tablet    Sig: Take 1 tablet (50 mg total) by mouth 3 (three) times daily as needed for anxiety.    Dispense:  60 tablet    Refill:  0    Order Specific Question:   Supervising Provider    Answer:   Smitty Cords [[id number]]  . FLUoxetine (PROZAC) 20 MG capsule    Sig: Take 1 capsule (20 mg total) by mouth daily.    Dispense:  30 capsule    Refill:  5    Order Specific Question:   Supervising Provider    Answer:   Smitty Cords [[id number]]    Follow up plan: Return in about 6 weeks (around 12/10/2017) for anxiety.  A total of 25 minutes was spent face-to-face with this patient. Greater than 50% of this time was spent in counseling and coordination of care with the patient.    [name], DNP, AGPCNP-BC Adult  Gerontology Primary Care Nurse Practitioner [facility] 10/29/2017, 5:17 PM

## 2017-11-29 ENCOUNTER — Ambulatory Visit: Payer: Self-pay | Admitting: Nurse Practitioner

## 2017-12-30 ENCOUNTER — Telehealth: Payer: Self-pay | Admitting: Nurse Practitioner

## 2017-12-30 NOTE — Telephone Encounter (Signed)
Pt asked for a refill on fluoxetine.  She uses [facility] [address]  Her call back numberi s [phone]

## 2018-04-17 ENCOUNTER — Ambulatory Visit: Payer: Medicaid Other | Admitting: Nurse Practitioner

## 2018-04-17 ENCOUNTER — Encounter: Payer: Self-pay | Admitting: Nurse Practitioner

## 2018-04-17 ENCOUNTER — Other Ambulatory Visit: Payer: Self-pay

## 2018-04-17 VITALS — BP 116/57 | HR 74 | Temp 98.2°F | Ht 63.0 in | Wt 182.2 lb

## 2018-04-17 DIAGNOSIS — W57XXXA Bitten or stung by nonvenomous insect and other nonvenomous arthropods, initial encounter: Secondary | ICD-10-CM | POA: Diagnosis not present

## 2018-04-17 DIAGNOSIS — S20161A Insect bite (nonvenomous) of breast, right breast, initial encounter: Secondary | ICD-10-CM | POA: Diagnosis not present

## 2018-04-17 MED ORDER — TRIAMCINOLONE ACETONIDE 0.025 % EX OINT: 1.0000 "application " | TOPICAL_OINTMENT | Freq: Two times a day (BID) | CUTANEOUS | 0 refills | Status: AC

## 2018-04-17 NOTE — Patient Instructions (Addendum)
Kimberly EssexHeidi M Mosley,   Thank you for coming in to clinic today.  1. You have an insect bite on the lower right breast. -START using triamcinolone ointment twice daily for 10 days to your area of skin irritation.  Please schedule a follow-up appointment with [name], AGNP. Return if symptoms worsen or fail to improve.  If you have any other questions or concerns, please feel free to call the clinic or send a message through MyChart. You may also schedule an earlier appointment if necessary.  You will receive a survey after today's visit either digitally by e-mail or paper by [employer] mail. Your experiences and feedback matter to us.  Please respond so we know how we are doing as we provide care for you.   Wilhelmina McardleLauren Rashaud Ybarbo, DNP, AGNP-BC Adult Gerontology Nurse Practitioner [facility], [facility]

## 2018-04-17 NOTE — Progress Notes (Signed)
Subjective:    Patient ID: MELENY TREGONING, female    DOB: 19-Jan-1996, 22 y.o.   MRN: [id number]  ANACRISTINA STEFFEK is a 22 y.o. female presenting on 04/17/2018 for Breast Pain (Rt breast redness, pain x 4 days )   HPI R Breast pain Monday pain started and is described as irritation on skin.  Had noted redness on right lateral breast at same time she started experiencing skin irritation of right lower lateral breast.  Irritation and pain are noted to be superficial.  Patient has no lumps, no discharge, and no apparent break in the skin.  She is accompanied by young child today and reports that she has not been recently breast-feeding.  Social History   Tobacco Use  . Smoking status: Current Every Day Smoker    Packs/day: 0.10    Years: 5.00    Pack years: 0.50    Types: Cigarettes  . Smokeless tobacco: Former Engineer, water Use Topics  . Alcohol use: No  . Drug use: No    Review of Systems Per HPI unless specifically indicated above     Objective:    BP (!) 116/57 (BP Location: Right Arm, Patient Position: Sitting, Cuff Size: Normal)   Pulse 74   Temp 98.2 F (36.8 C) (Oral)   Ht 5\' 3"  (1.6 m)   Wt 182 lb 3.2 oz (82.6 kg)   BMI 32.28 kg/m   Wt Readings from Last 3 Encounters:  04/17/18 182 lb 3.2 oz (82.6 kg)  10/29/17 166 lb (75.3 kg)  08/20/17 165 lb 9.6 oz (75.1 kg)    Physical Exam  Constitutional: She is oriented to person, place, and time. She appears well-developed and well-nourished. No distress.  HENT:  Head: Normocephalic and atraumatic.  Pulmonary/Chest: Right breast exhibits skin change (Erythema of right breast at approximately 7:00 with central induration less than 1 mm at level of dermis.  Lesion has no drainage, no periwound erythema.  Consistent with insect bite). Right breast exhibits no inverted nipple, no mass, no nipple discharge and no tenderness. Left breast exhibits no inverted nipple, no mass, no nipple discharge, no skin change and no  tenderness. Breasts are symmetrical.  Neurological: She is alert and oriented to person, place, and time.  Skin: Skin is warm and dry. Capillary refill takes less than 2 seconds. Rash noted.  Psychiatric: She has a normal mood and affect. Her behavior is normal. Judgment and thought content normal.  Vitals reviewed.  Results for orders placed or performed in visit on 08/20/17  POCT Urinalysis Dipstick  Result Value Ref Range   Color, UA yellow    Clarity, UA clear    Glucose, UA neg    Bilirubin, UA neg    Ketones, UA neg    Spec Grav, UA 1.015 1.010 - 1.025   Blood, UA neg    pH, UA 6.0 5.0 - 8.0   Protein, UA neg    Urobilinogen, UA 0.2 0.2 or 1.0 E.U./dL   Nitrite, UA neg    Leukocytes, UA Trace (A) Negative      Assessment & Plan:   Problem List Items Addressed This Visit    None    Visit Diagnoses    Insect bite (nonvenomous) of breast, right breast, initial encounter    -  Primary   Relevant Medications   triamcinolone (KENALOG) 0.025 % ointment    Pain likely self-limited and related to dermatitis following insect bite.  Acutely irritated with skin irritation/inflammation.  Plan:  1. Treat with OTC pain meds (acetaminophen and ibuprofen).  Discussed alternate dosing and max dosing. 2. Apply heat and/or ice to affected area. 3. Apply triamcinolone 0.25% twice daily x 10 days. 4. Follow up prn    Meds ordered this encounter  Medications  . triamcinolone (KENALOG) 0.025 % ointment    Sig: Apply 1 application topically 2 (two) times daily for 10 days.    Dispense:  30 g    Refill:  0    Order Specific Question:   Supervising Provider    Answer:   Smitty CordsKARAMALEGOS, ALEXANDER J [[id number]]   Follow up plan: Return if symptoms worsen or fail to improve.  Wilhelmina McardleLauren Malessa Zartman, DNP, AGPCNP-BC Adult Gerontology Primary Care Nurse Practitioner [facility] 04/17/2018, 2:28 PM

## 2018-06-05 ENCOUNTER — Ambulatory Visit: Payer: Medicaid Other | Admitting: Nurse Practitioner

## 2018-06-05 ENCOUNTER — Encounter: Payer: Self-pay | Admitting: Nurse Practitioner

## 2018-06-05 ENCOUNTER — Ambulatory Visit: Payer: Self-pay | Admitting: Nurse Practitioner

## 2018-06-05 ENCOUNTER — Other Ambulatory Visit: Payer: Self-pay

## 2018-06-05 VITALS — BP 103/59 | HR 78 | Temp 98.3°F | Ht 63.0 in | Wt 183.4 lb

## 2018-06-05 DIAGNOSIS — M5442 Lumbago with sciatica, left side: Secondary | ICD-10-CM | POA: Diagnosis not present

## 2018-06-05 DIAGNOSIS — E6609 Other obesity due to excess calories: Secondary | ICD-10-CM | POA: Insufficient documentation

## 2018-06-05 DIAGNOSIS — Z6832 Body mass index (BMI) 32.0-32.9, adult: Secondary | ICD-10-CM | POA: Diagnosis not present

## 2018-06-05 MED ORDER — LIDOCAINE 5 % EX OINT: 1.0000 "application " | TOPICAL_OINTMENT | CUTANEOUS | 0 refills | Status: DC | PRN

## 2018-06-05 MED ORDER — NAPROXEN 500 MG PO TABS: 500.0000 mg | ORAL_TABLET | Freq: Two times a day (BID) | ORAL | 0 refills | Status: AC

## 2018-06-05 NOTE — Progress Notes (Signed)
Subjective:    Patient ID: Kimberly Mosley, female    DOB: [birth year], 22 y.o.   MRN: [id number]  Kimberly Mosley is a [age] y.o. female presenting on 06/05/2018 for Back Pain (intermittent lower left side back pain that radiates down the left leg. x 2 weeks. ) and Weight Gain   HPI Left Low Back Pain Middle of low back and has sciatica shooting pain down left leg when standing/walking.  No sciatica with sitting.  Pain occurs and is affecting balance some.  Some warm/heat sensation is occurring with the pain.  Patient has not taken any OTC medication or tried any alternative therapies for pain in last 2 weeks.   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  Walked to her old job for her job.  Patient admits she is more sedentary and eats large amounts of food.  Often, she will go all day without eating and eat with a binge session at night after work.  She requests information today about healthy diet.  Social History   Tobacco Use  . Smoking status: Current Every Day Smoker    Packs/day: 0.10    Years: 5.00    Pack years: 0.50    Types: Cigarettes  . Smokeless tobacco: Former Engineer, waterUser  Substance Use Topics  . Alcohol use: No  . Drug use: No    Review of Systems Per HPI unless specifically indicated above     Objective:    BP (!) 103/59 (BP Location: Right Arm, Patient Position: Sitting, Cuff Size: Normal)   Pulse 78   Temp 98.3 F (36.8 C) (Oral)   Ht 5\' 3"  (1.6 m)   Wt 183 lb 6.4 oz (83.2 kg)   BMI 32.49 kg/m   Wt Readings from Last 3 Encounters:  06/05/18 183 lb 6.4 oz (83.2 kg)  04/17/18 182 lb 3.2 oz (82.6 kg)  10/29/17 166 lb (75.3 kg)    Physical Exam  Constitutional: She is oriented to person, place, and time. She appears well-developed. No distress.  Obese with central adiposity  HENT:  Head: Normocephalic and atraumatic.  Cardiovascular: Normal rate, regular rhythm, S1 normal, S2 normal, normal heart sounds and  intact distal pulses.  Pulmonary/Chest: Effort normal and breath sounds normal. No respiratory distress.  Musculoskeletal:  Low Back Inspection: Normal appearance, Large body habitus, no spinal deformity, symmetrical. Palpation: No tenderness over spinous processes. Bilateral lumbar paraspinal muscles tender and with hypertonicity/spasm. ROM: Full active ROM forward flex / back extension, rotation L/R without discomfort Special Testing: Seated SLR negative for reproduced localized pain and radicular pain bilaterally. Standing facet load test negative.  Strength: Bilateral hip flex/ext 5/5, knee flex/ext 5/5, ankle dorsiflex/plantarflex 5/5 Neurovascular: intact distal sensation to light touch   Neurological: She is alert and oriented to person, place, and time.  Skin: Skin is warm and dry. Capillary refill takes less than 2 seconds.  Psychiatric: She has a normal mood and affect. Her behavior is normal. Judgment and thought content normal.  Vitals reviewed.     Results for orders placed or performed in visit on 08/20/17  POCT Urinalysis Dipstick  Result Value Ref Range   Color, UA yellow    Clarity, UA clear    Glucose, UA neg    Bilirubin, UA neg    Ketones, UA neg    Spec Grav, UA 1.015 1.010 - 1.025   Blood, UA neg    pH, UA 6.0 5.0 - 8.0  Protein, UA neg    Urobilinogen, UA 0.2 0.2 or 1.0 E.U./dL   Nitrite, UA neg    Leukocytes, UA Trace (A) Negative      Assessment & Plan:   Problem List Items Addressed This Visit      Other   Class 1 obesity due to excess calories without serious comorbidity with body mass index (BMI) of 32.0 to 32.9 in adult    Other Visit Diagnoses    Acute midline low back pain with left-sided sciatica    -  Primary   Relevant Medications   lidocaine (XYLOCAINE) 5 % ointment   naproxen (NAPROSYN) 500 MG tablet    # Obesity Weight gain secondary to calorie over-nutrition.  Patient with significant dietary indiscretions as well as binge eating  habits.  Recommended weight loss of 15 pounds for initial goal in 3 months.  Plan: 1.  Provided patient my [employer]chplate.gov information.  Encouraged adequate numbers of serving sizes of healthy foods.  Provided information about serving size. 2.  Encouraged patient to eat regular meals throughout the day.  Avoid large meals and binge eating sessions. 3.  Follow-up with annual physical in 2 to 3 months   # Acute low back pain likely self-limited.  Muscle strain possible complicated by overuse, body habitus, and poor posture.  Plan:  1. Treat with naproxen 500 mg tablet by mouth twice daily x14 days.  May continue OTC acetaminophen.  Discussed alternate dosing and max dosing. 2. Apply heat and/or ice to affected area. 3. May also apply a muscle rub with lidocaine or lidocaine patch after heat or ice. 4. Follow up 2-4 weeks as needed.    Meds ordered this encounter  Medications  . lidocaine (XYLOCAINE) 5 % ointment    Sig: Apply 1 application topically as needed.    Dispense:  35.44 g    Refill:  0    Order Specific Question:   Supervising Provider    Answer:   Smitty CordsKARAMALEGOS, ALEXANDER J [[id number]]  . naproxen (NAPROSYN) 500 MG tablet    Sig: Take 1 tablet (500 mg total) by mouth 2 (two) times daily with a meal for 14 days.    Dispense:  28 tablet    Refill:  0    Order Specific Question:   Supervising Provider    Answer:   Smitty CordsKARAMALEGOS, ALEXANDER J [[id number]]    Follow up plan: Return in 2-4 weeks if symptoms worsen or fail to improve. A total of 30 minutes was spent face-to-face with this patient. Greater than 50% (20/30 mins) of this time was spent in counseling and coordination of care with the patient regarding lifestyle, healthy diet, moderation of food intake.    Wilhelmina McardleLauren Cielle Aguila, DNP, AGPCNP-BC Adult Gerontology Primary Care Nurse Practitioner [facility] 06/05/2018, 3:38 PM

## 2018-06-05 NOTE — Patient Instructions (Addendum)
Kimberly EssexHeidi M Scotto,   Thank you for coming in to clinic today.  1. You have a low back muscle strain.  You also have sciatica or shooting nerve pain.   - Start taking naproxen 500 mg twice daily for 14 days.  You may also take Tylenol extra strength 1 to 2 tablets every 6-8 hours for aches or fever/chills for next few days as needed.  Do not take more than 3,000 mg in 24 hours from all medicines.  May take Ibuprofen as well if tolerated 200-400mg  every 8 hours as needed. May alternate tylenol and ibuprofen in same day. - Use heat and ice.  Apply this for 15 minutes at a time 6-8 times per day.   - Muscle rub with lidocaine, lidocaine patch, Biofreeze, or tiger balm for topical pain relief.  Avoid using this with heat and ice to avoid burns.  2. For weight loss:  - Eat at least 2 meals and 1 snack per day or 3 small meals and 1 snack per day.   - Goal is 1400-1600 calories per day for weight loss.   - Watch serving sizes and how much food you eat for your evening meal.  Please schedule a follow-up appointment with Wilhelmina McardleLauren Remmington Teters, AGNP.  Return in 2-4 weeks if symptoms worsen or fail to improve.  If you have any other questions or concerns, please feel free to call the clinic or send a message through MyChart. You may also schedule an earlier appointment if necessary.  You will receive a survey after today's visit either digitally by e-mail or paper by [employer] mail. Your experiences and feedback matter to us.  Please respond so we know how we are doing as we provide care for you.   Wilhelmina McardleLauren Tywanna Seifer, DNP, AGNP-BC Adult Gerontology Nurse Practitioner [facility], [facility]   Serving Sizes A serving size is a measured amount of food or drink, such as one slice of bread, that has an associated nutrient content. Knowing the serving size of a food or drink can help you determine how much of that food you should consume. What is the size of one serving? The size of one healthy serving depends  on the food or drink. To determine a serving size, read the food label. If the food or drink does not have a food label, try to find serving size information online. Or, use the following to estimate the size of one adult serving: Grain 1 slice bread.  bagel.  cup pasta. Vegetable  cup cooked or canned vegetables. 1 cup raw, leafy greens. Fruit  cup canned fruit. 1 medium fruit.  cup dried fruit. Meat and Other Protein Sources 1 oz meat, poultry, or fish.  cup cooked beans. 1 egg.  cup nuts or seeds. 1 Tbsp nut butter.  cup tofu or tempeh. 2 Tbsp hummus. Dairy An individual container of yogurt (6-8 oz). 1 piece of cheese the size of your thumb (1 oz). 1 cup (8 oz) milk or milk alternative. Fat A piece the size of one dice. 1 tsp soft margarine. 1 Tbsp mayonnaise. 1 tsp vegetable oil. 1 Tbsp regular salad dressing. 2 Tbsp low-fat salad dressing. How many servings should I eat from each food group each day? The following are the suggested number of servings to try and have every day from each food group. You can also look at your eating throughout the week and aim for meeting these requirements on most days for overall healthy eating. Grain 6-8 servings.  Try to have half of your grains from whole grains, such as whole wheat bread, corn tortillas, oatmeal, brown rice, whole wheat pasta, and bulgur. Vegetable At least 2-3 servings. Fruit 2 servings. Meat and Other Protein Foods 5-6 servings. Aim to have lean proteins, such as chicken, [place], fish, beans, or tofu. Dairy 3 servings. Choose low-fat or nonfat if you are trying to control your weight. Fat 2-3 servings. Is a serving the same thing as a portion? No. A portion is the actual amount you eat, which may be more than one serving. Knowing the specific serving size of a food and the nutritional information that goes with it can help you make a healthy decision on what size portion to eat. What are some tips to help me learn  healthy serving sizes?  Check food labels for serving sizes. Many foods that come as a single portion actually contain multiple servings.  Determine the serving size of foods you commonly eat and figure out how large a portion you usually eat.  Measure the number of servings that can be held by the bowls, glasses, cups, and plates you typically use. For example, pour your breakfast cereal into your regular bowl and then pour it into a measuring cup.  For 1-2 days, measure the serving sizes of all the foods you eat.  Practice estimating serving sizes and determining how big your portions should be. This information is not intended to replace advice given to you by your health care provider. Make sure you discuss any questions you have with your health care provider. Document Released: 07/07/2003 Document Revised: 06/02/2016 Document Reviewed: 01/05/2014 Elsevier Interactive Patient Education  2018 [employer].  MyPlate from [employer] The general, healthful diet is based on the 2010 Dietary Guidelines for Americans. The amount of food you need to eat from each food group depends on your age, sex, and level of physical activity and can be individualized by a dietitian. Go to https://www.bernard.org/ for more information. What do I need to know about the MyPlate plan?  Enjoy your food, but eat less.  Avoid oversized portions. ?  of your plate should include fruits and vegetables. ?  of your plate should be grains. ?  of your plate should be protein. Grains  Make at least half of your grains whole grains.  For a 2,000 calorie daily food plan, eat 6 oz every day.  1 oz is about 1 slice bread, 1 cup cereal, or  cup cooked rice, cereal, or pasta. Vegetables  Make half your plate fruits and vegetables.  For a 2,000 calorie daily food plan, eat 2 cups every day.  1 cup is about 1 cup raw or cooked vegetables or vegetable juice or 2 cups raw leafy greens. Fruits  Make half your plate fruits  and vegetables.  For a 2,000 calorie daily food plan, eat 2 cups every day.  1 cup is about 1 cup fruit or 100% fruit juice or  cup dried fruit. Protein  For a 2,000 calorie daily food plan, eat 5 oz every day.  1 oz is about 1 oz meat, poultry, or fish,  cup cooked beans, 1 egg, 1 Tbsp peanut butter, or  oz nuts or seeds. Dairy  Switch to fat-free or low-fat (1%) milk.  For a 2,000 calorie daily food plan, eat 3 cups every day.  1 cup is about 1 cup milk or yogurt or soy milk (soy beverage), 1 oz natural cheese, or 2 oz processed cheese. Fats, Oils,  and Empty Calories  Only small amounts of oils are recommended.  Empty calories are calories from solid fats or added sugars.  Compare sodium in foods like soup, bread, and frozen meals. Choose the foods with lower numbers.  Drink water instead of sugary drinks. What foods can I eat? Grains Whole grains such as whole wheat, quinoa, millet, and bulgur. Bread, rolls, and pasta made from whole grains. Brown or wild rice. Hot or cold cereals made from whole grains and without added sugar. Vegetables All fresh vegetables, especially fresh red, dark green, or orange vegetables. Peas and beans. Low-sodium frozen or canned vegetables prepared without added salt. Low-sodium vegetable juices. Fruits All fresh, frozen, and dried fruits. Canned fruit packed in water or fruit juice without added sugar. Fruit juices without added sugar. Meats and Other Protein Sources Boiled, baked, or grilled lean meat trimmed of fat. Skinless poultry. Fresh seafood and shellfish. Canned seafood packed in water. Unsalted nuts and unsalted nut butters. Tofu. Dried beans and pea. Eggs. Dairy Low-fat or fat-free milk, yogurt, and cheeses. Sweets and Desserts Frozen desserts made from low-fat milk. Fats and Oils Olive, peanut, and canola oils and margarine. Salad dressing and mayonnaise made from these oils. Other Soups and casseroles made from allowed  ingredients and without added fat or salt. The items listed above may not be a complete list of recommended foods or beverages. Contact your dietitian for more options. What foods are not recommended? Grains Sweetened, low-fiber cereals. Packaged baked goods. Snack crackers and chips. Cheese crackers, butter crackers, and biscuits. Frozen waffles, sweet breads, doughnuts, pastries, packaged baking mixes, pancakes, cakes, and cookies. Vegetables Regular canned or frozen vegetables or vegetables prepared with salt. Canned tomatoes. Canned tomato sauce. Fried vegetables. Vegetables in cream sauce or cheese sauce. Fruits Fruits packed in syrup or made with added sugar. Meats and Other Protein Sources Marbled or fatty meats such as ribs. Poultry with skin. Fried meats, poultry, eggs, or fish. Sausages, hot dogs, and deli meats such as pastrami, bologna, or salami. Dairy Whole milk, cream, cheeses made from whole milk, sour cream. Ice cream or yogurt made from whole milk or with added sugar. Beverages For adults, no more than one alcoholic drink per day. Regular soft drinks or other sugary beverages. Juice drinks. Sweets and Desserts Sugary or fatty desserts, candy, and other sweets. Fats and Oils Solid shortening or partially hydrogenated oils. Solid margarine. Margarine that contains trans fats. Butter. The items listed above may not be a complete list of foods and beverages to avoid. Contact your dietitian for more information. This information is not intended to replace advice given to you by your health care provider. Make sure you discuss any questions you have with your health care provider. Document Released: 10/28/2007 Document Revised: 03/15/2016 Document Reviewed: 09/16/2013 Elsevier Interactive Patient Education  [employer]2018 Elsevier Inc.

## 2018-06-09 ENCOUNTER — Telehealth: Payer: Self-pay

## 2018-06-09 NOTE — Telephone Encounter (Signed)
Work note cannot and will not be provided.

## 2018-06-09 NOTE — Telephone Encounter (Signed)
The pt called requesting a work note for yesterday. She states she wasn't able to get her pain medication until Friday. She states she went to work on Friday and Saturday, but stayed home to rest her back on Sunday. The pt states her boyfriend was off and she was able to stay home and rest her back. Please advise

## 2018-06-10 NOTE — Telephone Encounter (Signed)
Attempted to contact the patient, no answer. LMOM to return my call. ?

## 2018-06-11 NOTE — Telephone Encounter (Signed)
The pt was notified. No questions or concerns. 

## 2018-08-26 ENCOUNTER — Encounter: Payer: Self-pay | Admitting: Nurse Practitioner

## 2018-10-13 ENCOUNTER — Other Ambulatory Visit: Payer: Self-pay

## 2018-10-13 ENCOUNTER — Encounter: Payer: Self-pay | Admitting: *Deleted

## 2018-10-13 ENCOUNTER — Emergency Department
Admission: EM | Admit: 2018-10-13 | Discharge: 2018-10-13 | Disposition: A | Discharge status: Home / Self Care | Payer: Medicaid Other | Attending: Student in an Organized Health Care Education/Training Program | Admitting: Student in an Organized Health Care Education/Training Program

## 2018-10-13 DIAGNOSIS — F419 Anxiety disorder, unspecified: Secondary | ICD-10-CM | POA: Insufficient documentation

## 2018-10-13 DIAGNOSIS — F17210 Nicotine dependence, cigarettes, uncomplicated: Secondary | ICD-10-CM | POA: Insufficient documentation

## 2018-10-13 DIAGNOSIS — R42 Dizziness and giddiness: Secondary | ICD-10-CM | POA: Insufficient documentation

## 2018-10-13 DIAGNOSIS — Z79899 Other long term (current) drug therapy: Secondary | ICD-10-CM | POA: Insufficient documentation

## 2018-10-13 DIAGNOSIS — F329 Major depressive disorder, single episode, unspecified: Secondary | ICD-10-CM | POA: Insufficient documentation

## 2018-10-13 LAB — BASIC METABOLIC PANEL
Anion gap: 7 (ref 5–15)
BUN: 9 mg/dL (ref 6–20)
CO2: 25 mmol/L (ref 22–32)
Calcium: 9.3 mg/dL (ref 8.9–10.3)
Chloride: 106 mmol/L (ref 98–111)
Creatinine, Ser: 0.83 mg/dL (ref 0.44–1.00)
GFR calc Af Amer: >60 mL/min (ref >60)
GFR calc non Af Amer: >60 mL/min (ref >60)
Glucose, Bld: 95 mg/dL (ref 70–99)
Potassium: 3.6 mmol/L (ref 3.5–5.1)
SODIUM: 138 mmol/L (ref 135–145)

## 2018-10-13 LAB — CBC
HCT: 43.3 % (ref 36.0–46.0)
Hemoglobin: 14.2 g/dL (ref 12.0–15.0)
MCH: 28.9 pg (ref 26.0–34.0)
MCHC: 32.8 g/dL (ref 30.0–36.0)
MCV: 88.2 fL (ref 80.0–100.0)
PLATELETS: 286 10*3/uL (ref 150–400)
RBC: 4.91 MIL/uL (ref 3.87–5.11)
RDW: 12.3 % (ref 11.5–15.5)
WBC: 10.8 10*3/uL — ABNORMAL HIGH (ref 4.0–10.5)
nRBC: 0 % (ref 0.0–0.2)

## 2018-10-13 MED ORDER — MECLIZINE HCL 25 MG PO TABS: 25.0000 mg | ORAL_TABLET | Freq: Three times a day (TID) | ORAL | 0 refills | Status: DC | PRN

## 2018-10-13 NOTE — ED Triage Notes (Signed)
Pt tp Ed reporting dizziness for the past week that is worse in the morning. PT reports a hx of anemia and verbalized concerns that she is anemic at this time. NO active bleeding reported. Pt also reporting increased fatigue over the past week as well. No fevers.

## 2018-10-13 NOTE — ED Provider Notes (Signed)
[facility] Emergency Department Provider Note    First MD Initiated Contact with Patient 10/13/18 2301     (approximate)  I have reviewed the triage vital signs and the nursing notes.   HISTORY  Chief Complaint Dizziness and Fatigue    HPI Kimberly Mosley is a 22 y.o. female with below listed pmh p/w cc of dizziness and spinning sensation particularly when getting out of bed in the morning for the past week.  She denies any headaches or blurry vision.  Has never had symptoms like this before.  Currently is asymptomatic.  No nausea or vomiting.  Denies any chest pain or sob.  No trauma.   Past Medical History:  Diagnosis Date  . Anemia   . Anxiety    situational  . H/O Bell's palsy   . Medical history non-contributory    Family History  Problem Relation Age of Onset  . Depression Father   . Bipolar disorder Father   . Healthy Mother   . Healthy Sister   . Healthy Brother   . Cancer Maternal Uncle        pancreatic cancer  . Heart disease Maternal Grandmother   . Stroke Maternal Grandmother   . Healthy Maternal Grandfather   . Breast cancer Neg Hx   . Colon cancer Neg Hx   . Ovarian cancer Neg Hx    Past Surgical History:  Procedure Laterality Date  . NO PAST SURGERIES     Patient Active Problem List   Diagnosis Date Noted  . Class 1 obesity due to excess calories without serious comorbidity with body mass index (BMI) of 32.0 to 32.9 in adult 06/05/2018  . Panic attack as reaction to stress 10/29/2017  . Anxiety and depression 05/01/2017  . Vaginal bleeding in pregnancy 09/13/2015      Prior to Admission medications   Medication Sig Start Date End Date Taking? Authorizing Provider  etonogestrel (NEXPLANON) 68 MG IMPL implant 1 each by Subdermal route once.    [provider]  FLUoxetine (PROZAC) 20 MG capsule Take 1 capsule (20 mg total) by mouth daily. Patient not taking: Reported on 06/05/2018 10/29/17   Galen ManilaKennedy, Lauren  Renee, NP  hydrOXYzine (ATARAX/VISTARIL) 50 MG tablet Take 1 tablet (50 mg total) by mouth 3 (three) times daily as needed for anxiety. Patient not taking: Reported on 04/17/2018 10/29/17   Galen ManilaKennedy, Lauren Renee, NP  lidocaine (XYLOCAINE) 5 % ointment Apply 1 application topically as needed. 06/05/18   Galen ManilaKennedy, Lauren Renee, NP  meclizine (ANTIVERT) 25 MG tablet Take 1 tablet (25 mg total) by mouth 3 (three) times daily as needed for dizziness. 10/13/18   Willy Eddyobinson, Mckade Gurka, MD    Allergies Patient has no known allergies.    Social History Social History   Tobacco Use  . Smoking status: Current Every Day Smoker    Packs/day: 0.10    Years: 5.00    Pack years: 0.50    Types: Cigarettes  . Smokeless tobacco: Former Engineer, waterUser  Substance Use Topics  . Alcohol use: No  . Drug use: No    Review of Systems Patient denies headaches, rhinorrhea, blurry vision, numbness, shortness of breath, chest pain, edema, cough, abdominal pain, nausea, vomiting, diarrhea, dysuria, fevers, rashes or hallucinations unless otherwise stated above in HPI. ____________________________________________   PHYSICAL EXAM:  VITAL SIGNS: Vitals:   10/13/18 2230 10/13/18 2300  BP: 118/74 120/71  Pulse: 90 89  Resp: 19 20  Temp:    SpO2: 100%  98%    Constitutional: Alert and oriented.  Eyes: Conjunctivae are normal.  Head: Atraumatic. Nose: No congestion/rhinnorhea. Mouth/Throat: Mucous membranes are moist.   Neck: No stridor. Painless ROM.  Cardiovascular: Normal rate, regular rhythm. Grossly normal heart sounds.  Good peripheral circulation. Respiratory: Normal respiratory effort.  No retractions. Lungs CTAB. Gastrointestinal: Soft and nontender. No distention. No abdominal bruits. No CVA tenderness. Genitourinary:  Musculoskeletal: No lower extremity tenderness nor edema.  No joint effusions. Neurologic:  CN- intact.  No facial droop, Normal FNF.  Normal heel to shin.  Sensation intact bilaterally. Normal  speech and language. No gross focal neurologic deficits are appreciated. No gait instability.  Skin:  Skin is warm, dry and intact. No rash noted. Psychiatric: Mood and affect are normal. Speech and behavior are normal.  ____________________________________________   LABS (all labs ordered are listed, but only abnormal results are displayed)  Results for orders placed or performed during the hospital encounter of 10/13/18 (from the past 24 hour(s))  Basic metabolic panel     Status: None   Collection Time: 10/13/18 10:15 PM  Result Value Ref Range   Sodium 138 135 - 145 mmol/L   Potassium 3.6 3.5 - 5.1 mmol/L   Chloride 106 98 - 111 mmol/L   CO2 25 22 - 32 mmol/L   Glucose, Bld 95 70 - 99 mg/dL   BUN 9 6 - 20 mg/dL   Creatinine, Ser 1.610.83 0.44 - 1.00 mg/dL   Calcium 9.3 8.9 - 09.610.3 mg/dL   GFR calc non Af Amer >60 >60 mL/min   GFR calc Af Amer >60 >60 mL/min   Anion gap 7 5 - 15  CBC     Status: Abnormal   Collection Time: 10/13/18 10:15 PM  Result Value Ref Range   WBC 10.8 (H) 4.0 - 10.5 K/uL   RBC 4.91 3.87 - 5.11 MIL/uL   Hemoglobin 14.2 12.0 - 15.0 g/dL   HCT 04.543.3 40.936.0 - 81.146.0 %   MCV 88.2 80.0 - 100.0 fL   MCH 28.9 26.0 - 34.0 pg   MCHC 32.8 30.0 - 36.0 g/dL   RDW 91.412.3 78.211.5 - 95.615.5 %   Platelets 286 150 - 400 K/uL   nRBC 0.0 0.0 - 0.2 %   ____________________________________________  EKG My review and personal interpretation at Time: 22:07   Indication: dizziness  Rate: 90  Rhythm: sinus Axis: normal Other: normal intervals, no stemi ____________________________________________  RADIOLOGY   ____________________________________________   PROCEDURES  Procedure(s) performed:  Procedures    Critical Care performed: no ____________________________________________   INITIAL IMPRESSION / ASSESSMENT AND PLAN / ED COURSE  Pertinent labs & imaging results that were available during my care of the patient were reviewed by me and considered in my medical  decision making (see chart for details).   DDX: dehydration, dizziness, anemia, vertigo, cvt  Laportia Fonnie BirkenheadM Gorr is a 22 y.o. who presents to the ED with sx as described above.  Patient is AFVSS in ED. Exam as above. Given current presentation have considered the above differential.  Well appearing.  No focal neuro deficits.  Blood work is reassuring.  She is currently asymptomatic and symptmos are sporadic suggesting more peripheral symptomatology.  Discussed reasons for which she should return to the ER.  Will give rx for meclizine.        As part of my medical decision making, I reviewed the following data within the electronic MEDICAL RECORD NUMBER Nursing notes reviewed and incorporated, Labs reviewed, notes from  prior ED visits.   ____________________________________________   FINAL CLINICAL IMPRESSION(S) / ED DIAGNOSES  Final diagnoses:  Vertigo      NEW MEDICATIONS STARTED DURING THIS VISIT:  Discharge Medication List as of 10/13/2018 11:28 PM    START taking these medications   Details  meclizine (ANTIVERT) 25 MG tablet Take 1 tablet (25 mg total) by mouth 3 (three) times daily as needed for dizziness., Starting Mon 10/13/2018, Normal         Note:  This document was prepared using Dragon voice recognition software and may include unintentional dictation errors.    Willy Eddy, MD 10/13/18 2337

## 2019-06-06 ENCOUNTER — Other Ambulatory Visit: Payer: Self-pay

## 2019-06-06 ENCOUNTER — Emergency Department
Admission: EM | Admit: 2019-06-06 | Discharge: 2019-06-06 | Disposition: A | Discharge status: Home / Self Care | Payer: Self-pay | Attending: Emergency Medicine | Admitting: Emergency Medicine

## 2019-06-06 ENCOUNTER — Encounter: Payer: Self-pay | Admitting: Emergency Medicine

## 2019-06-06 DIAGNOSIS — Z20822 Contact with and (suspected) exposure to covid-19: Secondary | ICD-10-CM

## 2019-06-06 DIAGNOSIS — J0100 Acute maxillary sinusitis, unspecified: Secondary | ICD-10-CM | POA: Insufficient documentation

## 2019-06-06 DIAGNOSIS — F17210 Nicotine dependence, cigarettes, uncomplicated: Secondary | ICD-10-CM | POA: Insufficient documentation

## 2019-06-06 DIAGNOSIS — Z20828 Contact with and (suspected) exposure to other viral communicable diseases: Secondary | ICD-10-CM | POA: Insufficient documentation

## 2019-06-06 MED ORDER — AMOXICILLIN-POT CLAVULANATE 875-125 MG PO TABS: 1.0000 | ORAL_TABLET | Freq: Two times a day (BID) | ORAL | 0 refills | Status: DC

## 2019-06-06 NOTE — ED Provider Notes (Signed)
[facility] Emergency Department Provider Note  ____________________________________________  Time seen: Approximately 9:57 PM  I have reviewed the triage vital signs and the nursing notes.   HISTORY  Chief Complaint Nasal Congestion    HPI Kimberly Mosley is a 23 y.o. female who presents the emergency department complaining of nasal congestion, sinus pressure, sore throat and intermittent cough.  Symptoms began this morning.  Patient reports that main complaint is sinus congestion and pressure.  No fevers or chills, no shortness of breath or difficulty breathing.  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  Patient denies any known contact with anybody with COVID-19.  She is taking Tylenol Cold and sinus medication without relief.  No other complaints at this time.         Past Medical History:  Diagnosis Date  . Anemia   . Anxiety    situational  . H/O Bell's palsy   . Medical history non-contributory     Patient Active Problem List   Diagnosis Date Noted  . Class 1 obesity due to excess calories without serious comorbidity with body mass index (BMI) of 32.0 to 32.9 in adult 06/05/2018  . Panic attack as reaction to stress 10/29/2017  . Anxiety and depression 05/01/2017  . Vaginal bleeding in pregnancy 09/13/2015    Past Surgical History:  Procedure Laterality Date  . NO PAST SURGERIES      Prior to Admission medications   Medication Sig Start Date End Date Taking? Authorizing Provider  amoxicillin-clavulanate (AUGMENTIN) 875-125 MG tablet Take 1 tablet by mouth 2 (two) times daily. 06/06/19   , Delorise RoyalsJonathan D, PA-C  etonogestrel (NEXPLANON) 68 MG IMPL implant 1 each by Subdermal route once.    [provider]  FLUoxetine (PROZAC) 20 MG capsule Take 1 capsule (20 mg total) by mouth daily. Patient not taking: Reported on 06/05/2018 10/29/17   Galen ManilaKennedy, Lauren Renee, NP   hydrOXYzine (ATARAX/VISTARIL) 50 MG tablet Take 1 tablet (50 mg total) by mouth 3 (three) times daily as needed for anxiety. Patient not taking: Reported on 04/17/2018 10/29/17   Galen ManilaKennedy, Lauren Renee, NP  lidocaine (XYLOCAINE) 5 % ointment Apply 1 application topically as needed. 06/05/18   Galen ManilaKennedy, Lauren Renee, NP  meclizine (ANTIVERT) 25 MG tablet Take 1 tablet (25 mg total) by mouth 3 (three) times daily as needed for dizziness. 10/13/18   Willy Eddyobinson, Patrick, MD    Allergies Patient has no known allergies.  Family History  Problem Relation Age of Onset  . Depression Father   . Bipolar disorder Father   . Healthy Mother   . Healthy Sister   . Healthy Brother   . Cancer Maternal Uncle        pancreatic cancer  . Heart disease Maternal Grandmother   . Stroke Maternal Grandmother   . Healthy Maternal Grandfather   . Breast cancer Neg Hx   . Colon cancer Neg Hx   . Ovarian cancer Neg Hx     Social History Social History   Tobacco Use  . Smoking status: Current Every Day Smoker    Packs/day: 0.10    Years: 5.00    Pack years: 0.50    Types: Cigarettes  . Smokeless tobacco: Former Engineer, waterUser  Substance Use Topics  . Alcohol use: No  . Drug use: No     Review of Systems  Constitutional: No fever/chills Eyes: No visual changes. No discharge ENT: Positive for nasal congestion,  sinus pressure Cardiovascular: no chest pain. Respiratory: Intermittent cough. No SOB. Gastrointestinal: No abdominal pain.  No nausea, no vomiting.   Musculoskeletal: Negative for musculoskeletal pain. Skin: Negative for rash, abrasions, lacerations, ecchymosis. Neurological: Negative for headaches, focal weakness or numbness. 10-point ROS otherwise negative.  ____________________________________________   PHYSICAL EXAM:  VITAL SIGNS: ED Triage Vitals [06/06/19 2126]  Enc Vitals Group     BP (!) 147/83     Pulse Rate (!) 105     Resp 18     Temp 98.5 F (36.9 C)     Temp Source Oral      SpO2 99 %     Weight 190 lb (86.2 kg)     Height 5\' 3"  (1.6 m)     Head Circumference      Peak Flow      Pain Score 7     Pain Loc      Pain Edu?      Excl. in GC?      Constitutional: Alert and oriented. Well appearing and in no acute distress. Eyes: Conjunctivae are normal. PERRL. EOMI. Head: Atraumatic. ENT:      Ears:       Nose: Moderate congestion/rhinnorhea.  Patient is tender to percussion over the right maxillary sinus.  No other tenderness to percussion.      Mouth/Throat: Mucous membranes are moist.  Tonsils are mildly erythematous but nonedematous.  Knee is midline.  No exudates. Neck: No stridor.  Neck is supple full range of motion Hematological/Lymphatic/Immunilogical: No cervical lymphadenopathy. Cardiovascular: Normal rate, regular rhythm. Normal S1 and S2.  Good peripheral circulation. Respiratory: Normal respiratory effort without tachypnea or retractions. Lungs CTAB. Good air entry to the bases with no decreased or absent breath sounds. Musculoskeletal: Full range of motion to all extremities. No gross deformities appreciated. Neurologic:  Normal speech and language. No gross focal neurologic deficits are appreciated.  Skin:  Skin is warm, dry and intact. No rash noted. Psychiatric: Mood and affect are normal. Speech and behavior are normal. Patient exhibits appropriate insight and judgement.   ____________________________________________   LABS (all labs ordered are listed, but only abnormal results are displayed)  Labs Reviewed  SARS CORONAVIRUS 2   ____________________________________________  EKG   ____________________________________________  RADIOLOGY   No results found.  ____________________________________________    PROCEDURES  Procedure(s) performed:    Procedures    Medications - No data to display   ____________________________________________   INITIAL IMPRESSION / ASSESSMENT AND PLAN / ED COURSE  Pertinent labs &  imaging results that were available during my care of the patient were reviewed by me and considered in my medical decision making (see chart for details).  Review of the [place] CSRS was performed in accordance of the [facility] prior to dispensing any controlled drugs.        The patient was evaluated for the symptoms described in the history of present illness. The patient was evaluated in the context of the global COVID-19 pandemic, which necessitated consideration that the patient might be at risk for infection with the SARS-CoV-2 virus that causes COVID-19. Institutional protocols and algorithms that pertain to the evaluation of patients at risk for COVID-19 are in a state of rapid change based on information released by regulatory bodies including the CDC and federal and state organizations. The most current policies and algorithms were followed during the patient's care in the ED.    Patient's diagnosis is consistent with sinusitis with concern for COVID-19 infection.  Patient  presented to emergency department sinus congestion, pressure, scratchy throat and intermittent cough.  No fevers or chills.  No shortness of breath.  Exam is reassuring and is consistent with sinusitis.  Patient will be treated with Augmentin for same.  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  Patient will be tested for COVID-19 and results are pending at this time.  Patient will be called with positive results.. Patient will be discharged home with prescriptions for Augmentin. Patient is to follow up with primary care as needed or otherwise directed. Patient is given ED precautions to return to the ED for any worsening or new symptoms.     ____________________________________________  FINAL CLINICAL IMPRESSION(S) / ED DIAGNOSES  Final diagnoses:  Acute non-recurrent maxillary sinusitis  Suspected Covid-19 Virus Infection      NEW MEDICATIONS  STARTED DURING THIS VISIT:  ED Discharge Orders         Ordered    amoxicillin-clavulanate (AUGMENTIN) 875-125 MG tablet  2 times daily     06/06/19 2159              This chart was dictated using voice recognition software/Dragon. Despite best efforts to proofread, errors can occur which can change the meaning. Any change was purely unintentional.    Darletta Moll, PA-C 06/06/19 2203    Delman Kitten, MD 06/06/19 2332

## 2019-06-06 NOTE — ED Triage Notes (Signed)
Pt arrives POV to triage with c/o congestion since this AM. Pt states that she has not had any fevers and her work told her that she would need a doctors note in order to be out. Pt is in NAD.

## 2019-06-06 NOTE — ED Notes (Signed)
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

## 2019-06-07 LAB — SARS CORONAVIRUS 2 (TAT 6-24 HRS)

## 2019-06-16 ENCOUNTER — Telehealth: Payer: Self-pay | Admitting: Emergency Medicine

## 2019-06-16 NOTE — Telephone Encounter (Signed)
Called pateint to assure she is aware that covid test was not completed due to specimen rejection.  She was not aware and had interpretted what she saw on my chart as negative--which was under "reference range"  She will go have another test done at her employer.

## 2019-09-01 ENCOUNTER — Other Ambulatory Visit: Payer: Self-pay

## 2019-09-01 ENCOUNTER — Ambulatory Visit (LOCAL_COMMUNITY_HEALTH_CENTER): Payer: Self-pay

## 2019-09-01 VITALS — BP 114/78 | Ht 62.6 in | Wt 186.2 lb

## 2019-09-01 DIAGNOSIS — Z3202 Encounter for pregnancy test, result negative: Secondary | ICD-10-CM

## 2019-09-01 LAB — PREGNANCY, URINE: Preg Test, Ur: NEGATIVE

## 2019-09-01 NOTE — Progress Notes (Signed)
Per client, does not remember why she was taking Prozac as it's been so long since last taken. Currently taking no medicines. States home UPT was positive today. Encouraged abstinence x10 days with repeat UPT if menses does not begin as usual. Rich Number, RN

## 2019-11-26 ENCOUNTER — Encounter: Payer: Self-pay | Admitting: Emergency Medicine

## 2019-11-26 ENCOUNTER — Emergency Department
Admission: EM | Admit: 2019-11-26 | Discharge: 2019-11-26 | Disposition: A | Discharge status: Home / Self Care | Payer: HRSA Program | Attending: Emergency Medicine | Admitting: Emergency Medicine

## 2019-11-26 ENCOUNTER — Other Ambulatory Visit: Payer: Self-pay

## 2019-11-26 DIAGNOSIS — F17210 Nicotine dependence, cigarettes, uncomplicated: Secondary | ICD-10-CM | POA: Insufficient documentation

## 2019-11-26 DIAGNOSIS — Z20822 Contact with and (suspected) exposure to covid-19: Secondary | ICD-10-CM | POA: Insufficient documentation

## 2019-11-26 DIAGNOSIS — R05 Cough: Secondary | ICD-10-CM | POA: Diagnosis present

## 2019-11-26 LAB — SARS CORONAVIRUS 2 (TAT 6-24 HRS): SARS Coronavirus 2: NEGATIVE

## 2019-11-26 NOTE — ED Triage Notes (Signed)
Pt reports yesterday started with upper resp symptoms that feels like a bad sinus infection and a cough. Pt reports took some OTC meds for it but then threw those up.

## 2019-11-26 NOTE — ED Notes (Signed)
See triage note  Presents with some nasal congestion and cough  sxs' started yesterday  Afebrile on arrival

## 2019-11-26 NOTE — ED Provider Notes (Signed)
[facility] Emergency Department Provider Note  ____________________________________________   First MD Initiated Contact with Patient 11/26/19 1216     (approximate)  I have reviewed the triage vital signs and the nursing notes.   HISTORY  Chief Complaint Nasal Congestion and Cough    HPI Kimberly Mosley is a 24 y.o. female presents emergency department with nasal congestion and cough symptoms started yesterday.  No fever or chills.  Her neck contains her sense of taste and smell.  Works in [employer] and was afraid to go to work due to her symptoms.  She denies any vomiting or diarrhea.    Past Medical History:  Diagnosis Date  . Anemia   . Anxiety    situational  . H/O Bell's palsy   . Medical history non-contributory     Patient Active Problem List   Diagnosis Date Noted  . Class 1 obesity due to excess calories without serious comorbidity with body mass index (BMI) of 32.0 to 32.9 in adult 06/05/2018  . Panic attack as reaction to stress 10/29/2017  . Anxiety and depression 05/01/2017  . Vaginal bleeding in pregnancy 09/13/2015    Past Surgical History:  Procedure Laterality Date  . NO PAST SURGERIES      Prior to Admission medications   Medication Sig Start Date End Date Taking? Authorizing Provider  etonogestrel (NEXPLANON) 68 MG IMPL implant 1 each by Subdermal route once.    [provider]  FLUoxetine (PROZAC) 20 MG capsule Take 1 capsule (20 mg total) by mouth daily. Patient not taking: Reported on 06/05/2018 10/29/17 11/26/19  Galen Manila, NP    Allergies Patient has no known allergies.  Family History  Problem Relation Age of Onset  . Depression Father   . Bipolar disorder Father   . Healthy Mother   . Healthy Sister   . Healthy Brother   . Cancer Maternal Uncle        pancreatic cancer  . Heart disease Maternal Grandmother   . Stroke Maternal Grandmother   . Healthy Maternal Grandfather   . Breast  cancer Neg Hx   . Colon cancer Neg Hx   . Ovarian cancer Neg Hx     Social History Social History   Tobacco Use  . Smoking status: Current Every Day Smoker    Packs/day: 0.50    Years: 7.00    Pack years: 3.50    Types: Cigarettes  . Smokeless tobacco: Never Used  Substance Use Topics  . Alcohol use: Yes    Alcohol/week: 1.0 standard drinks    Types: 1 Cans of beer per week    Comment: Last ETOH use 2 days ago.  . Drug use: No    Review of Systems  Constitutional: No fever/chills Eyes: No visual changes. ENT: Positive for runny nose, congestion, sore throat. Respiratory: Positive cough Cardiovascular: Denies chest pain Gastrointestinal: Denies abdominal pain Genitourinary: Negative for dysuria. Musculoskeletal: Negative for back pain. Skin: Negative for rash. Psychiatric: no mood changes,     ____________________________________________   PHYSICAL EXAM:  VITAL SIGNS: ED Triage Vitals  Enc Vitals Group     BP 11/26/19 1207 117/70     Pulse Rate 11/26/19 1207 86     Resp 11/26/19 1207 20     Temp 11/26/19 1207 97.7 F (36.5 C)     Temp Source 11/26/19 1207 Oral     SpO2 11/26/19 1207 97 %     Weight 11/26/19 1205 180 lb (81.6  kg)     Height 11/26/19 1205 5\' 3"  (1.6 m)     Head Circumference --      Peak Flow --      Pain Score 11/26/19 1205 0     Pain Loc --      Pain Edu? --      Excl. in [place]? --     Constitutional: Alert and oriented. Well appearing and in no acute distress. Eyes: Conjunctivae are normal.  Head: Atraumatic. Nose: No congestion/rhinnorhea. Mouth/Throat: Mucous membranes are moist.  Throat is irritated from postnasal drip Neck:  supple no lymphadenopathy noted Cardiovascular: Normal rate, regular rhythm. Heart sounds are normal Respiratory: Normal respiratory effort.  No retractions, lungs c t a  Abd: soft nontender bs normal all 4 quad GU: deferred Musculoskeletal: FROM all extremities, warm and well perfused Neurologic:  Normal  speech and language.  Skin:  Skin is warm, dry and intact. No rash noted. Psychiatric: Mood and affect are normal. Speech and behavior are normal.  ____________________________________________   LABS (all labs ordered are listed, but only abnormal results are displayed)  Labs Reviewed  SARS CORONAVIRUS 2 (TAT 6-24 HRS)   ____________________________________________   ____________________________________________  RADIOLOGY    ____________________________________________   PROCEDURES  Procedure(s) performed: No  Procedures    ____________________________________________   INITIAL IMPRESSION / ASSESSMENT AND PLAN / ED COURSE  Pertinent labs & imaging results that were available during my care of the patient were reviewed by me and considered in my medical decision making (see chart for details).   Patient is 24 year old woman presents emergency department Covid-like symptoms.  See HPI  Physical exam shows patient to appear nontoxic.  Has some nasal congestion.  Remainder exam is unremarkable  Covid test ordered  Explained findings to the patient.  I feel that she most likely has a viral URI or Covid.  Explained her she should remain quarantined until her Covid test results.  She is given a work note explaining the same.  She is to use over-the-counter Mucinex.  Return emergency department worsening.  She states she understands will comply.  She is discharged stable condition.    Kimberly Mosley was evaluated in Emergency Department on 11/26/2019 for the symptoms described in the history of present illness. She was evaluated in the context of the global COVID-19 pandemic, which necessitated consideration that the patient might be at risk for infection with the SARS-CoV-2 virus that causes COVID-19. Institutional protocols and algorithms that pertain to the evaluation of patients at risk for COVID-19 are in a state of rapid change based on information released by regulatory  bodies including the CDC and federal and state organizations. These policies and algorithms were followed during the patient's care in the ED.   As part of my medical decision making, I reviewed the following data within the [place] notes reviewed and incorporated, Old chart reviewed, Notes from prior ED visits and [place] Controlled Substance Database  ____________________________________________   FINAL CLINICAL IMPRESSION(S) / ED DIAGNOSES  Final diagnoses:  Suspected COVID-19 virus infection      NEW MEDICATIONS STARTED DURING THIS VISIT:  Discharge Medication List as of 11/26/2019 12:37 PM       Note:  This document was prepared using Dragon voice recognition software and may include unintentional dictation errors.    Versie Starks, PA-C 11/26/19 1406    Carrie Mew, MD 11/26/19 [phone]

## 2019-11-26 NOTE — Discharge Instructions (Signed)
Follow-up with your regular doctor if not improving in 3 to 4 days.  Return emergency department if worsening.  Take over-the-counter Mucinex.  Covid test should result in 6 to 24 hours.  You are to remain quarantined until you have received your test result.  If negative you may go back to daily activities.  If positive remain quarantined for 10 to 14 days.

## 2019-12-09 ENCOUNTER — Emergency Department
Admission: EM | Admit: 2019-12-09 | Discharge: 2019-12-09 | Disposition: A | Discharge status: Home / Self Care | Payer: Self-pay | Attending: Student | Admitting: Student

## 2019-12-09 ENCOUNTER — Other Ambulatory Visit: Payer: Self-pay

## 2019-12-09 ENCOUNTER — Encounter: Payer: Self-pay | Admitting: Emergency Medicine

## 2019-12-09 DIAGNOSIS — F17210 Nicotine dependence, cigarettes, uncomplicated: Secondary | ICD-10-CM | POA: Insufficient documentation

## 2019-12-09 DIAGNOSIS — J020 Streptococcal pharyngitis: Secondary | ICD-10-CM | POA: Insufficient documentation

## 2019-12-09 DIAGNOSIS — Z793 Long term (current) use of hormonal contraceptives: Secondary | ICD-10-CM | POA: Insufficient documentation

## 2019-12-09 MED ORDER — PREDNISONE 10 MG PO TABS: 50.0000 mg | ORAL_TABLET | Freq: Every day | ORAL | 0 refills | Status: DC

## 2019-12-09 MED ORDER — AMOXICILLIN 500 MG PO TABS: 500.0000 mg | ORAL_TABLET | Freq: Two times a day (BID) | ORAL | 0 refills | Status: DC

## 2019-12-09 MED ORDER — PREDNISONE 20 MG PO TABS
60.0000 mg | ORAL_TABLET | Freq: Once | ORAL | Status: AC
  Administered 2019-12-09: 60 mg via ORAL
  Filled 2019-12-09: qty 3

## 2019-12-09 MED ORDER — AMOXICILLIN 500 MG PO CAPS
500.0000 mg | ORAL_CAPSULE | Freq: Once | ORAL | Status: AC
  Administered 2019-12-09: 500 mg via ORAL
  Filled 2019-12-09: qty 1

## 2019-12-09 NOTE — ED Provider Notes (Signed)
[facility] Emergency Department Provider Note  ____________________________________________  Time seen: Approximately 10:07 PM  I have reviewed the triage vital signs and the nursing notes.   HISTORY  Chief Complaint Sore Throat    HPI Kimberly Mosley is a 24 y.o. female presents to the emergency department for treatment and evaluation of sore throat. Symptoms started yesterday and have progressively worsened today.   Past Medical History:  Diagnosis Date  . Anemia   . Anxiety    situational  . H/O Bell's palsy   . Medical history non-contributory     Patient Active Problem List   Diagnosis Date Noted  . Class 1 obesity due to excess calories without serious comorbidity with body mass index (BMI) of 32.0 to 32.9 in adult 06/05/2018  . Panic attack as reaction to stress 10/29/2017  . Anxiety and depression 05/01/2017  . Vaginal bleeding in pregnancy 09/13/2015    Past Surgical History:  Procedure Laterality Date  . NO PAST SURGERIES      Prior to Admission medications   Medication Sig Start Date End Date Taking? Authorizing Provider  amoxicillin (AMOXIL) 500 MG tablet Take 1 tablet (500 mg total) by mouth 2 (two) times daily. 12/09/19   Tajay Muzzy, Johnette Abraham B, FNP  etonogestrel (NEXPLANON) 68 MG IMPL implant 1 each by Subdermal route once.    [provider]  predniSONE (DELTASONE) 10 MG tablet Take 5 tablets (50 mg total) by mouth daily. 12/09/19   Harleigh Civello, Johnette Abraham B, FNP  FLUoxetine (PROZAC) 20 MG capsule Take 1 capsule (20 mg total) by mouth daily. Patient not taking: Reported on 06/05/2018 10/29/17 11/26/19  Mikey College, NP    Allergies Patient has no known allergies.  Family History  Problem Relation Age of Onset  . Depression Father   . Bipolar disorder Father   . Healthy Mother   . Healthy Sister   . Healthy Brother   . Cancer Maternal Uncle        pancreatic cancer  . Heart disease Maternal Grandmother   . Stroke Maternal  Grandmother   . Healthy Maternal Grandfather   . Breast cancer Neg Hx   . Colon cancer Neg Hx   . Ovarian cancer Neg Hx     Social History Social History   Tobacco Use  . Smoking status: Current Every Day Smoker    Packs/day: 0.50    Years: 7.00    Pack years: 3.50    Types: Cigarettes  . Smokeless tobacco: Never Used  Substance Use Topics  . Alcohol use: Yes    Alcohol/week: 1.0 standard drinks    Types: 1 Cans of beer per week    Comment: Last ETOH use 2 days ago.  . Drug use: No    Review of Systems Constitutional: Positive for fever. Eyes: No visual changes. ENT: Positive for sore throat; negative for difficulty swallowing. Respiratory: Denies shortness of breath. Gastrointestinal: Negative for abdominal pain.  No nausea, no vomiting.  No diarrhea.  Genitourinary: Negative for dysuria. Negative for decrease in need to void. Musculoskeletal: Positive for generalized body aches. Skin: Negative for rash. Neurological: Positive for headaches, negative for focal weakness or numbness.  ____________________________________________   PHYSICAL EXAM:  VITAL SIGNS: ED Triage Vitals [12/09/19 2117]  Enc Vitals Group     BP (!) 154/90     Pulse Rate (!) 112     Resp 18     Temp 98.1 F (36.7 C)     Temp Source Oral  SpO2 100 %     Weight 180 lb (81.6 kg)     Height 5\' 3"  (1.6 m)     Head Circumference      Peak Flow      Pain Score 8     Pain Loc      Pain Edu?      Excl. in GC?     Constitutional: Alert and oriented. Well appearing and in no acute distress. Eyes: Conjunctivae are normal.  Head: Atraumatic. Nose: No congestion/rhinnorhea. Mouth/Throat: Mucous membranes are moist.  Oropharynx erythematous, tonsils 3+ with exudate. Uvula is midline. Ears: Right tympanic membrane appears normal. Left tympanic membrane appears normal. Neck: No stridor. Voice clear Lymphatic: Anterior cervical nodes palpable and tender. Cardiovascular: Normal rate, regular  rhythm. Good peripheral circulation. Respiratory: Normal respiratory effort. Lungs CTAB. Gastrointestinal: Soft and nontender. Musculoskeletal: FROM of neck, upper and lower extremities. Neurologic:  Normal speech and language. No gross focal neurologic deficits are appreciated. Skin:  Skin is warm, dry and intact. No rash noted Psychiatric: Mood and affect are normal. Speech and behavior are normal.  ____________________________________________   LABS (all labs ordered are listed, but only abnormal results are displayed)  Labs Reviewed - No data to display ____________________________________________  EKG  Not indiated ____________________________________________  RADIOLOGY  Not indicated. ____________________________________________   PROCEDURES  Procedure(s) performed: None  Critical Care performed: No ____________________________________________   INITIAL IMPRESSION / ASSESSMENT AND PLAN / ED COURSE  24 year old female presents with 2-day history of sore throat.  Symptoms and exam most consistent with strep throat.  She will be treated with amoxicillin and prednisone.  She was advised to take Tylenol or ibuprofen for pain or fever.  She is to return to the emergency department or follow-up with primary care for symptoms of change or worsen.  Pertinent labs & imaging results that were available during my care of the patient were reviewed by me and considered in my medical decision making (see chart for details). ____________________________________________  New Prescriptions   AMOXICILLIN (AMOXIL) 500 MG TABLET    Take 1 tablet (500 mg total) by mouth 2 (two) times daily.   PREDNISONE (DELTASONE) 10 MG TABLET    Take 5 tablets (50 mg total) by mouth daily.    FINAL CLINICAL IMPRESSION(S) / ED DIAGNOSES  Final diagnoses:  Strep throat    If controlled substance prescribed during this visit, 12 month history viewed on the [place] prior to issuing an initial  prescription for Schedule II or III opiod.   Note:  This document was prepared using Dragon voice recognition software and may include unintentional dictation errors.   22, FNP 12/09/19 2218    2219., MD 12/10/19 [phone]

## 2019-12-09 NOTE — ED Triage Notes (Signed)
Patient ambulatory to triage with steady gait, without difficulty or distress noted, mask in place; pt reports sore throat last several days with no accomp symptoms; seen recently for URI but those symptoms had resolved

## 2019-12-09 NOTE — Discharge Instructions (Signed)
Take tylenol or ibuprofen for pain and/or fever. Follow up with primary care or return to the ER for symptoms that change or worsen.

## 2019-12-21 ENCOUNTER — Encounter: Payer: Self-pay | Admitting: *Deleted

## 2019-12-21 ENCOUNTER — Other Ambulatory Visit: Payer: Self-pay

## 2019-12-21 DIAGNOSIS — N644 Mastodynia: Secondary | ICD-10-CM | POA: Diagnosis present

## 2019-12-21 DIAGNOSIS — F17210 Nicotine dependence, cigarettes, uncomplicated: Secondary | ICD-10-CM | POA: Diagnosis not present

## 2019-12-21 DIAGNOSIS — L089 Local infection of the skin and subcutaneous tissue, unspecified: Secondary | ICD-10-CM | POA: Insufficient documentation

## 2019-12-21 DIAGNOSIS — R222 Localized swelling, mass and lump, trunk: Secondary | ICD-10-CM | POA: Diagnosis not present

## 2019-12-21 NOTE — ED Triage Notes (Signed)
Pt has red, bruised swollen area to left lower breast for 2 days.  No drainage. Area tender to touch.  Pt alert  Speech clear.

## 2019-12-22 ENCOUNTER — Emergency Department
Admission: EM | Admit: 2019-12-22 | Discharge: 2019-12-22 | Disposition: A | Discharge status: Home / Self Care | Payer: Medicaid Other | Attending: Emergency Medicine | Admitting: Emergency Medicine

## 2019-12-22 ENCOUNTER — Emergency Department: Payer: Medicaid Other

## 2019-12-22 DIAGNOSIS — B999 Unspecified infectious disease: Secondary | ICD-10-CM

## 2019-12-22 DIAGNOSIS — R609 Edema, unspecified: Secondary | ICD-10-CM

## 2019-12-22 MED ORDER — CLINDAMYCIN HCL 150 MG PO CAPS
300.0000 mg | ORAL_CAPSULE | Freq: Once | ORAL | Status: AC
  Administered 2019-12-22: 300 mg via ORAL
  Filled 2019-12-22: qty 2

## 2019-12-22 MED ORDER — CLINDAMYCIN HCL 300 MG PO CAPS: 300.0000 mg | ORAL_CAPSULE | Freq: Three times a day (TID) | ORAL | 0 refills | Status: DC

## 2019-12-22 NOTE — ED Provider Notes (Signed)
[facility] Emergency Department Provider Note   ____________________________________________   First MD Initiated Contact with Patient 12/22/19 0050     (approximate)  I have reviewed the triage vital signs and the nursing notes.   HISTORY  Chief Complaint Breast Pain    HPI Kimberly Mosley is a 24 y.o. female history of previous anemia, anxiety  Patient reports for a couple days now she has been feeling feels like on episodes of like hot flashes warm flashes associated also with some tenderness and swelling on the lower part of her left breast below the nipple line.  She does have a piercing in the left nipple, at times is felt a little uncomfortable but she seen no swelling or drainage from the nipple.  She is not breast-feeding.  Denies pregnancy.  The left breast has become red and sore underneath the bottom.  No discharge however.  Skin is not broken but there is an area of skin that seems a little bit dark in color as well.  Sister took a picture of it and showed her, and she came to the ER to have this evaluated  Denies history of breast cancer.  No nausea or vomiting.  No chest pain.  No trouble breathing.   Past Medical History:  Diagnosis Date  . Anemia   . Anxiety    situational  . H/O Bell's palsy   . Medical history non-contributory     Patient Active Problem List   Diagnosis Date Noted  . Class 1 obesity due to excess calories without serious comorbidity with body mass index (BMI) of 32.0 to 32.9 in adult 06/05/2018  . Panic attack as reaction to stress 10/29/2017  . Anxiety and depression 05/01/2017  . Vaginal bleeding in pregnancy 09/13/2015    Past Surgical History:  Procedure Laterality Date  . NO PAST SURGERIES      Prior to Admission medications   Medication Sig Start Date End Date Taking? Authorizing Provider  amoxicillin (AMOXIL) 500 MG tablet Take 1 tablet (500 mg total) by mouth 2 (two) times daily. 12/09/19    Triplett, Johnette Abraham B, FNP  clindamycin (CLEOCIN) 300 MG capsule Take 1 capsule (300 mg total) by mouth 3 (three) times daily. 12/22/19   Delman Kitten, MD  etonogestrel (NEXPLANON) 68 MG IMPL implant 1 each by Subdermal route once.    [provider]  predniSONE (DELTASONE) 10 MG tablet Take 5 tablets (50 mg total) by mouth daily. 12/09/19   Triplett, Johnette Abraham B, FNP  FLUoxetine (PROZAC) 20 MG capsule Take 1 capsule (20 mg total) by mouth daily. Patient not taking: Reported on 06/05/2018 10/29/17 11/26/19  Mikey College, NP    Allergies Patient has no known allergies.  Family History  Problem Relation Age of Onset  . Depression Father   . Bipolar disorder Father   . Healthy Mother   . Healthy Sister   . Healthy Brother   . Cancer Maternal Uncle        pancreatic cancer  . Heart disease Maternal Grandmother   . Stroke Maternal Grandmother   . Healthy Maternal Grandfather   . Breast cancer Neg Hx   . Colon cancer Neg Hx   . Ovarian cancer Neg Hx     Social History Social History   Tobacco Use  . Smoking status: Current Every Day Smoker    Packs/day: 0.50    Years: 7.00    Pack years: 3.50    Types: Cigarettes  .  Smokeless tobacco: Never Used  Substance Use Topics  . Alcohol use: Not Currently    Alcohol/week: 1.0 standard drinks    Types: 1 Cans of beer per week    Comment: Last ETOH use 2 days ago.  . Drug use: No    Review of Systems Constitutional: No fever/chills has had a couple episodes where she felt like she was having warm flashes over the last couple days Eyes: No visual changes. ENT: No sore throat. Cardiovascular: Denies chest pain.  See HPI regarding left breast Respiratory: Denies shortness of breath. Gastrointestinal: No abdominal pain.   Genitourinary: Denies pregnancy. Musculoskeletal: Negative for back pain. Skin: Negative for rash except under left breast. Neurological: Negative for  headaches.    ____________________________________________   PHYSICAL EXAM:  VITAL SIGNS: ED Triage Vitals  Enc Vitals Group     BP 12/21/19 2329 121/71     Pulse Rate 12/21/19 2329 93     Resp 12/21/19 2329 20     Temp 12/21/19 2329 97.9 F (36.6 C)     Temp Source 12/21/19 2329 Oral     SpO2 12/21/19 2329 98 %     Weight 12/21/19 2330 180 lb (81.6 kg)     Height 12/21/19 2330 5\' 3"  (1.6 m)     Head Circumference --      Peak Flow --      Pain Score 12/21/19 2330 8     Pain Loc --      Pain Edu? --      Excl. in GC? --     Constitutional: Alert and oriented. Well appearing and in no acute distress. Eyes: Conjunctivae are normal. Head: Atraumatic. Nose: No congestion/rhinnorhea. Mouth/Throat: Mucous membranes are moist. Neck: No stridor.  Cardiovascular: Normal rate, regular rhythm. Grossly normal heart sounds.  Good peripheral circulation. Left breast examined with nurse 02/20/20.  The left breast superior and periareolar region is normal without tenderness erythema or induration.  Below the areola on the left there is an area about the size of the palm that is erythematous, warm to touch, slightly tender, and there is a small area about the size of a dime that appears slightly darkened and appears scaled, also some slight induration below this area.  Patient reports the area is mildly tender. Respiratory: Normal respiratory effort.  No retractions. Lungs CTAB. Gastrointestinal: Soft and nontender.  Musculoskeletal: No lower extremity tenderness nor edema. Neurologic:  Normal speech and language. No gross focal neurologic deficits are appreciated.  Skin:  Skin is warm, dry and intact. No rash noted. Psychiatric: Mood and affect are normal. Speech and behavior are normal.  ____________________________________________   LABS (all labs ordered are listed, but only abnormal results are displayed)  Labs Reviewed - No data to  display ____________________________________________  EKG   ____________________________________________  RADIOLOGY  No results found.    Discussed with ultrasound technician Berline Lopes and Dr. Lupita Leash ____________________________________________   PROCEDURES  Procedure(s) performed: None  Procedures  Critical Care performed: No  ____________________________________________   INITIAL IMPRESSION / ASSESSMENT AND PLAN / ED COURSE  Pertinent labs & imaging results that were available during my care of the patient were reviewed by me and considered in my medical decision making (see chart for details).   Left breast redness swelling tenderness associated with feeling of slight hot flashes.  Reassuring exam without evidence of sepsis.  However examination concerning for possible infection or possible small abscess in the left breast, alternatively malignancy could be considered or other breast  etiologies.  I suspect however based on her symptoms and exam that she likely has a small abscess or area of induration below the skin over the left with surrounding erythema likely indicative of infection.  I discussed carefully with the patient though that she will need close follow-up, and she will need follow-up specifically to assure that this improves and also that this could not be a potential malignancy or breast cancer type symptom.  Patient understands this, she is seen at [facility], I will also recommend that she follow-up closely with general surgery.  Ultrasound, case care, and treatment plan discussed with Dr. Maia Plan of general surgery.  Gave him patient info, and he will have the clinic set up for close follow-up   Clinical Course as of Dec 22 238  Tue Dec 22, 2019  0228 Dr. Karle Starch advises cellulitic changes and possibly a very early abscess but no drain able abcess (22mm).    [MQ]    Clinical Course User Index [MQ] Sharyn Creamer, MD    ----------------------------------------- 2:39 AM on 12/22/2019 -----------------------------------------  Return precautions and treatment recommendations and follow-up discussed with the patient who is agreeable with the plan.  ____________________________________________   FINAL CLINICAL IMPRESSION(S) / ED DIAGNOSES  Final diagnoses:  Swelling  Infection  Left breast cellulitis      Note:  This document was prepared using Dragon voice recognition software and may include unintentional dictation errors       Sharyn Creamer, MD 12/22/19 0240

## 2019-12-24 ENCOUNTER — Emergency Department
Admission: EM | Admit: 2019-12-24 | Discharge: 2019-12-24 | Disposition: A | Discharge status: Home / Self Care | Payer: Medicaid Other | Attending: Emergency Medicine | Admitting: Emergency Medicine

## 2019-12-24 ENCOUNTER — Other Ambulatory Visit: Payer: Self-pay

## 2019-12-24 ENCOUNTER — Inpatient Hospital Stay: Payer: Medicaid Other | Admitting: Anesthesiology

## 2019-12-24 ENCOUNTER — Inpatient Hospital Stay
Admission: AD | Admit: 2019-12-24 | Discharge: 2019-12-27 | DRG: 585 | Disposition: A | Discharge status: Home / Self Care | Payer: Medicaid Other | Source: Ambulatory Visit | Attending: General Surgery | Admitting: General Surgery

## 2019-12-24 ENCOUNTER — Encounter: Payer: Self-pay | Admitting: General Surgery

## 2019-12-24 ENCOUNTER — Encounter: Payer: Self-pay | Admitting: Emergency Medicine

## 2019-12-24 ENCOUNTER — Encounter
Admission: AD | Disposition: A | Discharge status: Home / Self Care | Payer: Self-pay | Source: Ambulatory Visit | Attending: General Surgery

## 2019-12-24 DIAGNOSIS — Z20822 Contact with and (suspected) exposure to covid-19: Secondary | ICD-10-CM | POA: Diagnosis present

## 2019-12-24 DIAGNOSIS — Z8249 Family history of ischemic heart disease and other diseases of the circulatory system: Secondary | ICD-10-CM | POA: Diagnosis not present

## 2019-12-24 DIAGNOSIS — L98499 Non-pressure chronic ulcer of skin of other sites with unspecified severity: Secondary | ICD-10-CM

## 2019-12-24 DIAGNOSIS — F17210 Nicotine dependence, cigarettes, uncomplicated: Secondary | ICD-10-CM | POA: Diagnosis present

## 2019-12-24 DIAGNOSIS — Z825 Family history of asthma and other chronic lower respiratory diseases: Secondary | ICD-10-CM

## 2019-12-24 DIAGNOSIS — Z800 Family history of malignant neoplasm of digestive organs: Secondary | ICD-10-CM

## 2019-12-24 DIAGNOSIS — Z818 Family history of other mental and behavioral disorders: Secondary | ICD-10-CM

## 2019-12-24 DIAGNOSIS — R234 Changes in skin texture: Secondary | ICD-10-CM | POA: Insufficient documentation

## 2019-12-24 DIAGNOSIS — N61 Mastitis without abscess: Secondary | ICD-10-CM | POA: Diagnosis present

## 2019-12-24 DIAGNOSIS — N611 Abscess of the breast and nipple: Secondary | ICD-10-CM | POA: Diagnosis present

## 2019-12-24 DIAGNOSIS — Z823 Family history of stroke: Secondary | ICD-10-CM | POA: Diagnosis not present

## 2019-12-24 DIAGNOSIS — B9561 Methicillin susceptible Staphylococcus aureus infection as the cause of diseases classified elsewhere: Secondary | ICD-10-CM | POA: Diagnosis present

## 2019-12-24 HISTORY — PX: INCISION AND DRAINAGE ABSCESS: SHX5864

## 2019-12-24 LAB — CBC WITH DIFFERENTIAL/PLATELET
Abs Immature Granulocytes: 0.04 10*3/uL (ref 0.00–0.07)
Basophils Absolute: 0.1 10*3/uL (ref 0.0–0.1)
Basophils Relative: 0 %
Eosinophils Absolute: 0.1 10*3/uL (ref 0.0–0.5)
Eosinophils Relative: 1 %
HCT: 42.3 % (ref 36.0–46.0)
Hemoglobin: 13.8 g/dL (ref 12.0–15.0)
Immature Granulocytes: 0 %
Lymphocytes Relative: 30 %
Lymphs Abs: 3.7 10*3/uL (ref 0.7–4.0)
MCH: 29.4 pg (ref 26.0–34.0)
MCHC: 32.6 g/dL (ref 30.0–36.0)
MCV: 90.2 fL (ref 80.0–100.0)
Monocytes Absolute: 0.8 10*3/uL (ref 0.1–1.0)
Monocytes Relative: 6 %
Neutro Abs: 7.8 10*3/uL — ABNORMAL HIGH (ref 1.7–7.7)
Neutrophils Relative %: 63 %
Platelets: 272 10*3/uL (ref 150–400)
RBC: 4.69 MIL/uL (ref 3.87–5.11)
RDW: 13.4 % (ref 11.5–15.5)
WBC: 12.5 10*3/uL — ABNORMAL HIGH (ref 4.0–10.5)
nRBC: 0 % (ref 0.0–0.2)

## 2019-12-24 LAB — BASIC METABOLIC PANEL
Anion gap: 10 (ref 5–15)
BUN: 11 mg/dL (ref 6–20)
CO2: 23 mmol/L (ref 22–32)
Calcium: 9.3 mg/dL (ref 8.9–10.3)
Chloride: 105 mmol/L (ref 98–111)
Creatinine, Ser: 0.74 mg/dL (ref 0.44–1.00)
GFR calc Af Amer: >60 mL/min (ref >60)
GFR calc non Af Amer: >60 mL/min (ref >60)
Glucose, Bld: 102 mg/dL — ABNORMAL HIGH (ref 70–99)
Potassium: 3.9 mmol/L (ref 3.5–5.1)
Sodium: 138 mmol/L (ref 135–145)

## 2019-12-24 LAB — PROCALCITONIN: Procalcitonin: <0.1 ng/mL

## 2019-12-24 LAB — RESPIRATORY PANEL BY RT PCR (FLU A&B, COVID)
Influenza A by PCR: NEGATIVE
Influenza B by PCR: NEGATIVE
SARS Coronavirus 2 by RT PCR: NEGATIVE

## 2019-12-24 LAB — SURGICAL PCR SCREEN
MRSA, PCR: NEGATIVE
Staphylococcus aureus: NEGATIVE

## 2019-12-24 LAB — LACTIC ACID, PLASMA: Lactic Acid, Venous: 1.3 mmol/L (ref 0.5–1.9)

## 2019-12-24 LAB — PREGNANCY, URINE: Preg Test, Ur: NEGATIVE

## 2019-12-24 SURGERY — INCISION AND DRAINAGE, ABSCESS
Anesthesia: General | Laterality: Left

## 2019-12-24 MED ORDER — PROMETHAZINE HCL 25 MG/ML IJ SOLN
6.2500 mg | Freq: Once | INTRAMUSCULAR | Status: AC
  Administered 2019-12-24: 6.25 mg via INTRAVENOUS

## 2019-12-24 MED ORDER — VANCOMYCIN HCL IN DEXTROSE 1-5 GM/200ML-% IV SOLN
1000.0000 mg | Freq: Once | INTRAVENOUS | Status: DC
  Filled 2019-12-24 (×2): qty 200

## 2019-12-24 MED ORDER — OXYCODONE HCL 5 MG PO TABS: 5.0000 mg | ORAL_TABLET | Freq: Once | ORAL | Status: DC | PRN

## 2019-12-24 MED ORDER — KETOROLAC TROMETHAMINE 30 MG/ML IJ SOLN
15.0000 mg | Freq: Once | INTRAMUSCULAR | Status: AC
  Administered 2019-12-24: 15 mg via INTRAVENOUS
  Filled 2019-12-24: qty 1

## 2019-12-24 MED ORDER — VANCOMYCIN HCL 10 G IV SOLR
2250.0000 mg | INTRAVENOUS | Status: DC
  Filled 2019-12-24: qty 2250

## 2019-12-24 MED ORDER — OXYCODONE HCL 5 MG/5ML PO SOLN: 5.0000 mg | Freq: Once | ORAL | Status: DC | PRN

## 2019-12-24 MED ORDER — PROPOFOL 10 MG/ML IV BOLUS
INTRAVENOUS | Status: AC
  Filled 2019-12-24: qty 20

## 2019-12-24 MED ORDER — LACTATED RINGERS IV SOLN: INTRAVENOUS | Status: DC | PRN

## 2019-12-24 MED ORDER — FENTANYL CITRATE (PF) 100 MCG/2ML IJ SOLN: 25.0000 ug | INTRAMUSCULAR | Status: DC | PRN

## 2019-12-24 MED ORDER — PROPOFOL 10 MG/ML IV BOLUS
INTRAVENOUS | Status: DC | PRN
  Administered 2019-12-24: 200 mg via INTRAVENOUS

## 2019-12-24 MED ORDER — ENOXAPARIN SODIUM 40 MG/0.4ML ~~LOC~~ SOLN
40.0000 mg | SUBCUTANEOUS | Status: DC
  Administered 2019-12-24 – 2019-12-26 (×3): 40 mg via SUBCUTANEOUS
  Filled 2019-12-24 (×3): qty 0.4

## 2019-12-24 MED ORDER — VANCOMYCIN HCL 10 G IV SOLR: 2250.0000 mg | INTRAVENOUS | Status: DC

## 2019-12-24 MED ORDER — ACETAMINOPHEN 650 MG RE SUPP: 650.0000 mg | Freq: Four times a day (QID) | RECTAL | Status: DC | PRN

## 2019-12-24 MED ORDER — ACETAMINOPHEN 325 MG PO TABS
650.0000 mg | ORAL_TABLET | Freq: Four times a day (QID) | ORAL | Status: DC | PRN
  Administered 2019-12-26: 650 mg via ORAL
  Filled 2019-12-24 (×2): qty 2

## 2019-12-24 MED ORDER — VANCOMYCIN HCL 2000 MG/400ML IV SOLN
2000.0000 mg | Freq: Once | INTRAVENOUS | Status: AC
  Administered 2019-12-24: 2000 mg via INTRAVENOUS
  Filled 2019-12-24: qty 400

## 2019-12-24 MED ORDER — PROMETHAZINE HCL 25 MG/ML IJ SOLN
INTRAMUSCULAR | Status: AC
  Filled 2019-12-24: qty 1

## 2019-12-24 MED ORDER — KETOROLAC TROMETHAMINE 30 MG/ML IJ SOLN
INTRAMUSCULAR | Status: DC | PRN
  Administered 2019-12-24: 30 mg via INTRAVENOUS

## 2019-12-24 MED ORDER — ONDANSETRON 4 MG PO TBDP: 4.0000 mg | ORAL_TABLET | Freq: Four times a day (QID) | ORAL | Status: DC | PRN

## 2019-12-24 MED ORDER — ONDANSETRON HCL 4 MG/2ML IJ SOLN
4.0000 mg | Freq: Four times a day (QID) | INTRAMUSCULAR | Status: DC | PRN
  Administered 2019-12-24: 4 mg via INTRAVENOUS

## 2019-12-24 MED ORDER — SODIUM CHLORIDE FLUSH 0.9 % IV SOLN
INTRAVENOUS | Status: AC
  Filled 2019-12-24: qty 10

## 2019-12-24 MED ORDER — PIPERACILLIN-TAZOBACTAM 3.375 G IVPB
3.3750 g | Freq: Three times a day (TID) | INTRAVENOUS | Status: DC
  Administered 2019-12-24 – 2019-12-25 (×3): 3.375 g via INTRAVENOUS
  Filled 2019-12-24 (×3): qty 50

## 2019-12-24 MED ORDER — FENTANYL CITRATE (PF) 100 MCG/2ML IJ SOLN
INTRAMUSCULAR | Status: DC | PRN
  Administered 2019-12-24 (×2): 50 ug via INTRAVENOUS

## 2019-12-24 MED ORDER — ACETAMINOPHEN 10 MG/ML IV SOLN
INTRAVENOUS | Status: DC | PRN
  Administered 2019-12-24: 1000 mg via INTRAVENOUS

## 2019-12-24 MED ORDER — SODIUM CHLORIDE 0.9 % IV SOLN: INTRAVENOUS | Status: DC

## 2019-12-24 MED ORDER — LIDOCAINE HCL (CARDIAC) PF 100 MG/5ML IV SOSY
PREFILLED_SYRINGE | INTRAVENOUS | Status: DC | PRN
  Administered 2019-12-24: 100 mg via INTRAVENOUS

## 2019-12-24 MED ORDER — POVIDONE-IODINE 5 % EX SOLN
CUTANEOUS | Status: DC | PRN
  Administered 2019-12-24: 1 via TOPICAL

## 2019-12-24 MED ORDER — FENTANYL CITRATE (PF) 100 MCG/2ML IJ SOLN
INTRAMUSCULAR | Status: AC
  Filled 2019-12-24: qty 2

## 2019-12-24 MED ORDER — MIDAZOLAM HCL 2 MG/2ML IJ SOLN
INTRAMUSCULAR | Status: DC | PRN
  Administered 2019-12-24: 2 mg via INTRAVENOUS

## 2019-12-24 MED ORDER — MIDAZOLAM HCL 2 MG/2ML IJ SOLN
INTRAMUSCULAR | Status: AC
  Filled 2019-12-24: qty 2

## 2019-12-24 MED ORDER — PIPERACILLIN-TAZOBACTAM 3.375 G IVPB 30 MIN
3.3750 g | Freq: Once | INTRAVENOUS | Status: DC
  Filled 2019-12-24 (×3): qty 50

## 2019-12-24 MED ORDER — ACETAMINOPHEN NICU IV SYRINGE 10 MG/ML
INTRAVENOUS | Status: AC
  Filled 2019-12-24: qty 1

## 2019-12-24 MED ORDER — PHENYLEPHRINE HCL (PRESSORS) 10 MG/ML IV SOLN
INTRAVENOUS | Status: DC | PRN
  Administered 2019-12-24 (×2): 100 ug via INTRAVENOUS

## 2019-12-24 MED ORDER — DEXAMETHASONE SODIUM PHOSPHATE 10 MG/ML IJ SOLN
INTRAMUSCULAR | Status: DC | PRN
  Administered 2019-12-24: 10 mg via INTRAVENOUS

## 2019-12-24 SURGICAL SUPPLY — 24 items
BLADE CLIPPER SURG (BLADE) ×3 IMPLANT
BLADE SURG 15 STRL LF DISP TIS (BLADE) ×1 IMPLANT
BLADE SURG 15 STRL SS (BLADE) ×2
BRUSH SCRUB EZ  4% CHG (MISCELLANEOUS) ×2
BRUSH SCRUB EZ 4% CHG (MISCELLANEOUS) ×1 IMPLANT
CANISTER SUCT 3000ML PPV (MISCELLANEOUS) ×3 IMPLANT
COVER WAND RF STERILE (DRAPES) ×3 IMPLANT
DRAPE CHEST BREAST 77X106 FENE (MISCELLANEOUS) IMPLANT
DRAPE LAPAROTOMY 77X122 PED (DRAPES) ×3 IMPLANT
ELECT REM PT RETURN 9FT ADLT (ELECTROSURGICAL) ×3
ELECTRODE REM PT RTRN 9FT ADLT (ELECTROSURGICAL) ×1 IMPLANT
GAUZE PACKING 1/2X5YD (GAUZE/BANDAGES/DRESSINGS) ×3 IMPLANT
GLOVE BIO SURGEON STRL SZ 6.5 (GLOVE) ×8 IMPLANT
GLOVE BIO SURGEONS STRL SZ 6.5 (GLOVE) ×4
GLOVE BIOGEL PI IND STRL 6.5 (GLOVE) ×4 IMPLANT
GLOVE BIOGEL PI INDICATOR 6.5 (GLOVE) ×8
GOWN STRL REUS W/ TWL LRG LVL3 (GOWN DISPOSABLE) ×2 IMPLANT
GOWN STRL REUS W/TWL LRG LVL3 (GOWN DISPOSABLE) ×4
NEEDLE HYPO 22GX1.5 SAFETY (NEEDLE) ×3 IMPLANT
NS IRRIG 1000ML POUR BTL (IV SOLUTION) ×3 IMPLANT
PACK BASIN MINOR ARMC (MISCELLANEOUS) ×3 IMPLANT
SOL PREP PVP 2OZ (MISCELLANEOUS) ×6
SOLUTION PREP PVP 2OZ (MISCELLANEOUS) ×2 IMPLANT
SPONGE LAP 18X18 RF (DISPOSABLE) ×3 IMPLANT

## 2019-12-24 NOTE — Anesthesia Procedure Notes (Signed)
Procedure Name: LMA Insertion Date/Time: 12/24/2019 4:42 PM Performed by: Junious Silk, CRNA Pre-anesthesia Checklist: Patient identified, Emergency Drugs available, Suction available, Patient being monitored and Timeout performed Patient Re-evaluated:Patient Re-evaluated prior to induction Oxygen Delivery Method: Circle system utilized Preoxygenation: Pre-oxygenation with 100% oxygen Induction Type: IV induction Ventilation: Mask ventilation without difficulty LMA: LMA inserted LMA Size: 3.5 Tube type: Oral Number of attempts: 1 Placement Confirmation: positive ETCO2 and breath sounds checked- equal and bilateral Tube secured with: Tape Dental Injury: Teeth and Oropharynx as per pre-operative assessment

## 2019-12-24 NOTE — H&P (Signed)
PATIENT PROFILE: Kimberly Mosley is a 24 y.o. female who presents to the Clinic for consultation at the request of Dr. Jacqualine Code for evaluation of breast mastitis.  PCP:  None  HISTORY OF PRESENT ILLNESS: Kimberly Mosley reports her left breast started to became red and with a central dark area 4 days ago.  This has been gotten very painful.  The pain does not radiate to other part of the body.  There is no alleviating factor.  Awaiting factor is applying pressure to the area.  Patient went to the ED for evaluation of this findings.  At the ED she had ultrasound done showing a phlegmonous process under the area of concern but no localized fluid collection.  At that time she had white blood cell count of 10,000.  2 days later she came back to the ED because of worsening pain.  At that time with a second increase to 12,000.  She reports that the area is getting worse and more painful.  Today her pain score is 10 out of 10.   PROBLEM LIST:         Problem List  Date Reviewed: 10/01/2016         Noted   Rubella non-immune status, antepartum 11/02/2015   Overview    Needs PP MMR      Maternal iron deficiency anemia affecting pregnancy, antepartum, third trimester 11/02/2015   Overview    On OTC iron supplement       Supervision of high risk pregnancy due to social problems in third trimester 09/29/2015   Overview    24 y.o. G0P0000 at Unknown by  LMP c/w  week ultrasound Sex of baby and name:  BABY BOY! "Altamese Cabal"  Factors complicating this pregnancy  ? Late entry to prenatal care at [facility] at 15 weeks ? Rubella Non-immune ? Spontaneous painless vaginal bleeding at 21 weeks - hospital admission / resolved / posterior placenta   Screening results and needs: ? NOB:   MBT O+  Ab screen  Neg  Pap  HIV NR Hep B/RPR Neg/NR  Rubella Non immune   VZV Immune ? Aneuploidy:   First trimester (Informaseq, NT): Declined through the [facility]   Second trimester (AFP/tetra): Declined ? 28  weeks:   Blood consent: Signed 10/19/2015  Hgb: 10.9  Glucola: 107  Rhogam: N/A ? 36 weeks:   GBS: Neg   G/C: neg/neg  Hgb: 10.2 ? Last [place]:  08/22/15-variable presentation, post plac, normal anatomy  Immunization:   ? Flu in season - 11/01/14 declined ? Tdap at 27-36 weeks - Declined 10/19/15  Social: no  changes  Contraception Plan: Depo vs. Nexplanon  Feeding Plan: Breast          GENERAL REVIEW OF SYSTEMS:   General ROS: negative for - chills, fatigue, fever, weight gain or weight loss Allergy and Immunology ROS: negative for - hives  Hematological and Lymphatic ROS: negative for - bleeding problems or bruising, negative for palpable nodes Endocrine ROS: negative for - heat or cold intolerance, hair changes Respiratory ROS: negative for - cough, shortness of breath or wheezing Cardiovascular ROS: no chest pain or palpitations GI ROS: negative for nausea, vomiting, abdominal pain, diarrhea, constipation Musculoskeletal ROS: negative for - joint swelling or muscle pain Neurological ROS: negative for - confusion, syncope Dermatological ROS: negative for pruritus and rash.  Positive for erythema and necrosis of the left breast Psychiatric: negative for anxiety, depression, difficulty sleeping and memory loss  MEDICATIONS: Current Medications  No  current outpatient medications on file.   No current facility-administered medications for this visit.       ALLERGIES: Patient has no known allergies.  PAST MEDICAL HISTORY:     Past Medical History:  Diagnosis Date  . Anxiety, unspecified   . Ovarian cyst     PAST SURGICAL HISTORY: No past surgical history on file.   FAMILY HISTORY:      Family History  Problem Relation Age of Onset  . Asthma Mother   . Asthma Sister   . Myocardial Infarction (Heart attack) Maternal Grandmother      SOCIAL HISTORY: Social History          Socioeconomic History  . Marital status: Single     Spouse name: Not on file  . Number of children: Not on file  . Years of education: Not on file  . Highest education level: Not on file  Occupational History  . Not on file  Social Needs  . Financial resource strain: Not on file  . Food insecurity    Worry: Not on file    Inability: Not on file  . Transportation needs    Medical: Not on file    Non-medical: Not on file  Tobacco Use  . Smoking status: Former Research scientist (life sciences)  . Smokeless tobacco: Never Used  Substance and Sexual Activity  . Alcohol use: No  . Drug use: No  . Sexual activity: Yes    Partners: Male  Other Topics Concern  . Not on file  Social History Narrative  . Not on file      PHYSICAL EXAM:    Vitals:   12/24/19 0819  BP: 107/76  Pulse: 91   Body mass index is 29.05 kg/m. Weight: 74.4 kg (164 lb)   GENERAL: Alert, active, oriented x3  HEENT: Pupils equal reactive to light. Extraocular movements are intact. Sclera clear. Palpebral conjunctiva normal red color.Pharynx clear.  NECK: Supple with no palpable mass and no adenopathy.  LUNGS: Sound clear with no rales rhonchi or wheezes.  HEART: Regular rhythm S1 and S2 without murmur.  BREAST: Left breast with round area of central necrosis with large area of associated erythema.  Tender to palpation.  ABDOMEN: Soft and depressible, nontender with no palpable mass, no hepatomegaly.   EXTREMITIES: Well-developed well-nourished symmetrical with no dependent edema.  NEUROLOGICAL: Awake alert oriented, facial expression symmetrical, moving all extremities.  REVIEW OF DATA: I have reviewed the following data today:      No visits with results within 3 Month(s) from this visit.  Latest known visit with results is:  Appointment on 09/10/2016  Component Date Value  . Pregnancy Test (HCG), Qu* 09/10/2016 Negative      ASSESSMENT: Kimberly Mosley is a 24 y.o. female presenting for consultation for left breast mastitis.  Patient  currently on clindamycin for at least 4 days.  The patient reports that it is getting worse every day, more painful and with a necrotic central area.  There has been minimal drainage.  Ultrasound done at the ED shows a phlegmonous process.  I personally evaluated the images.  Even though there is no localized fluid collection due to the worsening despite having adequate antibiotic coverage I considered that the patient will benefit of admission for IV antibiotic therapy and debridement.  Patient understood the risk of surgery including large open wound, pain, deeper abscess, need of multiple interventions among others.  Mastitis, left, acute [N61.0]  PLAN: 1. Admission to the hospital for IV antibiotics and  left breast debridement.   Patient verbalized understanding, all questions were answered, and were agreeable with the plan outlined above.   Herbert Pun, MD  Electronically signed by Herbert Pun, MD

## 2019-12-24 NOTE — ED Triage Notes (Signed)
Patient ambulatory to triage with steady gait, without difficulty or distress noted, mask in place; pt seen recently for breast abscess; returns for worsening infections and pain; has appt at 8am with surgeon; currently taking antibiotics

## 2019-12-24 NOTE — Progress Notes (Signed)
Awaiting abx; requested x2 from pharmacy.

## 2019-12-24 NOTE — Interval H&P Note (Signed)
History and Physical Interval Note:  12/24/2019 3:30 PM  Kimberly Mosley  has presented today for surgery, with the diagnosis of Left Breast Masititis.  The various methods of treatment have been discussed with the patient and family. After consideration of risks, benefits and other options for treatment, the patient has consented to  Procedure(s): INCISION AND DRAINAGE ABSCESS (Left) as a surgical intervention.  The patient's history has been reviewed, patient examined, no change in status, stable for surgery.  I have reviewed the patient's chart and labs.  Left breast marked in the pre procedure area. Questions were answered to the patient's satisfaction.     Carolan Shiver

## 2019-12-24 NOTE — Anesthesia Preprocedure Evaluation (Signed)
Anesthesia Evaluation  Patient identified by MRN, date of birth, ID band Patient awake    Reviewed: Allergy & Precautions, H&P , NPO status , Patient's Chart, lab work & pertinent test results  History of Anesthesia Complications Negative for: history of anesthetic complications  Airway Mallampati: III  TM Distance: <3 FB Neck ROM: full    Dental  (+) Chipped, Poor Dentition   Pulmonary neg shortness of breath, Current Smoker and Patient abstained from smoking.,           Cardiovascular Exercise Tolerance: Good (-) angina(-) Past MI and (-) DOE negative cardio ROS       Neuro/Psych PSYCHIATRIC DISORDERS negative neurological ROS     GI/Hepatic negative GI ROS, Neg liver ROS, neg GERD  ,  Endo/Other  negative endocrine ROS  Renal/GU      Musculoskeletal   Abdominal   Peds  Hematology negative hematology ROS (+)   Anesthesia Other Findings Past Medical History: No date: Anemia No date: Anxiety     Comment:  situational No date: H/O Bell's palsy No date: Medical history non-contributory  Past Surgical History: No date: NO PAST SURGERIES  BMI    Body Mass Index: 31.89 kg/m      Reproductive/Obstetrics negative OB ROS                             Anesthesia Physical Anesthesia Plan  ASA: II  Anesthesia Plan: General LMA   Post-op Pain Management:    Induction: Intravenous  PONV Risk Score and Plan: Dexamethasone, Ondansetron, Midazolam and Treatment may vary due to age or medical condition  Airway Management Planned: LMA  Additional Equipment:   Intra-op Plan:   Post-operative Plan: Extubation in OR  Informed Consent: I have reviewed the patients History and Physical, chart, labs and discussed the procedure including the risks, benefits and alternatives for the proposed anesthesia with the patient or authorized representative who has indicated his/her understanding  and acceptance.     Dental Advisory Given  Plan Discussed with: Anesthesiologist, CRNA and Surgeon  Anesthesia Plan Comments: (Patient consented for risks of anesthesia including but not limited to:  - adverse reactions to medications - damage to teeth, lips or other oral mucosa - sore throat or hoarseness - Damage to heart, brain, lungs or loss of life  Patient voiced understanding.)        Anesthesia Quick Evaluation

## 2019-12-24 NOTE — Consult Note (Signed)
Pharmacy Antibiotic Note  Kimberly Mosley is a 24 y.o. female admitted on 12/24/2019 with cellulitis.  Pharmacy has been consulted for Vancomycin and Zosyn dosing.  Plan: 1) Vancomycin 2250 mg IV Q 24 hrs. Goal AUC 400-550. Expected AUC: 489.7 Expected Css: 8.4 SCr used: 0.8(actual 0.74)  2) Zosyn 3.375g Q8 hours(infused over 4 hours)  Height: 5\' 3"  (160 cm) Weight: 180 lb (81.6 kg) IBW/kg (Calculated) : 52.4  Temp (24hrs), Avg:98.3 F (36.8 C), Min:98.2 F (36.8 C), Max:98.4 F (36.9 C)  Recent Labs  Lab 12/24/19 0056  WBC 12.5*  CREATININE 0.74  LATICACIDVEN 1.3    Estimated Creatinine Clearance: 108.8 mL/min (by C-G formula based on SCr of 0.74 mg/dL).    No Known Allergies  Antimicrobials this admission: Vancomycin 3/4 >>  Zosyn 3/4 >>    Thank you for allowing pharmacy to be a part of this patient's care.  02/23/20 12/24/2019 2:33 PM

## 2019-12-24 NOTE — Anesthesia Postprocedure Evaluation (Signed)
Anesthesia Post Note  Patient: Kimberly Mosley  Procedure(s) Performed: INCISION AND DRAINAGE ABSCESS (Left )  Patient location during evaluation: PACU Anesthesia Type: General Level of consciousness: awake and alert Pain management: pain level controlled Vital Signs Assessment: post-procedure vital signs reviewed and stable Respiratory status: spontaneous breathing, nonlabored ventilation, respiratory function stable and patient connected to nasal cannula oxygen Cardiovascular status: blood pressure returned to baseline and stable Postop Assessment: no apparent nausea or vomiting Anesthetic complications: no     Last Vitals:  Vitals:   12/24/19 1746 12/24/19 1850  BP:  107/78  Pulse: 61 73  Resp: 11 16  Temp: (!) 36.3 C 36.7 C  SpO2: 97% 97%    Last Pain:  Vitals:   12/24/19 1850  TempSrc: Oral  PainSc:                  Cleda Mccreedy Mariavictoria Nottingham

## 2019-12-24 NOTE — Op Note (Signed)
Preoperative diagnosis: Left breast abscess   Postoperative diagnosis: Left breast  abscess  Procedure: Mastotomy and exploration of left breast for drainage of abscess.  Anesthesia: General  Surgeon: Dr. Hazle Quant, MD  Wound Classification: Contaminated  Indications: Patient is a 24 y.o. female  presented with an abscess on the of the left breast that has not responded to antibiotic therapy.   Findings:  1. Abscess on the left breast, deep inferior portion.  2. Moderate amount of purulent secretions drained and tissue cultured 3. Adequate hemostasis.   Description of procedure: The patient was placed in the supine position and general anesthesia was induced. The left breast was prepped and draped in the usual sterile fashion. A timeout was completed verifying correct patient, procedure, site, positioning, and implant(s) and/or special equipment prior to beginning this procedure.  A skin incision was made on the inferior portion of the left breast. Dissection was carried down until infected tissue and pus was identified. The wound was opened and purulent secretions was drained. With a hemostat blunt dissection of septas was done to be able to drain the abscess completely. The cavity was irrigated. The cavity flushed with saline until clear saline was aspirated. The wound was packed with an bethadine packing and wound left opened. Sterile dressing left in place.   The patient tolerated the procedure well and was taken to the postanesthesia care unit in satisfactory condition.   Specimen: Tissue for culture  Complications: None  Estimated Blood Loss: 5 mL

## 2019-12-24 NOTE — Discharge Instructions (Addendum)
As we discussed, your work-up was generally reassuring tonight, and your vital signs were normal.  Your best plan is to see Dr. Maia Plan in a few hours as scheduled and he can direct additional care or treatment as needed.  I recommend you continue to take your prescribed antibiotics (clindamycin) as previously prescribed and scheduled.  I have written you a prescription for stronger pain medicine but I recommend you wait to fill it until after you see Dr. Maia Plan to see if he has a different plan.  Please follow-up at 8 AM as scheduled.  If you develop new or worsening symptoms at any point, please return to the emergency department.

## 2019-12-24 NOTE — ED Provider Notes (Signed)
[facility] Emergency Department Provider Note  ____________________________________________   First MD Initiated Contact with Patient 12/24/19 0101     (approximate)  I have reviewed the triage vital signs and the nursing notes.   HISTORY  Chief Complaint Abscess    HPI Kimberly Mosley is a 24 y.o. female who presents for evaluation of about 4 days of pain, redness, and some drainage of a large area underneath her left nipple.  She was seen in the emergency department about 2 days ago by Dr. Jacqualine Code and has an appointment in approximately 6 hours with Dr. Peyton Najjar with general surgery.  She had an ultrasound of the breast that demonstrated a small area of probable phlegmon but no drainable fluid collections with some cellulitic changes.  She was started on clindamycin which she says she has been taking.  She presents tonight for worsening pain and some changes of the site.  She said that there was a central black area before that has fallen off and now she has some "holes" that are draining clear fluid.  The total area involved has not gotten significantly bigger, although it may have expanded a slight amount.  She denies fever/chills, chest pain, shortness of breath, nausea, vomiting, generalized weakness, or any other systemic symptoms.  She describes the symptoms as severe and nothing in particular seems to be making it better or worse.  She was concerned she would not be able to make it until the morning due to the pain.  She drove herself here to the ED tonight.         Past Medical History:  Diagnosis Date  . Anemia   . Anxiety    situational  . H/O Bell's palsy   . Medical history non-contributory     Patient Active Problem List   Diagnosis Date Noted  . Class 1 obesity due to excess calories without serious comorbidity with body mass index (BMI) of 32.0 to 32.9 in adult 06/05/2018  . Panic attack as reaction to stress 10/29/2017  . Anxiety and  depression 05/01/2017  . Vaginal bleeding in pregnancy 09/13/2015    Past Surgical History:  Procedure Laterality Date  . NO PAST SURGERIES      Prior to Admission medications   Medication Sig Start Date End Date Taking? Authorizing Provider  clindamycin (CLEOCIN) 300 MG capsule Take 1 capsule (300 mg total) by mouth 3 (three) times daily. 12/22/19   Delman Kitten, MD  etonogestrel (NEXPLANON) 68 MG IMPL implant 1 each by Subdermal route once.    [provider]  FLUoxetine (PROZAC) 20 MG capsule Take 1 capsule (20 mg total) by mouth daily. Patient not taking: Reported on 06/05/2018 10/29/17 11/26/19  Mikey College, NP    Allergies Patient has no known allergies.  Family History  Problem Relation Age of Onset  . Depression Father   . Bipolar disorder Father   . Healthy Mother   . Healthy Sister   . Healthy Brother   . Cancer Maternal Uncle        pancreatic cancer  . Heart disease Maternal Grandmother   . Stroke Maternal Grandmother   . Healthy Maternal Grandfather   . Breast cancer Neg Hx   . Colon cancer Neg Hx   . Ovarian cancer Neg Hx     Social History Social History   Tobacco Use  . Smoking status: Current Every Day Smoker    Packs/day: 0.50    Years: 7.00  Pack years: 3.50    Types: Cigarettes  . Smokeless tobacco: Never Used  Substance Use Topics  . Alcohol use: Not Currently    Alcohol/week: 1.0 standard drinks    Types: 1 Cans of beer per week    Comment: Last ETOH use 2 days ago.  . Drug use: No    Review of Systems Constitutional: No fever/chills Eyes: No visual changes. ENT: No sore throat. Cardiovascular: Denies chest pain. Respiratory: Denies shortness of breath. Gastrointestinal: No abdominal pain.  No nausea, no vomiting.  No diarrhea.  No constipation. Genitourinary: Negative for dysuria. Musculoskeletal: Negative for neck pain.  Negative for back pain. Integumentary: Worsening skin changes of the left breast. Neurological:  Negative for headaches, focal weakness or numbness.   ____________________________________________   PHYSICAL EXAM:  VITAL SIGNS: ED Triage Vitals [12/24/19 0041]  Enc Vitals Group     BP (!) 144/87     Pulse Rate 91     Resp 16     Temp 98.4 F (36.9 C)     Temp Source Oral     SpO2 96 %     Weight 81.6 kg (180 lb)     Height 1.6 m (5\' 3" )     Head Circumference      Peak Flow      Pain Score 10     Pain Loc      Pain Edu?      Excl. in GC?     Constitutional: Alert and oriented.  Does not appear to be in acute distress at this time. Eyes: Conjunctivae are normal.  Head: Atraumatic. Nose: No congestion/rhinnorhea. Mouth/Throat: Patient is wearing a mask. Neck: No stridor.  No meningeal signs.   Cardiovascular: Normal rate, regular rhythm. Good peripheral circulation. Grossly normal heart sounds. Respiratory: Normal respiratory effort.  No retractions. Gastrointestinal: Soft and nontender. No distention.  Musculoskeletal: No lower extremity tenderness nor edema. No gross deformities of extremities. Neurologic:  Normal speech and language. No gross focal neurologic deficits are appreciated.  Psychiatric: Mood and affect are normal. Speech and behavior are normal. Skin: Left breast examined with nurse Gracie.  No abnormalities identified superior to the nipple and areola.  Below the areola involving most of the inferior aspect of the breast is a area of erythema that is approximately 9 cm in diameter and about 7 cm in height.  It feels slightly warm to the touch and is mildly tender.  There is a central area of ulceration, at least 3 distinct ulcerations, which previously was covered with a black eschar based on the patient's report and the photos she had on her phone as well as the previous note from Dr. .  This area is now open but not draining any fluid.  There is no significant induration and no fluctuance to suggest an underlying drainable fluid collection or  extensive area of inflammation.  See photo below:     ____________________________________________   LABS (all labs ordered are listed, but only abnormal results are displayed)  Labs Reviewed  BASIC METABOLIC PANEL - Abnormal; Notable for the following components:      Result Value   Glucose, Bld 102 (*)    All other components within normal limits  CBC WITH DIFFERENTIAL/PLATELET - Abnormal; Notable for the following components:   WBC 12.5 (*)    Neutro Abs 7.8 (*)    All other components within normal limits  LACTIC ACID, PLASMA  PROCALCITONIN   ____________________________________________  EKG  No indication for  EKG ____________________________________________  RADIOLOGY Marylou Mccoy, personally viewed and evaluated these images (plain radiographs) as part of my medical decision making, as well as reviewing the written report by the radiologist.  ED MD interpretation: No indication for emergent imaging  Official radiology report(s): No results found.  ____________________________________________   PROCEDURES   Procedure(s) performed (including Critical Care):  Procedures   ____________________________________________   INITIAL IMPRESSION / MDM / ASSESSMENT AND PLAN / ED COURSE  As part of my medical decision making, I reviewed the following data within the electronic MEDICAL RECORD NUMBER Nursing notes reviewed and incorporated, Labs reviewed , Old chart reviewed and Notes from prior ED visits   Differential diagnosis includes, but is not limited to, breast cellulitis/abscess, breast cancer, skin cancer.  The patient is essentially stable from her visit a few days ago.  The area of erythema may have expanded somewhat but she has no systemic symptoms.  She has no tachycardia with a resting heart rate in the 80s and she is afebrile.  She has a very mild leukocytosis of 12.5 and a normal lactic acid.  It appears that the central area of necrosis or eschar has come  off and now she has a few small open wounds but without any purulence or draining fluid.  She confirmed that she has an appointment with Dr. Maia Plan with general surgery in a few hours.  Given no systemic symptoms, the fact that she is on clindamycin, and that she has very close follow-up available to her, I do not feel that there is an indication for emergent intervention tonight nor repeat imaging at this time.  When Dr. Maia Plan sees her in a few hours he can order additional imaging at the breast imaging center when additional resources are available during daytime hours, order biopsies if he feels it is necessary, etc.  Rather than change her antibiotic treatment now before she sees a specialist, I will encourage her to continue on the same antibiotics at least until she sees him in follow-up.  I went over this plan with her and explained that by seeing him in a few hours as scheduled she will likely receive the same or even more efficient care than if I were to keep her in the hospital and she understands and agrees with the plan.  I gave her a dose of Toradol 15 mg IV for pain given that she drove herself and cannot receive narcotics.  I gave her a prescription for Norco but suggested that she wait until after she sees Dr. Maia Plan to fill the prescription in case he has a different plan.  I gave my usual return precautions.          ____________________________________________  FINAL CLINICAL IMPRESSION(S) / ED DIAGNOSES  Final diagnoses:  Cellulitis of left breast  Breast skin changes  Ulcer of skin of breast     MEDICATIONS GIVEN DURING THIS VISIT:  Medications  ketorolac (TORADOL) 30 MG/ML injection 15 mg (15 mg Intravenous Given 12/24/19 0212)     ED Discharge Orders    None      *Please note:  Kimberly Mosley was evaluated in Emergency Department on 12/24/2019 for the symptoms described in the history of present illness. She was evaluated in the context of the global COVID-19  pandemic, which necessitated consideration that the patient might be at risk for infection with the SARS-CoV-2 virus that causes COVID-19. Institutional protocols and algorithms that pertain to the evaluation of patients at risk for COVID-19  are in a state of rapid change based on information released by regulatory bodies including the CDC and federal and state organizations. These policies and algorithms were followed during the patient's care in the ED.  Some ED evaluations and interventions may be delayed as a result of limited staffing during the pandemic.*  Note:  This document was prepared using Dragon voice recognition software and may include unintentional dictation errors.   Loleta Rose, MD 12/24/19 [phone]

## 2019-12-24 NOTE — Transfer of Care (Signed)
Immediate Anesthesia Transfer of Care Note  Patient: Kimberly Mosley  Procedure(s) Performed: INCISION AND DRAINAGE ABSCESS (Left )  Patient Location: PACU  Anesthesia Type:General  Level of Consciousness: sedated  Airway & Oxygen Therapy: Patient Spontanous Breathing and Patient connected to face mask oxygen  Post-op Assessment: Report given to RN and Post -op Vital signs reviewed and stable  Post vital signs: Reviewed and stable  Last Vitals:  Vitals Value Taken Time  BP    Temp    Pulse 57 12/24/19 1704  Resp 13 12/24/19 1704  SpO2 99 % 12/24/19 1704  Vitals shown include unvalidated device data.  Last Pain:  Vitals:   12/24/19 1528  TempSrc: Temporal  PainSc: 4          Complications: No apparent anesthesia complications

## 2019-12-25 LAB — BASIC METABOLIC PANEL
Anion gap: 7 (ref 5–15)
BUN: 10 mg/dL (ref 6–20)
CO2: 22 mmol/L (ref 22–32)
Calcium: 9.1 mg/dL (ref 8.9–10.3)
Chloride: 109 mmol/L (ref 98–111)
Creatinine, Ser: 0.59 mg/dL (ref 0.44–1.00)
GFR calc Af Amer: >60 mL/min (ref >60)
GFR calc non Af Amer: >60 mL/min (ref >60)
Glucose, Bld: 119 mg/dL — ABNORMAL HIGH (ref 70–99)
Potassium: 4.7 mmol/L (ref 3.5–5.1)
Sodium: 138 mmol/L (ref 135–145)

## 2019-12-25 LAB — CBC
HCT: 44.4 % (ref 36.0–46.0)
Hemoglobin: 14.5 g/dL (ref 12.0–15.0)
MCH: 29.3 pg (ref 26.0–34.0)
MCHC: 32.7 g/dL (ref 30.0–36.0)
MCV: 89.7 fL (ref 80.0–100.0)
Platelets: 278 10*3/uL (ref 150–400)
RBC: 4.95 MIL/uL (ref 3.87–5.11)
RDW: 13.1 % (ref 11.5–15.5)
WBC: 12.7 10*3/uL — ABNORMAL HIGH (ref 4.0–10.5)
nRBC: 0 % (ref 0.0–0.2)

## 2019-12-25 MED ORDER — VANCOMYCIN HCL 1500 MG/300ML IV SOLN
1500.0000 mg | Freq: Two times a day (BID) | INTRAVENOUS | Status: DC
  Administered 2019-12-25 – 2019-12-27 (×4): 1500 mg via INTRAVENOUS
  Filled 2019-12-25 (×8): qty 300

## 2019-12-25 MED ORDER — VANCOMYCIN HCL IN DEXTROSE 750-5 MG/150ML-% IV SOLN
750.0000 mg | Freq: Two times a day (BID) | INTRAVENOUS | Status: DC
  Administered 2019-12-25: 750 mg via INTRAVENOUS
  Filled 2019-12-25 (×2): qty 150

## 2019-12-25 MED ORDER — SODIUM CHLORIDE 0.9 % IV SOLN
3.0000 g | Freq: Four times a day (QID) | INTRAVENOUS | Status: DC
  Administered 2019-12-25 – 2019-12-27 (×9): 3 g via INTRAVENOUS
  Filled 2019-12-25: qty 8
  Filled 2019-12-25: qty 3
  Filled 2019-12-25: qty 8
  Filled 2019-12-25: qty 3
  Filled 2019-12-25 (×3): qty 8
  Filled 2019-12-25 (×5): qty 3
  Filled 2019-12-25 (×2): qty 8

## 2019-12-25 NOTE — Plan of Care (Signed)
Continuing with plan of care. 

## 2019-12-25 NOTE — Progress Notes (Signed)
   12/25/19 1045  Clinical Encounter Type  Visited With Patient  Visit Type Initial  Referral From Nurse  Consult/Referral To Chaplain  Chaplain visited with patient in response to an OR. When Chaplain arrived and asked patient what she needed, patient responded as if she did not make a request. Patient said that she is feeling much better since surgery. Chaplain offered pastoral presence, empathy, and prayer.

## 2019-12-25 NOTE — Progress Notes (Signed)
SURGICAL PROGRESS NOTE   Hospital Day(s): 1.   Post op day(s): 1 Day Post-Op.   Interval History: Patient seen and examined, no acute events or new complaints overnight. Patient reports feeling better today.  She does endorses some pain on the left lower breast.  She denies any fever or chills.  There is no pain radiation.  There is no aggravating factor.  Alleviating factor is current pain medications.  Vital signs in last 24 hours: [min-max] current  Temp:  [97.4 F (36.3 C)-98.4 F (36.9 C)] 98.4 F (36.9 C) (03/05 1152) Pulse Rate:  [58-81] 79 (03/05 1152) Resp:  [11-25] 20 (03/05 0523) BP: (99-112)/(50-81) 109/66 (03/05 1152) SpO2:  [96 %-99 %] 97 % (03/05 1152)     Height: 5\' 3"  (160 cm) Weight: 81.6 kg BMI (Calculated): 31.89   Physical Exam:  Constitutional: alert, cooperative and no distress  Respiratory: breathing non-labored at rest  Cardiovascular: regular rate and sinus rhythm  Gastrointestinal: soft, non-tender, and non-distended Breast: Left breast with open wound, associated cellulitis.  There is no purulent drainage.  There is no necrotic tissue.  Labs:  CBC Latest Ref Rng & Units 12/25/2019 12/24/2019 10/13/2018  WBC 4.0 - 10.5 K/uL 12.7(H) 12.5(H) 10.8(H)  Hemoglobin 12.0 - 15.0 g/dL 10/15/2018 34.7 42.5  Hematocrit 36.0 - 46.0 % 44.4 42.3 43.3  Platelets 150 - 400 K/uL 278 272 286   CMP Latest Ref Rng & Units 12/25/2019 12/24/2019 10/13/2018  Glucose 70 - 99 mg/dL 10/15/2018) 387(F) 95  BUN 6 - 20 mg/dL 10 11 9   Creatinine 0.44 - 1.00 mg/dL 643(P 2.95  Sodium 135 - 145 mmol/L 138 138 138  Potassium 3.5 - 5.1 mmol/L 4.7 3.9 3.6  Chloride 98 - 111 mmol/L 109 105 106  CO2 22 - 32 mmol/L 22 23 25   Calcium 8.9 - 10.3 mg/dL 9.1 9.3 9.3  Total Protein 6.4 - 8.2 g/dL - - -  Total Bilirubin 0.2 - 1.0 mg/dL - - -  Alkaline Phos Unit/L - - -  AST 15 - 37 Unit/L - - -  ALT U/L - - -    Imaging studies: No new pertinent imaging studies   Assessment/Plan:  24 y.o.  female with left breast mastitis 1 Day Post-Op s/p mastectomy with deep exploration of abscess.  Patient without any clinical deterioration.  There is minimal improvement of the cellulitis around the wound.  There is no improvement of white blood cell count.  Appreciated ID recommendation of antibiotic therapy.  We will continue with Unasyn and vancomycin until culture results are out.  I personally did the local care.   4.16, MD

## 2019-12-25 NOTE — Progress Notes (Signed)
Pharmacy - Brief Note (vancoycin dosing)  Based on patient's age and renal function will adjust vancomycin to 1500mg  IV q12h. Since has borderline BMI for choosing Vd, prefer using 0.72 L/kg.   For estimated AUC = 494, Peak = 36, Trough = 12 - follow renal function and culture results   , PharmD, BCPS.   Work Cell: [phone] 12/25/2019 2:58 PM

## 2019-12-25 NOTE — Consult Note (Signed)
Pharmacy Antibiotic Note  Kimberly Mosley is a 24 y.o. female admitted on 12/24/2019 with cellulitis.  Pharmacy has been consulted for Vancomycin and Zosyn dosing.  Wound culture: GPC  Plan: 1) Vancomycin 750 mg IV Q 12 hrs. Goal AUC 400-550. Expected AUC: 470.1 Expected Css: 12.8 SCr used: 0.8(actual 0.74) BMI ~32 (BMI > 30 use Vd 0.5 L/kg)  2) Zosyn 3.375g Q8 hours(infused over 4 hours) for now. Will follow-up with MD on utility.  Height: 5\' 3"  (160 cm) Weight: 180 lb (81.6 kg) IBW/kg (Calculated) : 52.4  Temp (24hrs), Avg:97.9 F (36.6 C), Min:97.4 F (36.3 C), Max:98.5 F (36.9 C)  Recent Labs  Lab 12/24/19 0056  WBC 12.5*  CREATININE 0.74  LATICACIDVEN 1.3    Estimated Creatinine Clearance: 108.8 mL/min (by C-G formula based on SCr of 0.74 mg/dL).    No Known Allergies  Antimicrobials this admission: Vancomycin 3/4 >>  Zosyn 3/4 >>    Thank you for allowing pharmacy to be a part of this patient's care.  02/23/20 12/25/2019 7:58 AM

## 2019-12-25 NOTE — Consult Note (Signed)
Infectious Disease     Reason for Consult: Mastitis Referring Physician: Dr. Peyton Najjar  Date of Admission:  12/24/2019   Active Problems:   Acute mastitis of left breast   HPI: Kimberly Mosley is a 24 y.o. female admitted 3/4 through ED with 4 days of L breast pain, redness and drainage. Iniitally seen 3/2 and started on clindamycin but site worsened and began draining more. She was not have fevers chills. On admit wbc was 12.6. US breast showed an area of marked skin thickening measuring approximately 9 mm. Within this thickened area of skin, there is a hypoechoic mass measuring 1.3 x 0.6 x 1.2 cm. No masses, or fluid collections are seen deep to the skin surface.  Dr Tildon Husky performed surgery 3/4 - with findings of Deep abscess of L breast.Mod purulent secretions.  CX are pending., GS with GPC. MRSA PCR neg.   Past Medical History:  Diagnosis Date  . Anemia   . Anxiety    situational  . H/O Bell's palsy   . Medical history non-contributory    Past Surgical History:  Procedure Laterality Date  . INCISION AND DRAINAGE ABSCESS Left 12/24/2019   Procedure: INCISION AND DRAINAGE ABSCESS;  Surgeon: Herbert Pun, MD;  Location: [facility] ORS;  Service: General;  Laterality: Left;  . NO PAST SURGERIES     Social History   Tobacco Use  . Smoking status: Current Every Day Smoker    Packs/day: 0.50    Years: 7.00    Pack years: 3.50    Types: Cigarettes  . Smokeless tobacco: Never Used  Substance Use Topics  . Alcohol use: Yes    Alcohol/week: 2.0 standard drinks    Types: 2 Cans of beer per week    Comment: Last ETOH use 2 days ago.  . Drug use: No   Family History  Problem Relation Age of Onset  . Depression Father   . Bipolar disorder Father   . Healthy Mother   . Healthy Sister   . Healthy Brother   . Cancer Maternal Uncle        pancreatic cancer  . Heart disease Maternal Grandmother   . Stroke Maternal Grandmother   . Healthy Maternal Grandfather   . Breast cancer  Neg Hx   . Colon cancer Neg Hx   . Ovarian cancer Neg Hx     Allergies: No Known Allergies  Current antibiotics: Antibiotics Given (last 72 hours)    Date/Time Action Medication Dose Rate   12/24/19 1331 New Bag/Given   piperacillin-tazobactam (ZOSYN) IVPB 3.375 g 3.375 g 12.5 mL/hr   12/24/19 1822 New Bag/Given   vancomycin (VANCOREADY) IVPB 2000 mg/400 mL 2,000 mg 200 mL/hr   12/24/19 2250 New Bag/Given   piperacillin-tazobactam (ZOSYN) IVPB 3.375 g 3.375 g 12.5 mL/hr   12/25/19 0458 New Bag/Given   piperacillin-tazobactam (ZOSYN) IVPB 3.375 g 3.375 g 12.5 mL/hr   12/25/19 1037 New Bag/Given   vancomycin (VANCOCIN) IVPB 750 mg/150 ml premix 750 mg 150 mL/hr      MEDICATIONS: . enoxaparin (LOVENOX) injection  40 mg Subcutaneous Q24H    Review of Systems - 11 systems reviewed and negative per HPI   OBJECTIVE: Temp:  [97.4 F (36.3 C)-98.5 F (36.9 C)] 98.4 F (36.9 C) (03/05 1152) Pulse Rate:  [58-81] 79 (03/05 1152) Resp:  [11-25] 20 (03/05 0523) BP: (99-117)/(50-82) 109/66 (03/05 1152) SpO2:  [96 %-99 %] 97 % (03/05 1152) Weight:  [81.6 kg] 81.6 kg (03/04 1528) Physical Exam  Constitutional:  oriented to person, place, and time. appears well-developed and well-nourished. No distress.  HENT: [place]/AT, PERRLA, no scleral icterus Mouth/Throat: Oropharynx is clear and moist. No oropharyngeal exudate.  Cardiovascular: Normal rate, regular rhythm and normal heart sounds. Exam reveals no gallop and no friction rub.  Pulmonary/Chest: Effort normal and breath sounds normal. No respiratory distress.  has no wheezes.  Neck = supple, no nuchal rigidity Abdominal: Soft. Bowel sounds are normal.  exhibits no distension. There is no tenderness. L breast with post op dressing, there is packing in the abscess cavity which is dry and not removed. Mild induration surrounding the cavity. Mild ttp  Lymphadenopathy: no cervical adenopathy. No axillary adenopathy Neurological: alert and  oriented to person, place, and time.  Skin: no rash - see breast above Psychiatric: a normal mood and affect.  behavior is normal.    LABS: Results for orders placed or performed during the hospital encounter of 12/24/19 (from the past 48 hour(s))  Respiratory Panel by RT PCR (Flu A&B, Covid) - Nasopharyngeal Swab     Status: None   Collection Time: 12/24/19 11:03 AM   Specimen: Nasopharyngeal Swab  Result Value Ref Range   SARS Coronavirus 2 by RT PCR NEGATIVE NEGATIVE    Comment: (NOTE) SARS-CoV-2 target nucleic acids are NOT DETECTED. The SARS-CoV-2 RNA is generally detectable in upper respiratoy specimens during the acute phase of infection. The lowest concentration of SARS-CoV-2 viral copies this assay can detect is 131 copies/mL. A negative result does not preclude SARS-Cov-2 infection and should not be used as the sole basis for treatment or other patient management decisions. A negative result may occur with  improper specimen collection/handling, submission of specimen other than nasopharyngeal swab, presence of viral mutation(s) within the areas targeted by this assay, and inadequate number of viral copies (<131 copies/mL). A negative result must be combined with clinical observations, patient history, and epidemiological information. The expected result is Negative. Fact Sheet for Patients:  PinkCheek.be Fact Sheet for Healthcare Providers:  GravelBags.it This test is not yet ap proved or cleared by the [place] FDA and  has been authorized for detection and/or diagnosis of SARS-CoV-2 by FDA under an Emergency Use Authorization (EUA). This EUA will remain  in effect (meaning this test can be used) for the duration of the COVID-19 declaration under Section 564(b)(1) of the Act, 21 U.S.C. section 360bbb-3(b)(1), unless the authorization is terminated or revoked sooner.    Influenza A by PCR NEGATIVE NEGATIVE    Influenza B by PCR NEGATIVE NEGATIVE    Comment: (NOTE) The Xpert Xpress SARS-CoV-2/FLU/RSV assay is intended as an aid in  the diagnosis of influenza from Nasopharyngeal swab specimens and  should not be used as a sole basis for treatment. Nasal washings and  aspirates are unacceptable for Xpert Xpress SARS-CoV-2/FLU/RSV  testing. Fact Sheet for Patients: PinkCheek.be Fact Sheet for Healthcare Providers: GravelBags.it This test is not yet approved or cleared by the [place] FDA and  has been authorized for detection and/or diagnosis of SARS-CoV-2 by  FDA under an Emergency Use Authorization (EUA). This EUA will remain  in effect (meaning this test can be used) for the duration of the  Covid-19 declaration under Section 564(b)(1) of the Act, 21  U.S.C. section 360bbb-3(b)(1), unless the authorization is  terminated or revoked. Performed at [facility], [address], [place], [place] [address]   Surgical pcr screen     Status: None   Collection Time: 12/24/19 11:50 AM  Result Value  Ref Range   MRSA, PCR NEGATIVE NEGATIVE   Staphylococcus aureus NEGATIVE NEGATIVE    Comment: (NOTE) The Xpert SA Assay (FDA approved for NASAL specimens in patients 26 years of age and older), is one component of a comprehensive surveillance program. It is not intended to diagnose infection nor to guide or monitor treatment. Performed at [facility], [address] [place], [place] [address]   Pregnancy, urine     Status: None   Collection Time: 12/24/19  3:12 PM  Result Value Ref Range   Preg Test, Ur NEGATIVE NEGATIVE    Comment: Performed at [facility], [address] [place], [place] [address]  Aerobic/Anaerobic Culture (surgical/deep wound)     Status: None (Preliminary result)   Collection Time: 12/24/19  4:41 PM   Specimen: PATH Breast other; Tissue  Result Value Ref Range   Specimen  Description      WOUND Performed at [facility], [address], [place], [place] [address]    Special Requests      NONE Performed at [facility], [place], [place] [address]    Gram Stain      FEW WBC PRESENT, PREDOMINANTLY PMN RARE GRAM POSITIVE COCCI    Culture      NO GROWTH < 24 HOURS Performed at [facility], [place] [address] [place], [place] [address]    Report Status PENDING   Basic metabolic panel     Status: Abnormal   Collection Time: 12/25/19  7:34 AM  Result Value Ref Range   Sodium 138 135 - 145 mmol/L   Potassium 4.7 3.5 - 5.1 mmol/L   Chloride 109 98 - 111 mmol/L   CO2 22 22 - 32 mmol/L   Glucose, Bld 119 (H) 70 - 99 mg/dL    Comment: Glucose reference range applies only to samples taken after fasting for at least 8 hours.   BUN 10 6 - 20 mg/dL   Creatinine, Ser 0.59 0.44 - 1.00 mg/dL   Calcium 9.1 8.9 - 10.3 mg/dL   GFR calc non Af Amer >60 >60 mL/min   GFR calc Af Amer >60 >60 mL/min   Anion gap 7 5 - 15    Comment: Performed at [facility], [address] [place], [place] [address]  CBC     Status: Abnormal   Collection Time: 12/25/19  7:34 AM  Result Value Ref Range   WBC 12.7 (H) 4.0 - 10.5 K/uL   RBC 4.95 3.87 - 5.11 MIL/uL   Hemoglobin 14.5 12.0 - 15.0 g/dL   HCT 44.4 36.0 - 46.0 %   MCV 89.7 80.0 - 100.0 fL   MCH 29.3 26.0 - 34.0 pg   MCHC 32.7 30.0 - 36.0 g/dL   RDW 13.1 11.5 - 15.5 %   Platelets 278 150 - 400 K/uL   nRBC 0.0 0.0 - 0.2 %    Comment: Performed at [facility], [address] [place],  [address]   No components found for: ESR, C REACTIVE PROTEIN MICRO: Recent Results (from the past 720 hour(s))  SARS CORONAVIRUS 2 (TAT 6-24 HRS) Nasopharyngeal Nasopharyngeal Swab     Status: None   Collection Time: 11/26/19 12:26 PM   Specimen: Nasopharyngeal Swab  Result Value Ref Range Status   SARS Coronavirus 2 NEGATIVE NEGATIVE Final    Comment:  (NOTE) SARS-CoV-2 target nucleic acids are NOT DETECTED. The SARS-CoV-2 RNA is generally detectable in upper and lower  respiratory specimens during the acute phase of infection. Negative results do not preclude SARS-CoV-2 infection, do not rule out co-infections with other pathogens, and should not be used as the sole basis for treatment or other patient management decisions. Negative results must be combined with clinical observations, patient history, and epidemiological information. The expected result is Negative. Fact Sheet for Patients: SugarRoll.be Fact Sheet for Healthcare Providers: https://www.woods-mathews.com/ This test is not yet approved or cleared by the [place] FDA and  has been authorized for detection and/or diagnosis of SARS-CoV-2 by FDA under an Emergency Use Authorization (EUA). This EUA will remain  in effect (meaning this test can be used) for the duration of the COVID-19 declaration under Section 56 4(b)(1) of the Act, 21 U.S.C. section 360bbb-3(b)(1), unless the authorization is terminated or revoked sooner. Performed at [facility], [place] [address] [place], [place] [address]   Respiratory Panel by RT PCR (Flu A&B, Covid) - Nasopharyngeal Swab     Status: None   Collection Time: 12/24/19 11:03 AM   Specimen: Nasopharyngeal Swab  Result Value Ref Range Status   SARS Coronavirus 2 by RT PCR NEGATIVE NEGATIVE Final    Comment: (NOTE) SARS-CoV-2 target nucleic acids are NOT DETECTED. The SARS-CoV-2 RNA is generally detectable in upper respiratoy specimens during the acute phase of infection. The lowest concentration of SARS-CoV-2 viral copies this assay can detect is 131 copies/mL. A negative result does not preclude SARS-Cov-2 infection and should not be used as the sole basis for treatment or other patient management decisions. A negative result may occur with  improper specimen collection/handling,  submission of specimen other than nasopharyngeal swab, presence of viral mutation(s) within the areas targeted by this assay, and inadequate number of viral copies (<131 copies/mL). A negative result must be combined with clinical observations, patient history, and epidemiological information. The expected result is Negative. Fact Sheet for Patients:  PinkCheek.be Fact Sheet for Healthcare Providers:  GravelBags.it This test is not yet ap proved or cleared by the [place] FDA and  has been authorized for detection and/or diagnosis of SARS-CoV-2 by FDA under an Emergency Use Authorization (EUA). This EUA will remain  in effect (meaning this test can be used) for the duration of the COVID-19 declaration under Section 564(b)(1) of the Act, 21 U.S.C. section 360bbb-3(b)(1), unless the authorization is terminated or revoked sooner.    Influenza A by PCR NEGATIVE NEGATIVE Final   Influenza B by PCR NEGATIVE NEGATIVE Final    Comment: (NOTE) The Xpert Xpress SARS-CoV-2/FLU/RSV assay is intended as an aid in  the diagnosis of influenza from Nasopharyngeal swab specimens and  should not be used as a sole basis for treatment. Nasal washings and  aspirates are unacceptable for Xpert Xpress SARS-CoV-2/FLU/RSV  testing. Fact Sheet for Patients: PinkCheek.be Fact Sheet for Healthcare Providers: GravelBags.it This test is not yet approved or cleared by the [place] FDA and  has been authorized for detection and/or diagnosis of SARS-CoV-2 by  FDA under an Emergency Use Authorization (EUA). This EUA will remain  in effect (meaning this test can be used) for the duration of the  Covid-19 declaration under Section 564(b)(1) of the Act, 21  U.S.C. section 360bbb-3(b)(1), unless the authorization is  terminated or revoked. Performed at [facility], [address], [place], [place] [address]   Surgical pcr screen     Status: None   Collection Time: 12/24/19 11:50 AM  Result Value Ref Range Status   MRSA, PCR  NEGATIVE NEGATIVE Final   Staphylococcus aureus NEGATIVE NEGATIVE Final    Comment: (NOTE) The Xpert SA Assay (FDA approved for NASAL specimens in patients 46 years of age and older), is one component of a comprehensive surveillance program. It is not intended to diagnose infection nor to guide or monitor treatment. Performed at [facility], [address] [place], [place] [address]   Aerobic/Anaerobic Culture (surgical/deep wound)     Status: None (Preliminary result)   Collection Time: 12/24/19  4:41 PM   Specimen: PATH Breast other; Tissue  Result Value Ref Range Status   Specimen Description   Final    WOUND Performed at [facility], [address], [place], [place] [address]    Special Requests   Final    NONE Performed at [facility], [address] [place], [place] [address]    Gram Stain   Final    FEW WBC PRESENT, PREDOMINANTLY PMN RARE GRAM POSITIVE COCCI    Culture   Final    NO GROWTH < 24 HOURS Performed at [place] [address], [place], [place] [address]    Report Status PENDING  Incomplete    IMAGING: US BREAST LTD UNI LEFT INC AXILLA  Result Date: 12/22/2019 CLINICAL DATA:  24 year old female who presented to the emergency room for swelling and pain in the inferior left breast for 2 days. EXAM: ULTRASOUND OF THE LEFT BREAST COMPARISON:  None. FINDINGS: Ultrasound images labeled "left breast 5 o'clock region of area of redness and swelling" demonstrates an area of marked skin thickening measuring approximately 9 mm. Within this thickened area of skin, there is a hypoechoic mass measuring 1.3 x 0.6 x 1.2 cm. No masses, or fluid collections are seen deep to the skin surface. IMPRESSION: The findings are compatible with a sebaceous cyst, likely infected given the patient's  presentation. No drainable breast abscess is identified. RECOMMENDATION: Clinical follow-up to ensure resolution. The patient was discharged from the ER with antibiotic treatment for cellulitis and follow-up planned with Dr. Peyton Najjar from general surgery. I have discussed the findings and recommendations with the patient. If applicable, a reminder letter will be sent to the patient regarding the next appointment. BI-RADS CATEGORY  2: Benign. Electronically Signed   By: Ammie Ferrier M.D.   On: 12/22/2019 10:00    Assessment:   Kimberly Mosley is a 24 y.o. female admitted 3/4 with 4-5 days of progressive L breast abscess. Not breastfeeding, no trauma. Failed otpt clinda but now s/p I and D 3/4 with marked improvement in sxs. Has been on vanco and zosyn.  No fevers.  Recommendations Cont vanco Change zosyn to unasyn for now for broad coverage and possible anaerobic coverage while awaiting culture results. Likely can dc on oral regimen if culture grows organism.  Would rec 10 days abx post op.  Most likely to grow Staph aureus.  If MSSA- can dc on keflex 500 mg QID. If MRSA would use bactrim, doxy or clinda based on sensis.   Please call me over weekend if culture returns with other organisms and patient ready for dc for final recs. Thank you very much for allowing me to participate in the care of this patient. Please call with questions.   Cheral Marker. Ola Spurr, MD

## 2019-12-26 NOTE — Progress Notes (Signed)
SURGICAL PROGRESS NOTE   Hospital Day(s): 2.   Post op day(s): 2 Days Post-Op.   Interval History: Patient seen and examined, no acute events or new complaints overnight. Patient reports feeling a little bit better.  Denies fever chills.  There is no drainage.  No issue with the wound overnight.  Vital signs in last 24 hours: [min-max] current  Temp:  [97.7 F (36.5 C)-98.4 F (36.9 C)] 97.7 F (36.5 C) (03/06 0438) Pulse Rate:  [54-87] 54 (03/06 0438) Resp:  [16-20] 16 (03/06 0438) BP: (93-109)/(53-66) 93/53 (03/06 0438) SpO2:  [97 %-99 %] 98 % (03/06 0438)     Height: 5\' 3"  (160 cm) Weight: 81.6 kg BMI (Calculated): 31.89   Physical Exam:  Constitutional: alert, cooperative and no distress  Breast: Left breast open wound with adequate breast fat tissue.  No necrotic tissue.  Improved cellulitis.  Labs:  CBC Latest Ref Rng & Units 12/25/2019 12/24/2019 10/13/2018  WBC 4.0 - 10.5 K/uL 12.7(H) 12.5(H) 10.8(H)  Hemoglobin 12.0 - 15.0 g/dL 10/15/2018 92.1 19.4  Hematocrit 36.0 - 46.0 % 44.4 42.3 43.3  Platelets 150 - 400 K/uL 278 272 286   CMP Latest Ref Rng & Units 12/25/2019 12/24/2019 10/13/2018  Glucose 70 - 99 mg/dL 10/15/2018) 081(K) 95  BUN 6 - 20 mg/dL 10 11 9   Creatinine 0.44 - 1.00 mg/dL 481(E 5.63  Sodium 135 - 145 mmol/L 138 138 138  Potassium 3.5 - 5.1 mmol/L 4.7 3.9 3.6  Chloride 98 - 111 mmol/L 109 105 106  CO2 22 - 32 mmol/L 22 23 25   Calcium 8.9 - 10.3 mg/dL 9.1 9.3 9.3  Total Protein 6.4 - 8.2 g/dL - - -  Total Bilirubin 0.2 - 1.0 mg/dL - - -  Alkaline Phos Unit/L - - -  AST 15 - 37 Unit/L - - -  ALT U/L - - -    Imaging studies: No new pertinent imaging studies   Assessment/Plan:  24 y.o. female with left breast mastitis 2 Day Post-Op s/p mastectomy with deep exploration of abscess. Patient finally started to improve clinically.  There was decreased cellulitis around the wound.  Still with some cellulitis.  Still there is no results from cultures.  I discussed  the case with ID physician and recommendation was to wait until cultures come out for transition to oral antibiotic therapy.  We will continue with IV antibiotic therapy.  Patient able to ambulate.  We will continue with DVT prophylaxis.  Local care given today and wet-to-dry dressing applied.  Will repeat labs in the morning.  7.02, MD

## 2019-12-26 NOTE — Plan of Care (Signed)
Continuing with plan of care. 

## 2019-12-27 LAB — CBC
HCT: 38 % (ref 36.0–46.0)
Hemoglobin: 12.3 g/dL (ref 12.0–15.0)
MCH: 29.7 pg (ref 26.0–34.0)
MCHC: 32.4 g/dL (ref 30.0–36.0)
MCV: 91.8 fL (ref 80.0–100.0)
Platelets: 234 10*3/uL (ref 150–400)
RBC: 4.14 MIL/uL (ref 3.87–5.11)
RDW: 13.8 % (ref 11.5–15.5)
WBC: 10.2 10*3/uL (ref 4.0–10.5)
nRBC: 0 % (ref 0.0–0.2)

## 2019-12-27 MED ORDER — DOXYCYCLINE MONOHYDRATE 100 MG PO CAPS: 100.0000 mg | ORAL_CAPSULE | Freq: Two times a day (BID) | ORAL | 0 refills | Status: AC

## 2019-12-27 MED ORDER — SULFAMETHOXAZOLE-TRIMETHOPRIM 800-160 MG PO TABS: 1.0000 | ORAL_TABLET | Freq: Two times a day (BID) | ORAL | 0 refills | Status: DC

## 2019-12-27 NOTE — Plan of Care (Signed)
IV removed. IV antibiotics provided. No falls. Discharged information provided. Teach back method utilized.  Problem: Education: Goal: Knowledge of General Education information will improve Description: Including pain rating scale, medication(s)/side effects and non-pharmacologic comfort measures Outcome: Completed/Met   Problem: Health Behavior/Discharge Planning: Goal: Ability to manage health-related needs will improve Outcome: Completed/Met   Problem: Clinical Measurements: Goal: Ability to maintain clinical measurements within normal limits will improve Outcome: Completed/Met Goal: Will remain free from infection Outcome: Completed/Met Goal: Diagnostic test results will improve Outcome: Completed/Met Goal: Respiratory complications will improve Outcome: Completed/Met Goal: Cardiovascular complication will be avoided Outcome: Completed/Met   Problem: Nutrition: Goal: Adequate nutrition will be maintained Outcome: Completed/Met   Problem: Coping: Goal: Level of anxiety will decrease Outcome: Completed/Met   Problem: Elimination: Goal: Will not experience complications related to bowel motility Outcome: Completed/Met Goal: Will not experience complications related to urinary retention Outcome: Completed/Met   Problem: Pain Managment: Goal: General experience of comfort will improve Outcome: Completed/Met   Problem: Safety: Goal: Ability to remain free from injury will improve Outcome: Completed/Met   Problem: Skin Integrity: Goal: Risk for impaired skin integrity will decrease Outcome: Completed/Met

## 2019-12-27 NOTE — Progress Notes (Signed)
ID brief note I spoke with micro and they have subplated the organism which does look like staph aureus. Also skin flora is growing   Patient remains afebrile and wbc nml   If ready for Dc from surgery point of view could dc on bactrim DS BID and doxy 100 mg BID to double cover and once sensitivities are back tomorrow can adjust as otpt.

## 2019-12-27 NOTE — Plan of Care (Signed)
Patient tolerating regular diet and complained of pain at level 4. Tylenol was given. Ambulating without difficulty.  Will continue to monitor.  Arturo Morton

## 2019-12-27 NOTE — Discharge Instructions (Signed)
  Diet: Resume home heart healthy regular diet.   Activity: Increase activity as tolerated, but light activity and walking are encouraged. Do not drive or drink alcohol if taking narcotic pain medications.  Wound care: Change dressing (wet to dry) every day. May shower with soapy water and pat dry (do not rub incisions), but no baths or submerging incision underwater until follow-up. (no swimming)   Medications: Resume all home medications. For mild to moderate pain: acetaminophen (Tylenol) or ibuprofen (if no kidney disease). Combining Tylenol with alcohol can substantially increase your risk of causing liver disease.   Take antibiotic therapy as prescribed.   Call office [phone]) at any time if any questions, worsening pain, fevers/chills, bleeding, drainage from incision site, or other concerns.

## 2019-12-27 NOTE — Discharge Summary (Signed)
  Patient ID: Kimberly Mosley MRN: [id number] DOB/AGE: [birth year] 23 y.o.  Admit date: 12/24/2019 Discharge date: 12/27/2019   Discharge Diagnoses:  Active Problems:   Acute mastitis of left breast   Procedures: Mastotomy with exploration of deep tissue  Hospital Course: Patient with worsening mastitis despite adequate antibiotic therapy at home. Patient take to OR for mastotomy and deep exploration of left breast with cleansing and debridement of infected tissue. Tolerated procedure well. Started on Zosyn and Vanco which was changed by ID to Unasyn and Vanco. Wound healing better with no cellulitis. WBC count finally decreased to normal. Pain controlled. No drainage.   Physical Exam  Constitutional: She is oriented to person, place, and time and well-developed, well-nourished, and in no distress.  Cardiovascular: Normal rate.  Pulmonary/Chest: Effort normal.  Abdominal: Soft.  Neurological: She is alert and oriented to person, place, and time.  Left breast with open wound without cellulitis or drainage. No necrotic tissue.    Consults: Infectious disease.   Disposition: Discharge disposition: 01-Home or Self Care       Discharge Instructions    Diet - low sodium heart healthy   Complete by: As directed    Increase activity slowly   Complete by: As directed      Allergies as of 12/27/2019   No Known Allergies     Medication List    STOP taking these medications   clindamycin 300 MG capsule Commonly known as: CLEOCIN     TAKE these medications   doxycycline 100 MG capsule Commonly known as: MONODOX Take 1 capsule (100 mg total) by mouth 2 (two) times daily for 7 days.   sulfamethoxazole-trimethoprim 800-160 MG tablet Commonly known as: BACTRIM DS Take 1 tablet by mouth 2 (two) times daily.      Follow-up Information    Carolan Shiver, MD Follow up on 01/01/2020.   Specialty: General Surgery Contact information: [address] [place]  [address] [phone]

## 2019-12-31 LAB — AEROBIC/ANAEROBIC CULTURE W GRAM STAIN (SURGICAL/DEEP WOUND)

## 2020-02-20 ENCOUNTER — Other Ambulatory Visit: Payer: Self-pay

## 2020-02-20 ENCOUNTER — Emergency Department
Admission: EM | Admit: 2020-02-20 | Discharge: 2020-02-20 | Disposition: A | Discharge status: Home / Self Care | Payer: Medicaid Other | Attending: Emergency Medicine | Admitting: Emergency Medicine

## 2020-02-20 DIAGNOSIS — N390 Urinary tract infection, site not specified: Secondary | ICD-10-CM | POA: Diagnosis not present

## 2020-02-20 DIAGNOSIS — Z3202 Encounter for pregnancy test, result negative: Secondary | ICD-10-CM | POA: Diagnosis not present

## 2020-02-20 DIAGNOSIS — R110 Nausea: Secondary | ICD-10-CM | POA: Insufficient documentation

## 2020-02-20 DIAGNOSIS — F17210 Nicotine dependence, cigarettes, uncomplicated: Secondary | ICD-10-CM | POA: Diagnosis not present

## 2020-02-20 DIAGNOSIS — R1030 Lower abdominal pain, unspecified: Secondary | ICD-10-CM | POA: Diagnosis present

## 2020-02-20 LAB — URINALYSIS, COMPLETE (UACMP) WITH MICROSCOPIC
Bilirubin Urine: NEGATIVE
Glucose, UA: NEGATIVE mg/dL
Hgb urine dipstick: NEGATIVE
Ketones, ur: NEGATIVE mg/dL
Nitrite: POSITIVE — AB
Protein, ur: NEGATIVE mg/dL
Specific Gravity, Urine: 1.004 — ABNORMAL LOW (ref 1.005–1.030)
pH: 8 (ref 5.0–8.0)

## 2020-02-20 LAB — COMPREHENSIVE METABOLIC PANEL
ALT: 17 U/L (ref 0–44)
AST: 23 U/L (ref 15–41)
Albumin: 4.3 g/dL (ref 3.5–5.0)
Alkaline Phosphatase: 70 U/L (ref 38–126)
Anion gap: 7 (ref 5–15)
BUN: <5 mg/dL — ABNORMAL LOW (ref 6–20)
CO2: 24 mmol/L (ref 22–32)
Calcium: 9.2 mg/dL (ref 8.9–10.3)
Chloride: 109 mmol/L (ref 98–111)
Creatinine, Ser: 0.6 mg/dL (ref 0.44–1.00)
GFR calc Af Amer: >60 mL/min (ref >60)
GFR calc non Af Amer: >60 mL/min (ref >60)
Glucose, Bld: 102 mg/dL — ABNORMAL HIGH (ref 70–99)
Potassium: 3.4 mmol/L — ABNORMAL LOW (ref 3.5–5.1)
Sodium: 140 mmol/L (ref 135–145)
Total Bilirubin: 0.7 mg/dL (ref 0.3–1.2)
Total Protein: 7.5 g/dL (ref 6.5–8.1)

## 2020-02-20 LAB — HCG, QUANTITATIVE, PREGNANCY: hCG, Beta Chain, Quant, S: <1 m[IU]/mL (ref <5)

## 2020-02-20 LAB — CBC
HCT: 44.2 % (ref 36.0–46.0)
Hemoglobin: 15 g/dL (ref 12.0–15.0)
MCH: 29.5 pg (ref 26.0–34.0)
MCHC: 33.9 g/dL (ref 30.0–36.0)
MCV: 86.8 fL (ref 80.0–100.0)
Platelets: 270 10*3/uL (ref 150–400)
RBC: 5.09 MIL/uL (ref 3.87–5.11)
RDW: 13 % (ref 11.5–15.5)
WBC: 8.5 10*3/uL (ref 4.0–10.5)
nRBC: 0 % (ref 0.0–0.2)

## 2020-02-20 LAB — LIPASE, BLOOD: Lipase: 23 U/L (ref 11–51)

## 2020-02-20 LAB — POCT PREGNANCY, URINE: Preg Test, Ur: NEGATIVE

## 2020-02-20 MED ORDER — NAPROXEN 500 MG PO TABS: 500.0000 mg | ORAL_TABLET | Freq: Two times a day (BID) | ORAL | Status: DC

## 2020-02-20 MED ORDER — SULFAMETHOXAZOLE-TRIMETHOPRIM 800-160 MG PO TABS: 1.0000 | ORAL_TABLET | Freq: Two times a day (BID) | ORAL | 0 refills | Status: DC

## 2020-02-20 MED ORDER — ONDANSETRON HCL 8 MG PO TABS: 8.0000 mg | ORAL_TABLET | Freq: Three times a day (TID) | ORAL | 0 refills | Status: DC | PRN

## 2020-02-20 MED ORDER — PHENAZOPYRIDINE HCL 200 MG PO TABS: 200.0000 mg | ORAL_TABLET | Freq: Three times a day (TID) | ORAL | 0 refills | Status: DC | PRN

## 2020-02-20 NOTE — ED Provider Notes (Signed)
[facility] Emergency Department Provider Note   ____________________________________________   First MD Initiated Contact with Patient 02/20/20 1408     (approximate)  I have reviewed the triage vital signs and the nursing notes.   HISTORY  Chief Complaint Abdominal Pain    HPI Kimberly Mosley is a 24 y.o. female patient complain of bilateral lower abdominal pain which began last night.  Patient denies pain at this time.  Patient states she is having nausea.  Patient denies vaginal bleeding, flank pain, cough fever.  Patient states she took 3 home pregnancy test 6 days ago which were positive.  Patient had last menstrual period was January 15, 2020.  Patient is sexually active.      Past Medical History:  Diagnosis Date  . Anemia   . Anxiety    situational  . H/O Bell's palsy   . Medical history non-contributory     Patient Active Problem List   Diagnosis Date Noted  . Acute mastitis of left breast 12/24/2019  . Class 1 obesity due to excess calories without serious comorbidity with body mass index (BMI) of 32.0 to 32.9 in adult 06/05/2018  . Panic attack as reaction to stress 10/29/2017  . Anxiety and depression 05/01/2017  . Vaginal bleeding in pregnancy 09/13/2015    Past Surgical History:  Procedure Laterality Date  . INCISION AND DRAINAGE ABSCESS Left 12/24/2019   Procedure: INCISION AND DRAINAGE ABSCESS;  Surgeon: Carolan Shiver, MD;  Location: [facility] ORS;  Service: General;  Laterality: Left;  . NO PAST SURGERIES      Prior to Admission medications   Medication Sig Start Date End Date Taking? Authorizing Provider  naproxen (NAPROSYN) 500 MG tablet Take 1 tablet (500 mg total) by mouth 2 (two) times daily with a meal. 02/20/20   Joni Reining, PA-C  ondansetron (ZOFRAN) 8 MG tablet Take 1 tablet (8 mg total) by mouth every 8 (eight) hours as needed for nausea or vomiting. 02/20/20   Joni Reining, PA-C  phenazopyridine (PYRIDIUM)  200 MG tablet Take 1 tablet (200 mg total) by mouth 3 (three) times daily as needed for pain. 02/20/20   Joni Reining, PA-C  sulfamethoxazole-trimethoprim (BACTRIM DS) 800-160 MG tablet Take 1 tablet by mouth 2 (two) times daily. 12/27/19   Carolan Shiver, MD  sulfamethoxazole-trimethoprim (BACTRIM DS) 800-160 MG tablet Take 1 tablet by mouth 2 (two) times daily. 02/20/20   Joni Reining, PA-C  FLUoxetine (PROZAC) 20 MG capsule Take 1 capsule (20 mg total) by mouth daily. Patient not taking: Reported on 06/05/2018 10/29/17 11/26/19  Galen Manila, NP    Allergies Patient has no known allergies.  Family History  Problem Relation Age of Onset  . Depression Father   . Bipolar disorder Father   . Healthy Mother   . Healthy Sister   . Healthy Brother   . Cancer Maternal Uncle        pancreatic cancer  . Heart disease Maternal Grandmother   . Stroke Maternal Grandmother   . Healthy Maternal Grandfather   . Breast cancer Neg Hx   . Colon cancer Neg Hx   . Ovarian cancer Neg Hx     Social History Social History   Tobacco Use  . Smoking status: Current Every Day Smoker    Packs/day: 0.50    Years: 7.00    Pack years: 3.50    Types: Cigarettes  . Smokeless tobacco: Never Used  Substance Use Topics  .  Alcohol use: Yes    Alcohol/week: 2.0 standard drinks    Types: 2 Cans of beer per week    Comment: Last ETOH use 2 days ago.  . Drug use: No    Review of Systems  Constitutional: No fever/chills Eyes: No visual changes. ENT: No sore throat. Cardiovascular: Denies chest pain. Respiratory: Denies shortness of breath. Gastrointestinal:  abdominal pain.   nausea, no vomiting.  No diarrhea.  No constipation. Genitourinary: Negative for dysuria. Musculoskeletal: Negative for back pain. Skin: Negative for rash. Neurological: Negative for headaches, focal weakness or numbness. Psychiatric:  Situational anxiety. Hematological/Lymphatic:   Anemia ____________________________________________   PHYSICAL EXAM:  VITAL SIGNS: ED Triage Vitals  Enc Vitals Group     BP 02/20/20 1311 107/82     Pulse Rate 02/20/20 1311 (!) 101     Resp 02/20/20 1311 16     Temp 02/20/20 1311 97.9 F (36.6 C)     Temp Source 02/20/20 1311 Oral     SpO2 02/20/20 1311 98 %     Weight 02/20/20 1312 180 lb (81.6 kg)     Height 02/20/20 1312 5\' 3"  (1.6 m)     Head Circumference --      Peak Flow --      Pain Score 02/20/20 1312 0     Pain Loc --      Pain Edu? --      Excl. in GC? --    Constitutional: Alert and oriented. Well appearing and in no acute distress. Cardiovascular: Normal rate, regular rhythm. Grossly normal heart sounds.  Good peripheral circulation. Respiratory: Normal respiratory effort.  No retractions. Lungs CTAB. Gastrointestinal: Soft and nontender. No distention. No abdominal bruits. No CVA tenderness. Genitourinary: Deferred Musculoskeletal: No lower extremity tenderness nor edema.  No joint effusions. Neurologic:  Normal speech and language. No gross focal neurologic deficits are appreciated. No gait instability. Skin:  Skin is warm, dry and intact. No rash noted. Psychiatric: Mood and affect are normal. Speech and behavior are normal.  ____________________________________________   LABS (all labs ordered are listed, but only abnormal results are displayed)  Labs Reviewed  COMPREHENSIVE METABOLIC PANEL - Abnormal; Notable for the following components:      Result Value   Potassium 3.4 (*)    Glucose, Bld 102 (*)    BUN <5 (*)    All other components within normal limits  URINALYSIS, COMPLETE (UACMP) WITH MICROSCOPIC - Abnormal; Notable for the following components:   Color, Urine YELLOW (*)    APPearance CLOUDY (*)    Specific Gravity, Urine 1.004 (*)    Nitrite POSITIVE (*)    Leukocytes,Ua SMALL (*)    Bacteria, UA MANY (*)    All other components within normal limits  LIPASE, BLOOD  CBC  HCG,  QUANTITATIVE, PREGNANCY  POC URINE PREG, ED  POCT PREGNANCY, URINE   ____________________________________________  EKG   ____________________________________________  RADIOLOGY  ED MD interpretation:    Official radiology report(s): No results found.  ____________________________________________   PROCEDURES  Procedure(s) performed (including Critical Care):  Procedures   ____________________________________________   INITIAL IMPRESSION / ASSESSMENT AND PLAN / ED COURSE  As part of my medical decision making, I reviewed the following data within the electronic MEDICAL RECORD NUMBER     Patient presents bilateral abdominal pain which has resolved prior to my exam.  Discussed lab results with patient showing no pregnancy but is positive for nitrates and bacteria.  Culture is pending.  Patient given discharge care  instruction for urinary tract infection.  Patient advised to follow-up PCP and take medication as directed.   LYDIE STAMMEN was evaluated in Emergency Department on 02/20/2020 for the symptoms described in the history of present illness. She was evaluated in the context of the global COVID-19 pandemic, which necessitated consideration that the patient might be at risk for infection with the SARS-CoV-2 virus that causes COVID-19. Institutional protocols and algorithms that pertain to the evaluation of patients at risk for COVID-19 are in a state of rapid change based on information released by regulatory bodies including the CDC and federal and state organizations. These policies and algorithms were followed during the patient's care in the ED.        ____________________________________________   FINAL CLINICAL IMPRESSION(S) / ED DIAGNOSES  Final diagnoses:  Lower urinary tract infectious disease     ED Discharge Orders         Ordered    sulfamethoxazole-trimethoprim (BACTRIM DS) 800-160 MG tablet  2 times daily     02/20/20 1429    naproxen (NAPROSYN) 500  MG tablet  2 times daily with meals     02/20/20 1429    phenazopyridine (PYRIDIUM) 200 MG tablet  3 times daily PRN     02/20/20 1429    ondansetron (ZOFRAN) 8 MG tablet  Every 8 hours PRN     02/20/20 1430           Note:  This document was prepared using Dragon voice recognition software and may include unintentional dictation errors.    Sable Feil, PA-C 02/20/20 1439    Delman Kitten, MD 02/20/20 [phone]

## 2020-02-20 NOTE — ED Triage Notes (Signed)
FIRST NURSE NOTE: Pt took at home preg test on Monday was positive and now c/o "stomach pains"

## 2020-02-20 NOTE — ED Triage Notes (Signed)
Pt states she took 3 at home preg test Monday which were positive, last pm pt started having bilat abd pain, denies any pain at this time, pt reports nausea as well, denies any vaginal bleeding lmp march 26th

## 2020-02-20 NOTE — Discharge Instructions (Signed)
Follow discharge care instructions take medication as directed. °

## 2020-02-22 LAB — URINE CULTURE: Culture: >=100000 — AB

## 2020-03-06 ENCOUNTER — Emergency Department
Admission: EM | Admit: 2020-03-06 | Discharge: 2020-03-06 | Disposition: A | Discharge status: Home / Self Care | Payer: Medicaid Other | Attending: Emergency Medicine | Admitting: Emergency Medicine

## 2020-03-06 ENCOUNTER — Encounter: Payer: Self-pay | Admitting: Emergency Medicine

## 2020-03-06 ENCOUNTER — Other Ambulatory Visit: Payer: Self-pay

## 2020-03-06 DIAGNOSIS — X16XXXA Contact with hot heating appliances, radiators and pipes, initial encounter: Secondary | ICD-10-CM | POA: Insufficient documentation

## 2020-03-06 DIAGNOSIS — S8991XA Unspecified injury of right lower leg, initial encounter: Secondary | ICD-10-CM | POA: Diagnosis present

## 2020-03-06 DIAGNOSIS — Y999 Unspecified external cause status: Secondary | ICD-10-CM | POA: Insufficient documentation

## 2020-03-06 DIAGNOSIS — F17210 Nicotine dependence, cigarettes, uncomplicated: Secondary | ICD-10-CM | POA: Diagnosis not present

## 2020-03-06 DIAGNOSIS — T24231A Burn of second degree of right lower leg, initial encounter: Secondary | ICD-10-CM | POA: Insufficient documentation

## 2020-03-06 DIAGNOSIS — T310 Burns involving less than 10% of body surface: Secondary | ICD-10-CM | POA: Diagnosis not present

## 2020-03-06 DIAGNOSIS — Y9389 Activity, other specified: Secondary | ICD-10-CM | POA: Insufficient documentation

## 2020-03-06 DIAGNOSIS — Y929 Unspecified place or not applicable: Secondary | ICD-10-CM | POA: Insufficient documentation

## 2020-03-06 MED ORDER — SILVER SULFADIAZINE 1 % EX CREA: TOPICAL_CREAM | Freq: Once | CUTANEOUS | Status: AC

## 2020-03-06 MED ORDER — SILVER SULFADIAZINE 1 % EX CREA
TOPICAL_CREAM | CUTANEOUS | 1 refills | Status: AC
Start: 2020-03-06 — End: 2021-03-06
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
## 2020-05-12 DIAGNOSIS — N644 Mastodynia: Secondary | ICD-10-CM | POA: Diagnosis not present

## 2020-05-13 ENCOUNTER — Other Ambulatory Visit: Payer: Self-pay | Admitting: General Surgery

## 2020-05-13 DIAGNOSIS — N644 Mastodynia: Secondary | ICD-10-CM

## 2020-05-17 ENCOUNTER — Ambulatory Visit
Admission: RE | Admit: 2020-05-17 | Discharge: 2020-05-17 | Disposition: A | Discharge status: Home / Self Care | Payer: Medicaid Other | Source: Ambulatory Visit | Attending: General Surgery | Admitting: General Surgery

## 2020-05-17 DIAGNOSIS — N644 Mastodynia: Secondary | ICD-10-CM

## 2020-05-17 DIAGNOSIS — N6489 Other specified disorders of breast: Secondary | ICD-10-CM | POA: Diagnosis not present

## 2020-05-23 DIAGNOSIS — Z03818 Encounter for observation for suspected exposure to other biological agents ruled out: Secondary | ICD-10-CM | POA: Diagnosis not present

## 2020-09-09 DIAGNOSIS — H66001 Acute suppurative otitis media without spontaneous rupture of ear drum, right ear: Secondary | ICD-10-CM | POA: Diagnosis not present

## 2020-10-04 DIAGNOSIS — Z20828 Contact with and (suspected) exposure to other viral communicable diseases: Secondary | ICD-10-CM | POA: Diagnosis not present

## 2020-10-04 DIAGNOSIS — U071 COVID-19: Secondary | ICD-10-CM | POA: Diagnosis not present

## 2020-10-10 DIAGNOSIS — Z20828 Contact with and (suspected) exposure to other viral communicable diseases: Secondary | ICD-10-CM | POA: Diagnosis not present

## 2020-10-10 DIAGNOSIS — U071 COVID-19: Secondary | ICD-10-CM | POA: Diagnosis not present

## 2020-10-24 DIAGNOSIS — Z20828 Contact with and (suspected) exposure to other viral communicable diseases: Secondary | ICD-10-CM | POA: Diagnosis not present

## 2020-10-24 DIAGNOSIS — U071 COVID-19: Secondary | ICD-10-CM | POA: Diagnosis not present

## 2020-10-31 DIAGNOSIS — U071 COVID-19: Secondary | ICD-10-CM | POA: Diagnosis not present

## 2020-10-31 DIAGNOSIS — Z20828 Contact with and (suspected) exposure to other viral communicable diseases: Secondary | ICD-10-CM | POA: Diagnosis not present

## 2020-11-03 DIAGNOSIS — N644 Mastodynia: Secondary | ICD-10-CM | POA: Diagnosis not present

## 2020-11-08 DIAGNOSIS — U071 COVID-19: Secondary | ICD-10-CM | POA: Diagnosis not present

## 2020-11-08 DIAGNOSIS — Z20828 Contact with and (suspected) exposure to other viral communicable diseases: Secondary | ICD-10-CM | POA: Diagnosis not present

## 2020-11-14 DIAGNOSIS — Z20828 Contact with and (suspected) exposure to other viral communicable diseases: Secondary | ICD-10-CM | POA: Diagnosis not present

## 2020-11-14 DIAGNOSIS — U071 COVID-19: Secondary | ICD-10-CM | POA: Diagnosis not present

## 2020-11-21 DIAGNOSIS — U071 COVID-19: Secondary | ICD-10-CM | POA: Diagnosis not present

## 2020-11-21 DIAGNOSIS — Z20828 Contact with and (suspected) exposure to other viral communicable diseases: Secondary | ICD-10-CM | POA: Diagnosis not present

## 2020-11-28 DIAGNOSIS — U071 COVID-19: Secondary | ICD-10-CM | POA: Diagnosis not present

## 2020-11-28 DIAGNOSIS — Z20828 Contact with and (suspected) exposure to other viral communicable diseases: Secondary | ICD-10-CM | POA: Diagnosis not present

## 2021-01-20 DIAGNOSIS — J069 Acute upper respiratory infection, unspecified: Secondary | ICD-10-CM | POA: Diagnosis not present

## 2021-01-20 DIAGNOSIS — Z20822 Contact with and (suspected) exposure to covid-19: Secondary | ICD-10-CM | POA: Diagnosis not present

## 2021-01-20 DIAGNOSIS — J0100 Acute maxillary sinusitis, unspecified: Secondary | ICD-10-CM | POA: Diagnosis not present

## 2021-09-25 ENCOUNTER — Emergency Department
Admission: EM | Admit: 2021-09-25 | Discharge: 2021-09-25 | Disposition: A | Discharge status: Home / Self Care | Payer: BLUE CROSS/BLUE SHIELD | Attending: Emergency Medicine | Admitting: Emergency Medicine

## 2021-09-25 ENCOUNTER — Other Ambulatory Visit: Payer: Self-pay

## 2021-09-25 DIAGNOSIS — R55 Syncope and collapse: Secondary | ICD-10-CM | POA: Diagnosis not present

## 2021-09-25 DIAGNOSIS — N3000 Acute cystitis without hematuria: Secondary | ICD-10-CM | POA: Diagnosis not present

## 2021-09-25 DIAGNOSIS — F17210 Nicotine dependence, cigarettes, uncomplicated: Secondary | ICD-10-CM | POA: Diagnosis not present

## 2021-09-25 LAB — CBC
HCT: 44.2 % (ref 36.0–46.0)
Hemoglobin: 14.9 g/dL (ref 12.0–15.0)
MCH: 30 pg (ref 26.0–34.0)
MCHC: 33.7 g/dL (ref 30.0–36.0)
MCV: 89.1 fL (ref 80.0–100.0)
Platelets: 241 10*3/uL (ref 150–400)
RBC: 4.96 MIL/uL (ref 3.87–5.11)
RDW: 11.7 % (ref 11.5–15.5)
WBC: 13.4 10*3/uL — ABNORMAL HIGH (ref 4.0–10.5)
nRBC: 0 % (ref 0.0–0.2)

## 2021-09-25 LAB — URINALYSIS, ROUTINE W REFLEX MICROSCOPIC
Bilirubin Urine: NEGATIVE
Glucose, UA: NEGATIVE mg/dL
Hgb urine dipstick: NEGATIVE
Ketones, ur: NEGATIVE mg/dL
Nitrite: POSITIVE — AB
Protein, ur: NEGATIVE mg/dL
Specific Gravity, Urine: 1.01 (ref 1.005–1.030)
pH: 5 (ref 5.0–8.0)

## 2021-09-25 LAB — BASIC METABOLIC PANEL
Anion gap: 5 (ref 5–15)
BUN: 7 mg/dL (ref 6–20)
CO2: 24 mmol/L (ref 22–32)
Calcium: 8.5 mg/dL — ABNORMAL LOW (ref 8.9–10.3)
Chloride: 108 mmol/L (ref 98–111)
Creatinine, Ser: 0.58 mg/dL (ref 0.44–1.00)
GFR, Estimated: >60 mL/min (ref >60)
Glucose, Bld: 123 mg/dL — ABNORMAL HIGH (ref 70–99)
Potassium: 3.3 mmol/L — ABNORMAL LOW (ref 3.5–5.1)
Sodium: 137 mmol/L (ref 135–145)

## 2021-09-25 LAB — POC URINE PREG, ED: Preg Test, Ur: NEGATIVE

## 2021-09-25 MED ORDER — CEPHALEXIN 500 MG PO CAPS
500.0000 mg | ORAL_CAPSULE | Freq: Once | ORAL | Status: AC
  Administered 2021-09-25: 500 mg via ORAL
  Filled 2021-09-25: qty 1

## 2021-09-25 MED ORDER — CEPHALEXIN 500 MG PO CAPS: 500.0000 mg | ORAL_CAPSULE | Freq: Three times a day (TID) | ORAL | 0 refills | Status: AC

## 2021-09-25 MED ORDER — LACTATED RINGERS IV BOLUS
1000.0000 mL | Freq: Once | INTRAVENOUS | Status: AC
  Administered 2021-09-25: 1000 mL via INTRAVENOUS

## 2021-09-25 NOTE — ED Provider Notes (Signed)
[facility] Emergency Department Provider Note ____________________________________________   Event Date/Time   First MD Initiated Contact with Patient 09/25/21 2023     (approximate)  I have reviewed the triage vital signs and the nursing notes.  HISTORY  Chief Complaint Loss of Consciousness   HPI Kimberly Mosley is a 25 y.o. femalewho presents to the ED for evaluation of syncope.   Chart review indicates obese patient with minimal medical history.  Patient presents to the ED after an episode of syncope, presenting with her boyfriend, who provides some supplemental history.  She reports donating plasma this afternoon.  She reports there was some error with the machine and it took a full bag of blood for her, and were unable to return any blood cells or plasma.  She reports that they just "kicked her out" after this.   She went with her boyfriend to go pick up her kid at daycare.  She reports walking inside, feeling dizzy and passing out.  She reportedly had tremulousness and shaking diffusely for about 10 seconds before returning to consciousness.  Denies tongue biting, incontinence and there was no postictal period reported.  The boyfriend was called inside and found her awake, laying on the floor and very pale appearing, but at her baseline.  EMS places an IV in place IV fluids, patient reports feeling much better after this.  She denies any abdominal pain, emesis, dysuria or urinary frequency.  Past Medical History:  Diagnosis Date   Anemia    Anxiety    situational   H/O Bell's palsy    Medical history non-contributory     Patient Active Problem List   Diagnosis Date Noted   Acute mastitis of left breast 12/24/2019   Class 1 obesity due to excess calories without serious comorbidity with body mass index (BMI) of 32.0 to 32.9 in adult 06/05/2018   Panic attack as reaction to stress 10/29/2017   Anxiety and depression 05/01/2017   Vaginal  bleeding in pregnancy 09/13/2015    Past Surgical History:  Procedure Laterality Date   INCISION AND DRAINAGE ABSCESS Left 12/24/2019   Procedure: INCISION AND DRAINAGE ABSCESS;  Surgeon: Carolan Shiver, MD;  Location: [facility] ORS;  Service: General;  Laterality: Left;   NO PAST SURGERIES      Prior to Admission medications   Medication Sig Start Date End Date Taking? Authorizing Provider  cephALEXin (KEFLEX) 500 MG capsule Take 1 capsule (500 mg total) by mouth 3 (three) times daily for 5 days. 09/25/21 09/30/21 Yes Delton Prairie, MD  FLUoxetine (PROZAC) 20 MG capsule Take 1 capsule (20 mg total) by mouth daily. Patient not taking: Reported on 06/05/2018 10/29/17 11/26/19  Galen Manila, NP    Allergies Patient has no known allergies.  Family History  Problem Relation Age of Onset   Depression Father    Bipolar disorder Father    Healthy Mother    Healthy Sister    Healthy Brother    Cancer Maternal Uncle        pancreatic cancer   Heart disease Maternal Grandmother    Stroke Maternal Grandmother    Healthy Maternal Grandfather    Breast cancer Neg Hx    Colon cancer Neg Hx    Ovarian cancer Neg Hx     Social History Social History   Tobacco Use   Smoking status: Every Day    Packs/day: 0.50    Years: 7.00    Pack years: 3.50  Types: Cigarettes   Smokeless tobacco: Never  Vaping Use   Vaping Use: Never used  Substance Use Topics   Alcohol use: Yes    Alcohol/week: 2.0 standard drinks    Types: 2 Cans of beer per week    Comment: Last ETOH use 2 days ago.   Drug use: No    Review of Systems  Constitutional: No fever/chills.  Positive for syncope Eyes: No visual changes. ENT: No sore throat. Cardiovascular: Denies chest pain. Respiratory: Denies shortness of breath. Gastrointestinal: No abdominal pain.  No nausea, no vomiting.  No diarrhea.  No constipation. Genitourinary: Negative for dysuria. Musculoskeletal: Negative for back pain. Skin:  Negative for rash. Neurological: Negative for headaches, focal weakness or numbness.  ____________________________________________   PHYSICAL EXAM:  VITAL SIGNS: Vitals:   09/25/21 1946  BP: 107/71  Pulse: 92  Resp: 20  Temp: 98.4 F (36.9 C)  SpO2: 99%    Constitutional: Alert and oriented. Well appearing and in no acute distress. Eyes: Conjunctivae are normal. PERRL. EOMI. Head: Atraumatic. Nose: No congestion/rhinnorhea. Mouth/Throat: Mucous membranes are moist.  Oropharynx non-erythematous. Neck: No stridor. No cervical spine tenderness to palpation. Cardiovascular: Normal rate, regular rhythm. Grossly normal heart sounds.  Good peripheral circulation. Respiratory: Normal respiratory effort.  No retractions. Lungs CTAB. Gastrointestinal: Soft , nondistended, nontender to palpation. No CVA tenderness. Musculoskeletal: No lower extremity tenderness nor edema.  No joint effusions. No signs of acute trauma. Neurologic:  Normal speech and language. No gross focal neurologic deficits are appreciated. No gait instability noted. Cranial nerves II through XII intact 5/5 strength and sensation in all 4 extremities Skin:  Skin is warm, dry and intact. No rash noted. Psychiatric: Mood and affect are normal. Speech and behavior are normal. ____________________________________________   LABS (all labs ordered are listed, but only abnormal results are displayed)  Labs Reviewed  BASIC METABOLIC PANEL - Abnormal; Notable for the following components:      Result Value   Potassium 3.3 (*)    Glucose, Bld 123 (*)    Calcium 8.5 (*)    All other components within normal limits  CBC - Abnormal; Notable for the following components:   WBC 13.4 (*)    All other components within normal limits  URINALYSIS, ROUTINE W REFLEX MICROSCOPIC - Abnormal; Notable for the following components:   Color, Urine YELLOW (*)    APPearance CLOUDY (*)    Nitrite POSITIVE (*)    Leukocytes,Ua SMALL (*)     Bacteria, UA MANY (*)    All other components within normal limits  URINE CULTURE  CBG MONITORING, ED  POC URINE PREG, ED   ____________________________________________  12 Lead EKG  Sinus tachycardia with a rate of 103 bpm.  Normal axis and intervals.  No evidence of acute ischemia. ____________________________________________  RADIOLOGY  ED MD interpretation:    Official radiology report(s): No results found.  ____________________________________________   PROCEDURES and INTERVENTIONS  Procedure(s) performed (including Critical Care):  .1-3 Lead EKG Interpretation Performed by: Vladimir Crofts, MD Authorized by: Vladimir Crofts, MD     Interpretation: normal     ECG rate:  88   ECG rate assessment: normal     Rhythm: sinus rhythm     Ectopy: none     Conduction: normal    Medications  cephALEXin (KEFLEX) capsule 500 mg (has no administration in time range)  lactated ringers bolus 1,000 mL (has no administration in time range)    ____________________________________________   MDM / ED COURSE  25 year old female presents to the ED after an episode of syncope, likely vasovagal, found to have some evidence of acute cystitis and ultimately amenable to outpatient management.  I discussed with her the possibility of seizure, though less likely considering the clinical picture they present.  With her having just donated blood/plasma, having evidence of a UTI, and the syndrome described to me suspicion is more so a benign vasovagal syncope than a cardiac syncope or seizure.  She expresses understanding and agreement with this, though we discussed the possibility of unrecognized seizure.  She has a mild leukocytosis here in her urine has infectious features, so we will send for culture.  I think it warrants initiating empiric course of Keflex antibiotics for a few days.  We will provide supplemental IV fluids.  EKG without concerning interval changes to suggest cardiac etiology  of syncope.  Anticipate she be suitable for outpatient management.     ____________________________________________   FINAL CLINICAL IMPRESSION(S) / ED DIAGNOSES  Final diagnoses:  Syncope and collapse  Acute cystitis without hematuria     ED Discharge Orders          Ordered    cephALEXin (KEFLEX) 500 MG capsule  3 times daily        09/25/21 2038             Greg Cratty   Note:  This document was prepared using Dragon voice recognition software and may include unintentional dictation errors.    Vladimir Crofts, MD 09/25/21 2104

## 2021-09-25 NOTE — ED Triage Notes (Addendum)
Pt BIB EMS due to having syncopal episode about an hour ago. Pt denies any illnesses recently. Per pt, she has nto eaten much today. Per friend, pt LOC about 10 seconds. Pt did give plasma today.

## 2021-09-25 NOTE — Discharge Instructions (Signed)
You are being discharged with a 5-day course of Keflex antibiotic to take 3 times daily.  Please finish all 15 pills.  This is to treat an early UTI that we are seeing on your urine testing.  If you have other episodes of passing out or signs of seizure, please return to the ED.

## 2021-09-28 LAB — URINE CULTURE: Culture: >=100000 — AB

## 2021-10-02 ENCOUNTER — Ambulatory Visit: Payer: Self-pay | Admitting: *Deleted

## 2021-10-02 NOTE — Telephone Encounter (Signed)
Pt calling in.   Last Monday a week ago.   I was rushed to ED.   I passed out and was shaking.  They said it was two light seizures.    I think I'm having anxiety attacks again.   I had a bad UTI in the ED.   I've finished that prescription for that.     Pt has a new pt appt for Jan. 9th with Dr. Charlotta Newton.    I let her know if she was having the same symptoms that she should return to the ED.  I let her know until she was seen and established that Dr. Charlotta Newton wasn't able to see her.   She verbalized understanding and was agreeable to returning to the ED.   Reason for Disposition . Not on or ran out of seizure medicine (anticonvulsant)    Possibly was having seizures per prior ED visit for same symptoms.  Answer Assessment - Initial Assessment Questions 1. ONSET: "How long did the seizure last?" (e.g., seconds, minutes)      Pt was rushed to the ED a week ago Monday because she was shaking.   They told her she maybe had 2 light seizures and also a bad UTI.   She has completed her antibiotics for the UTI.   She feels like she is having the same symptoms as when they took her to the ED.   She mentioned she has problems with anxiety and doesn't know if it's that or not.   She doesn't have any anxiety medication if it is anxiety.    I referred her back to the ED since she is not an established pt with [facility].   She has a new pt appt with Dr. Charlotta Newton on Jan. 9th, 2023.    Pt was agreeable to returning to the ED.  2. CONTENT: "Describe what happened during the seizure. Did the body become stiff? Was there any jerking?"      *No Answer* 3. CIRCUMSTANCE: "What was the person doing when the seizure began?"      *No Answer* 4. MENTAL STATUS: "Does the person know who they are, who you are, and where they are?"      *No Answer* 5. PRIOR SEIZURES: "Has the person had a seizure (convulsion) before?" If Yes, ask: "When was the last time?" and "What happened last time?"     *No Answer* 6. EPILEPSY: "Does  the person have epilepsy?" Note: Check for medical ID bracelet.     *No Answer* 7. MEDICINES: "Does the person take anticonvulsant medications?" (e.g., Yes, No; compliance, any recent changes)     *No Answer* 8. INJURY: "Did the person hurt himself during the seizure?" (e.g., head, tongue)     *No Answer* 9. OTHER SYMPTOMS: "Are there any other symptoms?" (e.g., fever, headache)     *No Answer* 10. PREGNANCY: "Is there any chance you are pregnant?" "When was your last menstrual period?"       *No Answer*  Protocols used: [employer]

## 2021-10-25 ENCOUNTER — Encounter: Payer: Self-pay | Admitting: *Deleted

## 2021-10-25 ENCOUNTER — Ambulatory Visit: Payer: Self-pay

## 2021-10-25 NOTE — Telephone Encounter (Signed)
This encounter was created in error - please disregard.

## 2021-10-25 NOTE — Telephone Encounter (Signed)
°  Chief Complaint: right ear drainage getting worse Symptoms: clear to white, cloudy at times discharge , clogged up , decrease hearing  Frequency: couple of days ago  Pertinent Negatives: Patient denies pain, fever Disposition: [] ED /[x] Urgent Care (no appt availability in office) / [] Appointment(In office/virtual)/ []  [place] [facility]/ [] Home Care/ [] Refused Recommended Disposition /[] [facility]/ []  Follow-up with PCP Additional Notes:  New patient for [facility]   Reason for Disposition  Cloudy - white discharge  Answer Assessment - Initial Assessment Questions 1. LOCATION: "Which ear is involved?"      Right ear  2. COLOR: "What is the color of the discharge?"      White to clear  3. CONSISTENCY: "How runny is the discharge? Could it be water?"      Like water  4. ONSET: "When did you first notice the discharge?"     Couple of days ago  5. PAIN: "Is there any earache?" "How bad is it?"  (Scale 1-10; or mild, moderate, severe)     Minimal  6. OBJECTS: "Have you put anything in your ear?" (e.g., Q-tip, other object)      na 7. OTHER SYMPTOMS: "Do you have any other symptoms?" (e.g., headache, fever, dizziness, vomiting, runny nose)     Clogged sound, decrease hearing  8. PREGNANCY: "Is there any chance you are pregnant?" "When was your last menstrual period?"     na  Protocols used: Ear - Discharge-A-AH

## 2021-10-25 NOTE — Telephone Encounter (Signed)
Patient called, left VM to return the call to the office to speak to a nurse.   Message from Traci Sermon sent at 10/25/2021  8:44 AM EST  Pt called in stating she is having some bad drainage in her ear and wanted to speak with a nurse about what she can do, pt is also not established with the office yet, please advise.

## 2021-10-25 NOTE — Telephone Encounter (Signed)
Patient called, left VM to return the call to the office to discuss symptoms with a nurse. Patient is not established at this time with [facility], will close encounter.    Message from Traci Sermon sent at 10/25/2021  8:44 AM EST  Pt called in stating she is having some bad drainage in her ear and wanted to speak with a nurse about what she can do, pt is also not established with the office yet, please advise.

## 2021-10-25 NOTE — Telephone Encounter (Signed)
2nd attempt to reach pt, left VM to call back to discuss symptoms. 

## 2021-10-30 ENCOUNTER — Encounter: Payer: Self-pay | Admitting: Internal Medicine

## 2021-10-30 ENCOUNTER — Ambulatory Visit (INDEPENDENT_AMBULATORY_CARE_PROVIDER_SITE_OTHER): Payer: Medicaid Other | Admitting: Internal Medicine

## 2021-10-30 ENCOUNTER — Other Ambulatory Visit: Payer: Self-pay

## 2021-10-30 VITALS — BP 125/79 | HR 79 | Temp 98.4°F | Ht 62.21 in | Wt 150.2 lb

## 2021-10-30 DIAGNOSIS — R55 Syncope and collapse: Secondary | ICD-10-CM | POA: Diagnosis not present

## 2021-10-30 DIAGNOSIS — H9191 Unspecified hearing loss, right ear: Secondary | ICD-10-CM | POA: Diagnosis not present

## 2021-10-30 LAB — URINALYSIS, ROUTINE W REFLEX MICROSCOPIC
Bilirubin, UA: NEGATIVE
Glucose, UA: NEGATIVE
Ketones, UA: NEGATIVE
Leukocytes,UA: NEGATIVE
Nitrite, UA: NEGATIVE
Protein,UA: NEGATIVE
RBC, UA: NEGATIVE
Specific Gravity, UA: 1.02 (ref 1.005–1.030)
Urobilinogen, Ur: 0.2 mg/dL (ref 0.2–1.0)
pH, UA: 7 (ref 5.0–7.5)

## 2021-10-30 LAB — BAYER DCA HB A1C WAIVED: HB A1C (BAYER DCA - WAIVED): 5.1 % (ref 4.8–5.6)

## 2021-10-30 MED ORDER — NICOTINE 21 MG/24HR TD PT24: 21.0000 mg | MEDICATED_PATCH | Freq: Every day | TRANSDERMAL | 0 refills | Status: DC
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
## 2021-10-31 LAB — COMPREHENSIVE METABOLIC PANEL
ALT: 17 IU/L (ref 0–32)
AST: 23 IU/L (ref 0–40)
Albumin/Globulin Ratio: 2.2 (ref 1.2–2.2)
Albumin: 4.7 g/dL (ref 3.9–5.0)
Alkaline Phosphatase: 68 IU/L (ref 44–121)
BUN/Creatinine Ratio: 8 — ABNORMAL LOW (ref 9–23)
BUN: 5 mg/dL — ABNORMAL LOW (ref 6–20)
Bilirubin Total: <0.2 mg/dL (ref 0.0–1.2)
CO2: 23 mmol/L (ref 20–29)
Calcium: 9.4 mg/dL (ref 8.7–10.2)
Chloride: 104 mmol/L (ref 96–106)
Creatinine, Ser: 0.63 mg/dL (ref 0.57–1.00)
Globulin, Total: 2.1 g/dL (ref 1.5–4.5)
Glucose: 81 mg/dL (ref 70–99)
Potassium: 4.3 mmol/L (ref 3.5–5.2)
Sodium: 141 mmol/L (ref 134–144)
Total Protein: 6.8 g/dL (ref 6.0–8.5)
eGFR: 125 mL/min/{1.73_m2} (ref >59)

## 2021-10-31 LAB — CBC WITH DIFFERENTIAL/PLATELET
Basophils Absolute: 0.1 10*3/uL (ref 0.0–0.2)
Basos: 1 %
EOS (ABSOLUTE): 0.1 10*3/uL (ref 0.0–0.4)
Eos: 2 %
Hematocrit: 40.1 % (ref 34.0–46.6)
Hemoglobin: 13.3 g/dL (ref 11.1–15.9)
Immature Grans (Abs): 0 10*3/uL (ref 0.0–0.1)
Immature Granulocytes: 0 %
Lymphocytes Absolute: 3.2 10*3/uL — ABNORMAL HIGH (ref 0.7–3.1)
Lymphs: 43 %
MCH: 29.8 pg (ref 26.6–33.0)
MCHC: 33.2 g/dL (ref 31.5–35.7)
MCV: 90 fL (ref 79–97)
Monocytes Absolute: 0.4 10*3/uL (ref 0.1–0.9)
Monocytes: 6 %
Neutrophils Absolute: 3.5 10*3/uL (ref 1.4–7.0)
Neutrophils: 48 %
Platelets: 324 10*3/uL (ref 150–450)
RBC: 4.47 x10E6/uL (ref 3.77–5.28)
RDW: 11.7 % (ref 11.7–15.4)
WBC: 7.3 10*3/uL (ref 3.4–10.8)

## 2021-10-31 LAB — TSH: TSH: 1.12 u[IU]/mL (ref 0.450–4.500)

## 2021-11-01 ENCOUNTER — Ambulatory Visit: Payer: Self-pay

## 2021-11-01 NOTE — Telephone Encounter (Signed)
Routing to provider to advise.  

## 2021-11-01 NOTE — Telephone Encounter (Signed)
Please call and schedule the patient an appointment for tomorrow per Dr. Charlotta Newton. Can be virtual

## 2021-11-01 NOTE — Telephone Encounter (Signed)
° ° °  Chief Complaint: Had a panic attack last night in grocery store. Requesting medication. Symptoms: Felt irritated and panicky Frequency: Started having panic attacks in December. States she has talked to PCP about this.  Pertinent Negatives: Patient denies any suicidal thoughts Disposition: [] ED /[] Urgent Care (no appt availability in office) / [] Appointment(In office/virtual)/ []  [place]/ [] Home Care/ [] Refused Recommended Disposition /[] [place] [facility]/ []  Follow-up with PCP Additional Notes: Pt. Is requesting medication for panic. Please advise.  Answer Assessment - Initial Assessment Questions 1. CONCERN: "Did anything happen that prompted you to call today?"      Had panic attack last 2. ANXIETY SYMPTOMS: "Can you describe how you (your loved one; patient) have been feeling?" (e.g., tense, restless, panicky, anxious, keyed up, overwhelmed, sense of impending doom).      Panicky, irritated 3. ONSET: "How long have you been feeling this way?" (e.g., hours, days, weeks)     December 4. SEVERITY: "How would you rate the level of anxiety?" (e.g., 0 - 10; or mild, moderate, severe).     Mild - moderate 5. FUNCTIONAL IMPAIRMENT: "How have these feelings affected your ability to do daily activities?" "Have you had more difficulty than usual doing your normal daily activities?" (e.g., getting better, same, worse; self-care, school, work, interactions)     Happens when she leaves home 6. HISTORY: "Have you felt this way before?" "Have you ever been diagnosed with an anxiety problem in the past?" (e.g., generalized anxiety disorder, panic attacks, PTSD). If Yes, ask: "How was this problem treated?" (e.g., medicines, counseling, etc.)     Yes 7. RISK OF HARM - SUICIDAL IDEATION: "Do you ever have thoughts of hurting or killing yourself?" If Yes, ask:  "Do you have these feelings now?" "Do you have a plan on how you would do this?"     No 8. TREATMENT:  "What has been done  so far to treat this anxiety?" (e.g., medicines, relaxation strategies). "What has helped?"     Nothing 9. TREATMENT - THERAPIST: "Do you have a counselor or therapist? Name?"     No 10. POTENTIAL TRIGGERS: "Do you drink caffeinated beverages (e.g., coffee, colas, teas), and how much daily?" "Do you drink alcohol or use any drugs?" "Have you started any new medicines recently?"     No 10. PATIENT SUPPORT: "Who is with you now?" "Who do you live with?" "Do you have family or friends who you can talk to?"        Yes 11. OTHER SYMPTOMS: "Do you have any other symptoms?" (e.g., feeling depressed, trouble concentrating, trouble sleeping, trouble breathing, palpitations or fast heartbeat, chest pain, sweating, nausea, or diarrhea)       None 12. PREGNANCY: "Is there any chance you are pregnant?" "When was your last menstrual period?"       None  Protocols used: Anxiety and Panic Attack-A-AH

## 2021-11-02 ENCOUNTER — Ambulatory Visit (INDEPENDENT_AMBULATORY_CARE_PROVIDER_SITE_OTHER): Payer: Medicaid Other | Admitting: Internal Medicine

## 2021-11-02 ENCOUNTER — Other Ambulatory Visit: Payer: Self-pay | Admitting: Internal Medicine

## 2021-11-02 ENCOUNTER — Encounter: Payer: Self-pay | Admitting: Internal Medicine

## 2021-11-02 ENCOUNTER — Other Ambulatory Visit: Payer: Self-pay

## 2021-11-02 VITALS — BP 110/70 | HR 79 | Temp 98.7°F | Ht 62.21 in | Wt 151.6 lb

## 2021-11-02 DIAGNOSIS — F419 Anxiety disorder, unspecified: Secondary | ICD-10-CM | POA: Diagnosis not present

## 2021-11-02 MED ORDER — CITALOPRAM HYDROBROMIDE 10 MG PO TABS: 10.0000 mg | ORAL_TABLET | Freq: Every day | ORAL | 1 refills | Status: DC

## 2021-11-02 MED ORDER — HYDROXYZINE PAMOATE 25 MG PO CAPS: 25.0000 mg | ORAL_CAPSULE | Freq: Three times a day (TID) | ORAL | 0 refills | Status: DC | PRN

## 2021-11-02 NOTE — Addendum Note (Signed)
Addended by: Donzetta Kohut A on: 11/02/2021 03:30 PM   Modules accepted: Orders

## 2021-11-02 NOTE — Telephone Encounter (Signed)
Medication Refill - Medication: citalopram (CELEXA) 10 MG tablet  Has the patient contacted their pharmacy? Yes.    (Agent: If yes, when and what did the pharmacy advise?) Pcp forgot to send medication in  Preferred Pharmacy (with phone number or street name): [facility] [place], [place] - [address]  Phone:  [phone] Fax:  [phone] Has the patient been seen for an appointment in the last year OR does the patient have an upcoming appointment? Yes.    Agent: Please be advised that RX refills may take up to 3 business days. We ask that you follow-up with your pharmacy.

## 2021-11-02 NOTE — Telephone Encounter (Signed)
Pt scheduled for today at 2:40 pm °

## 2021-11-02 NOTE — Progress Notes (Signed)
BP 110/70    Pulse 79    Temp 98.7 F (37.1 C) (Oral)    Ht 5' 2.21" (1.58 m)    Wt 151 lb 9.6 oz (68.8 kg)    SpO2 99%    BMI 27.55 kg/m    Subjective:    Patient ID: Kimberly Mosley, female    DOB: 24-Feb-1996, 26 y.o.   MRN: [id number]  Chief Complaint  Patient presents with   Anxiety    Has been having panic attacks since december    HPI: Kimberly Mosley is a 26 y.o. female  Had a panic attack on Monday @ grocery store.  Anxiety Presents for initial (has been off of meds, has had 2 seizures last month. scared of going out.) visit. Onset was in the past 7 days. The problem has been waxing and waning. Symptoms include excessive worry, hyperventilation, irritability, nervous/anxious behavior, palpitations, panic, restlessness and shortness of breath. Patient reports no chest pain, compulsions, decreased concentration, depressed mood, dizziness, dry mouth, feeling of choking, impotence, insomnia, nausea, obsessions or suicidal ideas.     Chief Complaint  Patient presents with   Anxiety    Has been having panic attacks since december    Relevant past medical, surgical, family and social history reviewed and updated as indicated. Interim medical history since our last visit reviewed. Allergies and medications reviewed and updated.  Review of Systems  Constitutional:  Positive for irritability.  Respiratory:  Positive for shortness of breath.   Cardiovascular:  Positive for palpitations. Negative for chest pain.  Gastrointestinal:  Negative for nausea.  Genitourinary:  Negative for impotence.  Neurological:  Negative for dizziness.  Psychiatric/Behavioral:  Negative for decreased concentration and suicidal ideas. The patient is nervous/anxious. The patient does not have insomnia.    Per HPI unless specifically indicated above     Objective:    BP 110/70    Pulse 79    Temp 98.7 F (37.1 C) (Oral)    Ht 5' 2.21" (1.58 m)    Wt 151 lb 9.6 oz (68.8 kg)    SpO2 99%    BMI  27.55 kg/m   Wt Readings from Last 3 Encounters:  11/02/21 151 lb 9.6 oz (68.8 kg)  10/30/21 150 lb 3.2 oz (68.1 kg)  03/06/20 180 lb (81.6 kg)    Physical Exam Vitals and nursing note reviewed.  Constitutional:      General: She is not in acute distress.    Appearance: Normal appearance. She is not ill-appearing or diaphoretic.  Skin:    General: Skin is warm and dry.     Coloration: Skin is not jaundiced.     Findings: No erythema.  Neurological:     Mental Status: She is alert.  Psychiatric:        Mood and Affect: Mood normal.        Behavior: Behavior normal.        Thought Content: Thought content normal.        Judgment: Judgment normal.    Results for orders placed or performed in visit on 10/30/21  CBC with Differential/Platelet  Result Value Ref Range   WBC 7.3 3.4 - 10.8 x10E3/uL   RBC 4.47 3.77 - 5.28 x10E6/uL   Hemoglobin 13.3 11.1 - 15.9 g/dL   Hematocrit 40.1 34.0 - 46.6 %   MCV 90 79 - 97 fL   MCH 29.8 26.6 - 33.0 pg   MCHC 33.2 31.5 - 35.7 g/dL   RDW 11.7  11.7 - 15.4 %   Platelets 324 150 - 450 x10E3/uL   Neutrophils 48 Not Estab. %   Lymphs 43 Not Estab. %   Monocytes 6 Not Estab. %   Eos 2 Not Estab. %   Basos 1 Not Estab. %   Neutrophils Absolute 3.5 1.4 - 7.0 x10E3/uL   Lymphocytes Absolute 3.2 (H) 0.7 - 3.1 x10E3/uL   Monocytes Absolute 0.4 0.1 - 0.9 x10E3/uL   EOS (ABSOLUTE) 0.1 0.0 - 0.4 x10E3/uL   Basophils Absolute 0.1 0.0 - 0.2 x10E3/uL   Immature Granulocytes 0 Not Estab. %   Immature Grans (Abs) 0.0 0.0 - 0.1 x10E3/uL  Comprehensive metabolic panel  Result Value Ref Range   Glucose 81 70 - 99 mg/dL   BUN 5 (L) 6 - 20 mg/dL   Creatinine, Ser 0.63 0.57 - 1.00 mg/dL   eGFR 125 >59 mL/min/1.73   BUN/Creatinine Ratio 8 (L) 9 - 23   Sodium 141 134 - 144 mmol/L   Potassium 4.3 3.5 - 5.2 mmol/L   Chloride 104 96 - 106 mmol/L   CO2 23 20 - 29 mmol/L   Calcium 9.4 8.7 - 10.2 mg/dL   Total Protein 6.8 6.0 - 8.5 g/dL   Albumin 4.7 3.9 -  5.0 g/dL   Globulin, Total 2.1 1.5 - 4.5 g/dL   Albumin/Globulin Ratio 2.2 1.2 - 2.2   Bilirubin Total <0.2 0.0 - 1.2 mg/dL   Alkaline Phosphatase 68 44 - 121 IU/L   AST 23 0 - 40 IU/L   ALT 17 0 - 32 IU/L  Urinalysis, Routine w reflex microscopic  Result Value Ref Range   Specific Gravity, UA 1.020 1.005 - 1.030   pH, UA 7.0 5.0 - 7.5   Color, UA Yellow Yellow   Appearance Ur Clear Clear   Leukocytes,UA Negative Negative   Protein,UA Negative Negative/Trace   Glucose, UA Negative Negative   Ketones, UA Negative Negative   RBC, UA Negative Negative   Bilirubin, UA Negative Negative   Urobilinogen, Ur 0.2 0.2 - 1.0 mg/dL   Nitrite, UA Negative Negative  TSH  Result Value Ref Range   TSH 1.120 0.450 - 4.500 uIU/mL  Bayer DCA Hb A1c Waived (STAT)  Result Value Ref Range   HB A1C (BAYER DCA - WAIVED) 5.1 4.8 - 5.6 %        Current Outpatient Medications:    citalopram (CELEXA) 10 MG tablet, Take 1 tablet (10 mg total) by mouth daily. Take 1 pill daily for 2 weeks  Then increase to 2 pills daily and rtc, Disp: 30 tablet, Rfl: 1   hydrOXYzine (VISTARIL) 25 MG capsule, Take 1 capsule (25 mg total) by mouth every 8 (eight) hours as needed., Disp: 30 capsule, Rfl: 0   nicotine (NICODERM CQ) 21 mg/24hr patch, Place 1 patch (21 mg total) onto the skin daily., Disp: 28 patch, Rfl: 0    Assessment & Plan:  Anxiety :  Start pt on celexa 10 mgTake 1 pill daily for 2 weeks  Then increase to 2 pills daily and rtc  Use hydroxyzine for anxity / panic attacks.  TSH wnl Problem List Items Addressed This Visit       Other   Anxiety - Primary   Relevant Medications   hydrOXYzine (VISTARIL) 25 MG capsule   citalopram (CELEXA) 10 MG tablet   Other Relevant Orders   AMB Referral to [facility]     Orders Placed This Encounter  Procedures   AMB  Referral to [facility]     Meds ordered this encounter  Medications   hydrOXYzine (VISTARIL) 25 MG capsule     Sig: Take 1 capsule (25 mg total) by mouth every 8 (eight) hours as needed.    Dispense:  30 capsule    Refill:  0   citalopram (CELEXA) 10 MG tablet    Sig: Take 1 tablet (10 mg total) by mouth daily. Take 1 pill daily for 2 weeks  Then increase to 2 pills daily and rtc    Dispense:  30 tablet    Refill:  1     Follow up plan: Return in about 4 weeks (around 11/30/2021).

## 2021-11-03 MED ORDER — CITALOPRAM HYDROBROMIDE 10 MG PO TABS: 10.0000 mg | ORAL_TABLET | Freq: Every day | ORAL | 1 refills | Status: DC

## 2021-11-03 NOTE — Telephone Encounter (Signed)
Requested medication (s) are due for refill today:   Yes prescribed yesterday by Dr. Neomia Dear  Requested medication (s) are on the active medication list:   New rx  Future visit scheduled:   Yes   Last ordered: 11/02/2021  Returned because per pt Dr. Neomia Dear forgot to send the rx into the pharmacy.  Send to [id number].   Requested Prescriptions  Pending Prescriptions Disp Refills   citalopram (CELEXA) 10 MG tablet 30 tablet 1    Sig: Take 1 tablet (10 mg total) by mouth daily. Take 1 pill daily for 2 weeks  Then increase to 2 pills daily and rtc     Psychiatry:  Antidepressants - SSRI Passed - 11/02/2021  5:33 PM      Passed - Completed PHQ-2 or PHQ-9 in the last 360 days      Passed - Valid encounter within last 6 months    Recent Outpatient Visits           Yesterday [place] Vigg, Avanti, MD   4 days ago Syncope, unspecified syncope type   [facility] Vigg, Avanti, MD   3 years ago Acute midline low back pain with left-sided sciatica   [facility] Mikey [employer], NP   3 years ago Insect bite (nonvenomous) of breast, right breast, initial encounter   [facility] [employer]key [employer], NP   4 years ago Anxiety and depression   [place], Jerrel Ivory, NP       Future Appointments             In 2 weeks Vigg, Avanti, MD [facility] [place]

## 2021-11-06 ENCOUNTER — Other Ambulatory Visit: Payer: Medicaid Other | Admitting: *Deleted

## 2021-11-06 NOTE — Patient Instructions (Signed)
Visit Information  Ms. Steeber was given information about Medicaid Managed Care team care coordination services as a part of their [place] Medicaid benefit. Tonda Piccola Embry verbally consented to engagement with the [facility] Managed Care team.   If you are experiencing a medical emergency, please call 911 or report to your local emergency department or urgent care.   If you have a non-emergency medical problem during routine business hours, please contact your provider's office and ask to speak with a nurse.   For questions related to your [facility], please call: [phone] or visit the homepage here: [email or web address]  If you would like to schedule transportation through your [place], please call the following number at least 2 days in advance of your appointment: [phone].   Call the [place] at [phone], at any time, 24 hours a day, 7 days a week. If you are in danger or need immediate medical attention call 911.  If you would like help to quit smoking, call 1-800-QUIT-NOW 678-051-3535) OR Espaol: 1-855-Djelo-Ya HD:1601594) o para ms informacin haga clic aqu or Text READY to 200-400 to register via text  Ms. Holtzer - following are the goals we discussed in your visit today:   Goals Addressed             This Visit's Progress    Manage My Emotions       Timeframe:  Long-Range Goal Priority:  High Start Date:   11/06/21                          Expected End Date:                       Follow Up Date 11/09/21    - begin personal counseling - join a support group - talk about feelings with a friend, family or spiritual advisor - practice positive thinking and self-talk    Why is this important?   When you are stressed, down or upset, your body reacts too.  For example, your blood pressure may get higher;  you may have a headache or stomachache.  When your emotions get the best of you, your body's ability to fight off cold and flu gets weak.  These steps will help you manage your emotions.     Notes:         The patient verbalized understanding of instructions provided today and declined a print copy of patient instruction materials.   LCSW will assist patient in identifying a mental health therapist that specializes in the treatment of anxiety  [place], [place] [phone]   Following is a copy of your plan of care:  Care Plan : Anxiety (Adult)  Updates made by Vern Claude, LCSW since 11/06/2021 12:00 AM     Problem: Symptoms (Anxiety)      Goal: Anxiety Symptoms Monitored and Managed   Start Date: 11/06/2021  Priority: Medium  Note:   CARE PLAN ENTRY (see longitudinal plan of care for additional care plan information)  Current Barriers:  Knowledge deficits related to accessing mental health provider in patient with Anxiety  Patient is experiencing symptoms of  Anxiety/panic attacks which seem to be exacerbated when going visiting public places. Patient confirms fear of having a repeat seizure when going into public spaces and wants assistance with coping strategies Patient needs Support, Education, and Care Coordination in  order to meet unmet mental health needs  Mental Health Concerns   Clinical Social Work Goal(s):  Over the next 90 days, patient will work with SW bi-weekly by telephone or in person to reduce or manage symptoms of anxiety until connected for ongoing counseling resources.  Patient will implement clinical interventions discussed today to decreases symptoms of anxiety and increase knowledge and/or ability of: coping skills  Interventions:  Assessed patient's understanding, education, previous treatment and care coordination needs  Patient interviewed and appropriate assessments performed: PHQ 2 Provided basic mental health  support, education and interventions  Discussed  options for long term counseling based on need and insurance for assistance with coping mechanisms to assist with panic attacks.  Reviewed mental health medications with patient prescribed by PCP and discussed compliance  Encouraged patient to contact her provider regarding side effects concerns Other interventions include: Motivational Interviewing employed Depression screen reviewed  Brief Mindfulness or Relaxation training provided Active listening / Reflection utilized  Emotional Support Provided   Patient Self Care Activities & Deficits:  Patient is unable to independently navigate community resource options without care coordination support Patient is able to implement clinical interventions discussed today and is motivated for treatment  Patient will select one of the agencies from the list provided and call to schedule an appointment  Performs ADL's independently Performs IADL's independently Ability for insight Motivation for treatment Strong family or social support  Initial goal documentation     Task: Alleviate Barriers to Anxiety Treatment Success

## 2021-11-06 NOTE — Patient Outreach (Addendum)
Medicaid Managed Care Social Work Note  11/06/2021 Name:  Kimberly Mosley MRN:  [id number] DOB:  10-Apr-1996  Kimberly Mosley is an 26 y.o. year old female who is a primary patient of Vigg, Avanti, MD.  The Medicaid Managed Care Coordination team was consulted for assistance with:  [place] and Resources  Kimberly Mosley was given information about Medicaid Managed Care Coordination team services today. [place] Patient agreed to services and verbal consent obtained.  Engaged with patient  for by telephone forinitial visit in response to referral for case management and/or care coordination services.   Assessments/Interventions:  Review of past medical history, allergies, medications, health status, including review of consultants reports, laboratory and other test data, was performed as part of comprehensive evaluation and provision of chronic care management services.  SDOH: (Social Determinant of Health) assessments and interventions performed:   Advanced Directives Status:  Not addressed in this encounter.  Care Plan                 No Known Allergies  Medications Reviewed Today     Reviewed by Charlynne Cousins, MD (Physician) on 11/02/21 at 1510  Med List Status: <None>   Medication Order Taking? Sig Documenting Provider Last Dose Status Informant  citalopram (CELEXA) 10 MG tablet [id number] Yes Take 1 tablet (10 mg total) by mouth daily. Take 1 pill daily for 2 weeks  Then increase to 2 pills daily and rtc Vigg, Avanti, MD  Active    Patient not taking:   Discontinued 11/26/19 1233   hydrOXYzine (VISTARIL) 25 MG capsule [id number] Yes Take 1 capsule (25 mg total) by mouth every 8 (eight) hours as needed. Vigg, Avanti, MD  Active   nicotine (NICODERM CQ) 21 mg/24hr patch [id number] Yes Place 1 patch (21 mg total) onto the skin daily. Charlynne Cousins, MD Taking Active             Patient Active Problem List   Diagnosis Date Noted   Anxiety 11/02/2021   Acute mastitis  of left breast 12/24/2019   Class 1 obesity due to excess calories without serious comorbidity with body mass index (BMI) of 32.0 to 32.9 in adult 06/05/2018   Panic attack as reaction to stress 10/29/2017   Anxiety and depression 05/01/2017   Vaginal bleeding in pregnancy 09/13/2015    Conditions to be addressed/monitored per PCP order:  [place] : Anxiety (Adult)  Updates made by Vern Claude, LCSW since 11/06/2021 12:00 AM     Problem: Symptoms (Anxiety)      Goal: Anxiety Symptoms Monitored and Managed   Start Date: 11/06/2021  Priority: Medium  Note:   CARE PLAN ENTRY (see longitudinal plan of care for additional care plan information)  Current Barriers:  Knowledge deficits related to accessing mental health provider in patient with Anxiety  Patient is experiencing symptoms of  Anxiety/panic attacks which seem to be exacerbated when going visiting public places. Patient confirms fear of having a repeat seizure when going into public spaces and wants assistance with coping strategies Patient needs Support, Education, and Care Coordination in order to meet unmet mental health needs  Mental Health Concerns   Clinical Social Work Goal(s):  Over the next 90 days, patient will work with SW bi-weekly by telephone or in person to reduce or manage symptoms of anxiety until connected for ongoing counseling resources.  Patient will implement clinical interventions discussed today to decreases symptoms of anxiety and increase knowledge and/or ability  of: coping skills  Interventions:  Assessed patient's understanding, education, previous treatment and care coordination needs  Patient interviewed and appropriate assessments performed: PHQ 2 Provided basic mental health support, education and interventions  Discussed  options for long term counseling based on need and insurance for assistance with coping mechanisms to assist with panic attacks.  Reviewed mental health  medications with patient prescribed by PCP and discussed compliance  Encouraged patient to contact her provider regarding side effects concerns Other interventions include: Motivational Interviewing employed Depression screen reviewed  Brief Mindfulness or Relaxation training provided Active listening / Reflection utilized  Emotional Support Provided   Patient Self Care Activities & Deficits:  Patient is unable to independently navigate community resource options without care coordination support Patient is able to implement clinical interventions discussed today and is motivated for treatment  Patient will select one of the agencies from the list provided and call to schedule an appointment  Performs ADL's independently Performs IADL's independently Ability for insight Motivation for treatment Strong family or social support  Initial goal documentation     Task: Alleviate Barriers to Anxiety Treatment Success       Follow up:  Patient agrees to Care Plan and Follow-up.  Plan: The Managed Medicaid care management team will reach out to the patient again over the next 7-14 business day days.  Date/time of next scheduled Social Work care management/care coordination outreach:  11/09/21  Elliot Gurney, [place] Managed Medicaid [phone]

## 2021-11-09 ENCOUNTER — Other Ambulatory Visit: Payer: Medicaid Other | Admitting: *Deleted

## 2021-11-09 NOTE — Patient Instructions (Addendum)
Visit Information  Kimberly Mosley was given information about Medicaid Managed Care team care coordination services as a part of their [facility] Medicaid benefit. Kimberly Mosley verbally consented to engagement with the [facility] Managed Care team.   If you are experiencing a medical emergency, please call 911 or report to your local emergency department or urgent care.   If you have a non-emergency medical problem during routine business hours, please contact your provider's office and ask to speak with a nurse.   For questions related to your [facility], please call: [phone] or visit the homepage here: [email or web address]  If you would like to schedule transportation through your [facility], please call the following number at least 2 days in advance of your appointment: [phone].   Call the Behavioral Health Crisis Line at [phone], at any time, 24 hours a day, 7 days a week. If you are in danger or need immediate medical attention call 911.  If you would like help to quit smoking, call 1-800-QUIT-NOW (604-381-0191) OR Espaol: 1-855-Djelo-Ya (9-622-297-9892) o para ms informacin haga clic aqu or Text READY to 119-417 to register via text  Kimberly Mosley - following are the goals we discussed in your visit today:   Goals Addressed             This Visit's Progress    Manage My Emotions       Timeframe:  Long-Range Goal Priority:  High Start Date:   11/06/21                          Expected End Date:                       Follow Up Date 12/11/21    - begin personal counseling - join a support group - talk about feelings with a friend, family or spiritual advisor - practice positive thinking and self-talk  -Follow up with referral with [facility] for ongoing mental health counseling   Why is this important?   When you are stressed, down  or upset, your body reacts too.  For example, your blood pressure may get higher; you may have a headache or stomachache.  When your emotions get the best of you, your body's ability to fight off cold and flu gets weak.  These steps will help you manage your emotions.     Notes:         Patient                                              will follow up with [facility] once paired with a therapist.  Kimberly Czech, LCSW Managed [facility]  [phone]   Following is a copy of your plan of care:  Care Plan : Anxiety (Adult)  Updates made by Wenda Overland, LCSW since 11/09/2021 12:00 AM     Problem: Symptoms (Anxiety)      Goal: Anxiety Symptoms Monitored and Managed   Start Date: 11/06/2021  Priority: Medium  Note:   CARE PLAN ENTRY (see longitudinal plan of care for additional care plan information)  Current Barriers:  Knowledge deficits related to accessing mental health provider in patient with Anxiety  Patient is experiencing symptoms of  Anxiety/panic attacks which seem  to be exacerbated when going visiting public places. Patient confirms fear of having a repeat seizure when going into public spaces and wants assistance with coping strategies Patient needs Support, Education, and Care Coordination in order to meet unmet mental health needs  Mental Health Concerns   Clinical Social Work Goal(s):  Over the next 90 days, patient will work with SW bi-weekly by telephone or in person to reduce or manage symptoms of anxiety until connected for ongoing counseling resources.  Patient will implement clinical interventions discussed today to decreases symptoms of anxiety and increase knowledge and/or ability of: coping skills  Interventions:  Assessed patient's understanding, education, previous treatment and care coordination needs  Confirmed with patient that referral completed with [facility]-match with therapist  pending Patient confirmed improvement in  anxiety symptoms, no panic attacks since last call-patient confirmed going to the park and [employer] and experienced no panic episodes Patient confirmed changing her perspective-using positive self talk  Provided basic mental health support, education and interventions  Patient provided with validation and positive reinforcement for positive coping strategies utilized  and motivation for mental health follow up Other interventions include: Motivational Interviewing employed Active listening / Reflection utilized  Industrial/product designer Provided   Patient Self Care Activities & Deficits:  Patient is unable to independently navigate community resource options without care coordination support Patient is able to implement clinical interventions discussed today and is motivated for treatment  Patient will select one of the agencies from the list provided and call to schedule an appointment  Performs ADL's independently Performs IADL's independently Ability for insight Motivation for treatment Strong family or social support  Please see past updates related to this goal by clicking on the "Past Updates" button in the selected goal

## 2021-11-09 NOTE — Patient Outreach (Addendum)
Medicaid Managed Care Social Work Note  11/09/2021 Name:  Kimberly Mosley MRN:  [id number] DOB:  03-16-1996  Kimberly Mosley is an 26 y.o. year old female who is a primary patient of Vigg, Avanti, MD.  The Medicaid Managed Care Coordination team was consulted for assistance with:  Mental Health Counseling and Resources  Kimberly Mosley was given information about Medicaid Managed Care Coordination team services today. Kimberly Mosley Patient agreed to services and verbal consent obtained.  Engaged with patient  for by telephone forfollow up visit in response to referral for case management and/or care coordination services.   Assessments/Interventions:  Review of past medical history, allergies, medications, health status, including review of consultants reports, laboratory and other test data, was performed as part of comprehensive evaluation and provision of chronic care management services.  SDOH: (Social Determinant of Health) assessments and interventions performed:   Advanced Directives Status:  Not addressed in this encounter.  Care Plan                 No Known Allergies  Medications Reviewed Today     Reviewed by Kimberly Pardon, MD (Physician) on 11/02/21 at 1510  Med List Status: <None>   Medication Order Taking? Sig Documenting Provider Last Dose Status Informant  citalopram (CELEXA) 10 MG tablet [id number] Yes Take 1 tablet (10 mg total) by mouth daily. Take 1 pill daily for 2 weeks  Then increase to 2 pills daily and rtc Vigg, Avanti, MD  Active    Patient not taking:   Discontinued 11/26/19 1233   hydrOXYzine (VISTARIL) 25 MG capsule [id number] Yes Take 1 capsule (25 mg total) by mouth every 8 (eight) hours as needed. Vigg, Avanti, MD  Active   nicotine (NICODERM CQ) 21 mg/24hr patch [id number] Yes Place 1 patch (21 mg total) onto the skin daily. Kimberly Pardon, MD Taking Active             Patient Active Problem List   Diagnosis Date Noted   Anxiety 11/02/2021   Acute  mastitis of left breast 12/24/2019   Class 1 obesity due to excess calories without serious comorbidity with body mass index (BMI) of 32.0 to 32.9 in adult 06/05/2018   Panic attack as reaction to stress 10/29/2017   Anxiety and depression 05/01/2017   Vaginal bleeding in pregnancy 09/13/2015    Conditions to be addressed/monitored per PCP order:  Anxiety  Care Plan : Anxiety (Adult)  Updates made by Wenda Overland, LCSW since 11/09/2021 12:00 AM     Problem: Symptoms (Anxiety)      Goal: Anxiety Symptoms Monitored and Managed   Start Date: 11/06/2021  Priority: Medium  Note:   CARE PLAN ENTRY (see longitudinal plan of care for additional care plan information)  Current Barriers:  Knowledge deficits related to accessing mental health provider in patient with Anxiety  Patient is experiencing symptoms of  Anxiety/panic attacks which seem to be exacerbated when going visiting public places. Patient confirms fear of having a repeat seizure when going into public spaces and wants assistance with coping strategies Patient needs Support, Education, and Care Coordination in order to meet unmet mental health needs  Mental Health Concerns   Clinical Social Work Goal(s):  Over the next 90 days, patient will work with SW bi-weekly by telephone or in person to reduce or manage symptoms of anxiety until connected for ongoing counseling resources.  Patient will implement clinical interventions discussed today to decreases symptoms of anxiety and increase knowledge and/or  ability of: coping skills  Interventions:  Assessed patient's understanding, education, previous treatment and care coordination needs  Confirmed with patient that referral completed with [facility]-match with therapist  pending Patient confirmed improvement in anxiety symptoms, no panic attacks since last call-patient confirmed going to the park and [place] and experienced no panic episodes Patient confirmed changing her  perspective-using positive self talk  Provided basic mental health support, education and interventions  Patient provided with validation and positive reinforcement for positive coping strategies utilized  and motivation for mental health follow up Other interventions include: Motivational Interviewing employed Active listening / Reflection utilized  Industrial/product designer Provided   Patient Self Care Activities & Deficits:  Patient is unable to independently navigate community resource options without care coordination support Patient is able to implement clinical interventions discussed today and is motivated for treatment  Patient will select one of the agencies from the list provided and call to schedule an appointment  Performs ADL's independently Performs IADL's independently Ability for insight Motivation for treatment Strong family or social support  Please see past updates related to this goal by clicking on the "Past Updates" button in the selected goal       Follow up:  Patient agrees to Care Plan and Follow-up.  Plan: The Managed Medicaid care management team will reach out to the patient again over the next 14 business days.  Date/time of next scheduled Social Work care management/care coordination outreach:  11/23/21  Adriana Reams Managed Medicaid [phone]

## 2021-11-13 ENCOUNTER — Ambulatory Visit: Payer: Medicaid Other | Admitting: Internal Medicine

## 2021-11-15 ENCOUNTER — Encounter: Payer: Self-pay | Admitting: *Deleted

## 2021-11-15 ENCOUNTER — Other Ambulatory Visit: Payer: Self-pay

## 2021-11-15 NOTE — Telephone Encounter (Signed)
This encounter was created in error - please disregard.

## 2021-11-15 NOTE — Patient Instructions (Signed)
Visit Information  Kimberly Mosley was given information about Medicaid Managed Care team care coordination services as a part of their [place] Medicaid benefit. Kimberly Mosley verbally consented to engagement with the [facility] Managed Care team.   If you are experiencing a medical emergency, please call 911 or report to your local emergency department or urgent care.   If you have a non-emergency medical problem during routine business hours, please contact your provider's office and ask to speak with a nurse.   For questions related to your [facility], please call: [phone] or visit the homepage here: [email or web address]  If you would like to schedule transportation through your [facility], please call the following number at least 2 days in advance of your appointment: [phone].   Call the [place] at [phone], at any time, 24 hours a day, 7 days a week. If you are in danger or need immediate medical attention call 911.  If you would like help to quit smoking, call 1-800-QUIT-NOW 757-404-6210) OR Espaol: 1-855-Djelo-Ya HD:1601594) o para ms informacin haga clic aqu or Text READY to 200-400 to register via text  Kimberly Mosley - following are the goals we discussed in your visit today:   Goals Addressed             This Visit's Progress    Manage My Emotions       Timeframe:  Long-Range Goal Priority:  High Start Date:   11/06/21                          Expected End Date:                       Follow Up Date 12/11/21    - begin personal counseling - join a support group - talk about feelings with a friend, family or spiritual advisor - practice positive thinking and self-talk  -Complete assessment with [place] for match to a therapist for ongoing mental health counseling   Why is this important?   When  you are stressed, down or upset, your body reacts too.  For example, your blood pressure may get higher; you may have a headache or stomachache.  When your emotions get the best of you, your body's ability to fight off cold and flu gets weak.  These steps will help you manage your emotions.     Notes:           The patient verbalized understanding of instructions provided today and declined a print copy of patient instruction materials.   Patient                                              will complete assessment for [facility] and once complete with be matched to a mental health provider.  Kimberly Mosley, [name] Managed Medicaid social worker [phone]   Following is a copy of your plan of care:  Care Plan : Anxiety (Adult)  Updates made by Vern Claude, LCSW since 11/15/2021 12:00 AM     Problem: Symptoms (Anxiety)      Goal: Anxiety Symptoms Monitored and Managed   Start Date: 11/06/2021  Priority: Medium  Note:   CARE PLAN ENTRY (see longitudinal plan of care for additional  care plan information)  Current Barriers:  Knowledge deficits related to accessing mental health provider in patient with Anxiety  Patient is experiencing symptoms of  Anxiety/panic attacks which seem to be exacerbated when going visiting public places. Patient confirms fear of having a repeat seizure when going into public spaces and wants assistance with coping strategies Patient needs Support, Education, and Care Coordination in order to meet unmet mental health needs  Mental Health Concerns   Clinical Social Work Goal(s):  Over the next 90 days, patient will work with SW bi-weekly by telephone or in person to reduce or manage symptoms of anxiety until connected for ongoing counseling resources.  Patient will implement clinical interventions discussed today to decreases symptoms of anxiety and increase knowledge and/or ability of: coping skills  Interventions:  Assessed patient's  understanding, education, previous treatment and care coordination needs  Confirmed with patient that referral completed with [facility]-match with therapist  pending Patient confirmed improvement in anxiety symptoms, no panic attacks since last call-patient confirmed going to the park and [employer] and experienced no panic episodes Patient confirmed changing her perspective-using positive self talk  Provided basic mental health support, education and interventions  Patient provided with validation and positive reinforcement for positive coping strategies utilized  and motivation for mental health follow up Other interventions include: Motivational Interviewing employed Active listening / Reflection utilized  Emotional Support Provided  11/15/21 Update Phone call to patient to follow up on referral for ongoing mental health follow up. Per patient, she has had no panic attacks since last call , per patient doing well. Patient states that she has not heard from [facility], however was able to view the e-mail sent from them with assistance. She will need to complete the assessment before a match is identified. Patient agreeable to doing this so that ongoing therapy can be set up.  Patient Self Care Activities & Deficits:  Patient is unable to independently navigate community resource options without care coordination support Patient is able to implement clinical interventions discussed today and is motivated for treatment  Patient will select one of the agencies from the list provided and call to schedule an appointment  Performs ADL's independently Performs IADL's independently Ability for insight Motivation for treatment Strong family or social support  Please see past updates related to this goal by clicking on the "Past Updates" button in the selected goal

## 2021-11-15 NOTE — Patient Outreach (Signed)
Medicaid Managed Care Social Work Note  11/15/2021 Name:  Kimberly SALVATIERRA MRN:  [id number] DOB:  1996/04/13  Kimberly Mosley is an 26 y.o. year old female who is a primary patient of Vigg, Avanti, MD.  The Medicaid Managed Care Coordination team was consulted for assistance with:  Mental Health Counseling and Resources  Ms. Willinger was given information about Medicaid Managed Care Coordination team services today. Kimberly Mosley Patient agreed to services and verbal consent obtained.  Engaged with patient  for by telephone forfollow up visit in response to referral for case management and/or care coordination services.   Assessments/Interventions:  Review of past medical history, allergies, medications, health status, including review of consultants reports, laboratory and other test data, was performed as part of comprehensive evaluation and provision of chronic care management services.  SDOH: (Social Determinant of Health) assessments and interventions performed:   Advanced Directives Status:  Not addressed in this encounter.  Care Plan                 No Known Allergies  Medications Reviewed Today     Reviewed by Loura Pardon, MD (Physician) on 11/02/21 at 1510  Med List Status: <None>   Medication Order Taking? Sig Documenting Provider Last Dose Status Informant  citalopram (CELEXA) 10 MG tablet [id number] Yes Take 1 tablet (10 mg total) by mouth daily. Take 1 pill daily for 2 weeks  Then increase to 2 pills daily and rtc Vigg, Avanti, MD  Active    Patient not taking:   Discontinued 11/26/19 1233   hydrOXYzine (VISTARIL) 25 MG capsule [id number] Yes Take 1 capsule (25 mg total) by mouth every 8 (eight) hours as needed. Vigg, Avanti, MD  Active   nicotine (NICODERM CQ) 21 mg/24hr patch [id number] Yes Place 1 patch (21 mg total) onto the skin daily. Loura Pardon, MD Taking Active             Patient Active Problem List   Diagnosis Date Noted   Anxiety 11/02/2021   Acute  mastitis of left breast 12/24/2019   Class 1 obesity due to excess calories without serious comorbidity with body mass index (BMI) of 32.0 to 32.9 in adult 06/05/2018   Panic attack as reaction to stress 10/29/2017   Anxiety and depression 05/01/2017   Vaginal bleeding in pregnancy 09/13/2015    Conditions to be addressed/monitored per PCP order:  Anxiety  Care Plan : Anxiety (Adult)  Updates made by Wenda Overland, LCSW since 11/15/2021 12:00 AM     Problem: Symptoms (Anxiety)      Goal: Anxiety Symptoms Monitored and Managed   Start Date: 11/06/2021  Priority: Medium  Note:   CARE PLAN ENTRY (see longitudinal plan of care for additional care plan information)  Current Barriers:  Knowledge deficits related to accessing mental health provider in patient with Anxiety  Patient is experiencing symptoms of  Anxiety/panic attacks which seem to be exacerbated when going visiting public places. Patient confirms fear of having a repeat seizure when going into public spaces and wants assistance with coping strategies Patient needs Support, Education, and Care Coordination in order to meet unmet mental health needs  Mental Health Concerns   Clinical Social Work Goal(s):  Over the next 90 days, patient will work with SW bi-weekly by telephone or in person to reduce or manage symptoms of anxiety until connected for ongoing counseling resources.  Patient will implement clinical interventions discussed today to decreases symptoms of anxiety and increase knowledge and/or  ability of: coping skills  Interventions:  Assessed patient's understanding, education, previous treatment and care coordination needs  Confirmed with patient that referral completed with [facility]-match with therapist  pending Patient confirmed improvement in anxiety symptoms, no panic attacks since last call-patient confirmed going to the park and [place] and experienced no panic episodes Patient confirmed changing her  perspective-using positive self talk  Provided basic mental health support, education and interventions  Patient provided with validation and positive reinforcement for positive coping strategies utilized  and motivation for mental health follow up Other interventions include: Motivational Interviewing employed Active listening / Reflection utilized  Emotional Support Provided  11/15/21 Update Phone call to patient to follow up on referral for ongoing mental health follow up. Per patient, she has had no panic attacks since last call , per patient doing well. Patient states that she has not heard from [facility], however was able to view the e-mail sent from them with assistance. She will need to complete the assessment before a match is identified. Patient agreeable to doing this so that ongoing therapy can be set up.  Patient Self Care Activities & Deficits:  Patient is unable to independently navigate community resource options without care coordination support Patient is able to implement clinical interventions discussed today and is motivated for treatment  Patient will select one of the agencies from the list provided and call to schedule an appointment  Performs ADL's independently Performs IADL's independently Ability for insight Motivation for treatment Strong family or social support  Please see past updates related to this goal by clicking on the "Past Updates" button in the selected goal       Follow up:  Patient agrees to Care Plan and Follow-up.  Plan: The Managed Medicaid care management team will reach out to the patient again over the next 30 business  days.  Date/time of next scheduled Social Work care management/care coordination outreach:  11/29/21  Verna Czech, LCSW Managed [facility] Social Worker [phone]

## 2021-11-23 ENCOUNTER — Encounter: Payer: Self-pay | Admitting: Internal Medicine

## 2021-11-23 ENCOUNTER — Ambulatory Visit (INDEPENDENT_AMBULATORY_CARE_PROVIDER_SITE_OTHER): Payer: Medicaid Other | Admitting: Internal Medicine

## 2021-11-23 ENCOUNTER — Other Ambulatory Visit: Payer: Self-pay

## 2021-11-23 VITALS — BP 109/70 | HR 80 | Temp 98.1°F | Ht 62.21 in | Wt 153.0 lb

## 2021-11-23 DIAGNOSIS — F419 Anxiety disorder, unspecified: Secondary | ICD-10-CM | POA: Diagnosis not present

## 2021-11-23 DIAGNOSIS — Z124 Encounter for screening for malignant neoplasm of cervix: Secondary | ICD-10-CM

## 2021-11-23 NOTE — Progress Notes (Signed)
BP 109/70    Pulse 80    Temp 98.1 F (36.7 C) (Oral)    Ht 5' 2.21" (1.58 m)    Wt 153 lb (69.4 kg)    SpO2 98%    BMI 27.80 kg/m    Subjective:    Patient ID: Kimberly Mosley, female    DOB: June 02, 1996, 26 y.o.   MRN: [id number]  Chief Complaint  Patient presents with   Anxiety    HPI: Kimberly Mosley is a 26 y.o. female  Anxiety Presents for initial (hydroxyzine - made her drowsy, took celexa and says her HR was high.) visit. Onset was in the past 7 days (better without meds. has been doing more breating excercises. avoiding caffeine per pt.). Patient reports no chest pain, compulsions, confusion, feeling of choking, irritability, malaise, muscle tension or nausea. Primary symptoms comment: trying to get off her smoking bveen smoking since she was 16 x 1/2 ppd.     Chief Complaint  Patient presents with   Anxiety    Relevant past medical, surgical, family and social history reviewed and updated as indicated. Interim medical history since our last visit reviewed. Allergies and medications reviewed and updated.  Review of Systems  Constitutional:  Negative for irritability.  Cardiovascular:  Negative for chest pain.  Gastrointestinal:  Negative for nausea.  Psychiatric/Behavioral:  Negative for confusion.    Per HPI unless specifically indicated above     Objective:    BP 109/70    Pulse 80    Temp 98.1 F (36.7 C) (Oral)    Ht 5' 2.21" (1.58 m)    Wt 153 lb (69.4 kg)    SpO2 98%    BMI 27.80 kg/m   Wt Readings from Last 3 Encounters:  11/23/21 153 lb (69.4 kg)  11/02/21 151 lb 9.6 oz (68.8 kg)  10/30/21 150 lb 3.2 oz (68.1 kg)    Physical Exam Vitals and nursing note reviewed.  Constitutional:      General: She is not in acute distress.    Appearance: Normal appearance. She is not ill-appearing or diaphoretic.  Eyes:     Conjunctiva/sclera: Conjunctivae normal.  Pulmonary:     Breath sounds: No rhonchi.  Abdominal:     General: There is no distension.      Palpations: Abdomen is soft. There is no mass.     Tenderness: There is no abdominal tenderness. There is no guarding.  Skin:    General: Skin is warm and dry.     Coloration: Skin is not jaundiced.     Findings: No erythema.  Neurological:     Mental Status: She is alert.    Results for orders placed or performed in visit on 10/30/21  CBC with Differential/Platelet  Result Value Ref Range   WBC 7.3 3.4 - 10.8 x10E3/uL   RBC 4.47 3.77 - 5.28 x10E6/uL   Hemoglobin 13.3 11.1 - 15.9 g/dL   Hematocrit 40.1 34.0 - 46.6 %   MCV 90 79 - 97 fL   MCH 29.8 26.6 - 33.0 pg   MCHC 33.2 31.5 - 35.7 g/dL   RDW 11.7 11.7 - 15.4 %   Platelets 324 150 - 450 x10E3/uL   Neutrophils 48 Not Estab. %   Lymphs 43 Not Estab. %   Monocytes 6 Not Estab. %   Eos 2 Not Estab. %   Basos 1 Not Estab. %   Neutrophils Absolute 3.5 1.4 - 7.0 x10E3/uL   Lymphocytes Absolute 3.2 (H)  0.7 - 3.1 x10E3/uL   Monocytes Absolute 0.4 0.1 - 0.9 x10E3/uL   EOS (ABSOLUTE) 0.1 0.0 - 0.4 x10E3/uL   Basophils Absolute 0.1 0.0 - 0.2 x10E3/uL   Immature Granulocytes 0 Not Estab. %   Immature Grans (Abs) 0.0 0.0 - 0.1 x10E3/uL  Comprehensive metabolic panel  Result Value Ref Range   Glucose 81 70 - 99 mg/dL   BUN 5 (L) 6 - 20 mg/dL   Creatinine, Ser 0.63 0.57 - 1.00 mg/dL   eGFR 125 >59 mL/min/1.73   BUN/Creatinine Ratio 8 (L) 9 - 23   Sodium 141 134 - 144 mmol/L   Potassium 4.3 3.5 - 5.2 mmol/L   Chloride 104 96 - 106 mmol/L   CO2 23 20 - 29 mmol/L   Calcium 9.4 8.7 - 10.2 mg/dL   Total Protein 6.8 6.0 - 8.5 g/dL   Albumin 4.7 3.9 - 5.0 g/dL   Globulin, Total 2.1 1.5 - 4.5 g/dL   Albumin/Globulin Ratio 2.2 1.2 - 2.2   Bilirubin Total <0.2 0.0 - 1.2 mg/dL   Alkaline Phosphatase 68 44 - 121 IU/L   AST 23 0 - 40 IU/L   ALT 17 0 - 32 IU/L  Urinalysis, Routine w reflex microscopic  Result Value Ref Range   Specific Gravity, UA 1.020 1.005 - 1.030   pH, UA 7.0 5.0 - 7.5   Color, UA Yellow Yellow   Appearance Ur  Clear Clear   Leukocytes,UA Negative Negative   Protein,UA Negative Negative/Trace   Glucose, UA Negative Negative   Ketones, UA Negative Negative   RBC, UA Negative Negative   Bilirubin, UA Negative Negative   Urobilinogen, Ur 0.2 0.2 - 1.0 mg/dL   Nitrite, UA Negative Negative  TSH  Result Value Ref Range   TSH 1.120 0.450 - 4.500 uIU/mL  Bayer DCA Hb A1c Waived (STAT)  Result Value Ref Range   HB A1C (BAYER DCA - WAIVED) 5.1 4.8 - 5.6 %       No current outpatient medications on file.    Assessment & Plan:  Anxiety :  Start pt on celexa 10 mgTake 1 pill daily for 2 weeks  Then increase to 2 pills daily and rtc  Use hydroxyzine for anxity / panic attacks.  TSH wnl Fu with therapist and possibly psychiatrist. Has been speaking to Education officer, museum as well.   Problem List Items Addressed This Visit       Other   Anxiety - Primary   Relevant Orders   Ambulatory referral to Psychiatry   Other Visit Diagnoses     Pap smear for cervical cancer screening       Relevant Orders   Ambulatory referral to Obstetrics / Gynecology        Orders Placed This Encounter  Procedures   Ambulatory referral to Psychiatry   Ambulatory referral to Obstetrics / Gynecology     No orders of the defined types were placed in this encounter.    Follow up plan: Return in about 1 year (around 11/23/2022).  Health Maintenance  Paps smear: to see obgyn for such.

## 2021-11-29 ENCOUNTER — Other Ambulatory Visit: Payer: Self-pay | Admitting: *Deleted

## 2021-11-29 NOTE — Patient Instructions (Addendum)
Visit Information  Kimberly Mosley was given information about Medicaid Managed Care team care coordination services as a part of their [facility] Community Plan Medicaid benefit. Kimberly Mosley verbally consented to engagement with the [facility] Managed Care team.   If you are experiencing a medical emergency, please call 911 or report to your local emergency department or urgent care.   If you have a non-emergency medical problem during routine business hours, please contact your provider's office and ask to speak with a nurse.   For questions related to your [facility], please call: [phone] or visit the homepage here: [email or web address]  If you would like to schedule transportation through your [facility], please call the following number at least 2 days in advance of your appointment: [phone].   Call the Behavioral Health Crisis Line at [phone], at any time, 24 hours a day, 7 days a week. If you are in danger or need immediate medical attention call 911.  If you would like help to quit smoking, call 1-800-QUIT-NOW (901-652-7739) OR Espaol: 1-855-Djelo-Ya (1-962-229-7989) o para ms informacin haga clic aqu or Text READY to 211-941 to register via text  Kimberly Mosley - following are the goals we discussed in your visit today:   Goals Addressed             This Visit's Progress    Manage My Emotions       Timeframe:  Long-Range Goal Priority:  High Start Date:   11/06/21                          Expected End Date:                       Follow Up Date 12/13/21    - begin personal counseling - join a support group - talk about feelings with a friend, family or spiritual advisor - practice positive thinking and self-talk  -Please call [facility] [phone] to schedule intake appointment for ongoing counseling -Please complete and return intake paper  work-once received appointment can be made   Why is this important?   When you are stressed, down or upset, your body reacts too.  For example, your blood pressure may get higher; you may have a headache or stomachache.  When your emotions get the best of you, your body's ability to fight off cold and flu gets weak.  These steps will help you manage your emotions.     Notes:         The patient verbalized understanding of instructions provided today and declined a print copy of patient instruction materials.   The Patient will call [facility] and [facility] as advised to schedule initial appointment once new patient paper work is completed.   Verna Czech, [place] Managed [facility] Social Worker [phone]

## 2021-11-29 NOTE — Patient Outreach (Addendum)
Medicaid Managed Care Social Work Note  11/29/2021 Name:  Kimberly Mosley MRN:  [id number] DOB:  04-10-1996  Clarene Mosley is an 26 y.o. year old female who is a primary patient of Vigg, Avanti, MD.  The Medicaid Managed Care Coordination team was consulted for assistance with:  Mental Health Counseling and Resources  Ms. Shubert was given information about Medicaid Managed Care Coordination team services today. Kimberly Mosley Patient agreed to services and verbal consent obtained.  Engaged with patient  for by telephone forfollow up visit in response to referral for case management and/or care coordination services.   Assessments/Interventions:  Review of past medical history, allergies, medications, health status, including review of consultants reports, laboratory and other test data, was performed as part of comprehensive evaluation and provision of chronic care management services.  SDOH: (Social Determinant of Health) assessments and interventions performed:   Advanced Directives Status:  Not addressed in this encounter.  Care Plan                 No Known Allergies  Medications Reviewed Today     Reviewed by Loura Pardon, MD (Physician) on 11/23/21 at 1559  Med List Status: <None>   Medication Order Taking? Sig Documenting Provider Last Dose Status Informant   Patient not taking:   Discontinued 11/26/19 1233             Patient Active Problem List   Diagnosis Date Noted   Anxiety 11/02/2021   Acute mastitis of left breast 12/24/2019   Class 1 obesity due to excess calories without serious comorbidity with body mass index (BMI) of 32.0 to 32.9 in adult 06/05/2018   Panic attack as reaction to stress 10/29/2017   Anxiety and depression 05/01/2017   Vaginal bleeding in pregnancy 09/13/2015    Conditions to be addressed/monitored per PCP order:  Anxiety  Care Plan : Anxiety (Adult)  Updates made by Wenda Overland, LCSW since 11/29/2021 12:00 AM     Problem:  Symptoms (Anxiety)      Goal: Anxiety Symptoms Monitored and Managed   Start Date: 11/06/2021  Priority: Medium  Note:   CARE PLAN ENTRY (see longitudinal plan of care for additional care plan information)  Current Barriers:  Knowledge deficits related to accessing mental health provider in patient with Anxiety  Patient is experiencing symptoms of  Anxiety/panic attacks which seem to be exacerbated when going visiting public places. Patient confirms fear of having a repeat seizure when going into public spaces and wants assistance with coping strategies Patient needs Support, Education, and Care Coordination in order to meet unmet mental health needs  Mental Health Concerns   Clinical Social Work Goal(s):  Over the next 90 days, patient will work with SW bi-weekly by telephone or in person to reduce or manage symptoms of anxiety until connected for ongoing counseling resources.  Patient will implement clinical interventions discussed today to decreases symptoms of anxiety and increase knowledge and/or ability of: coping skills  Interventions:  Assessed patient's understanding, education, previous treatment and care coordination needs  Confirmed with patient that the referral was completed with [facility]-[phone] patient will now need to call and schedule initial intake appointment after new patient paper work is completed Patient also referred to psychiatry, patient will need to complete new patient packet before appointment can be made Patient confirmed improvement in anxiety symptoms, no panic attacks since last call evidenced by increased ability to participate in social situations Patient confirmed changing her perspective-using positive  self talk  Patient provided with validation and positive reinforcement for positive coping strategies utilized  and motivation for mental health follow up Other interventions include: Motivational Interviewing employed Active listening /  Reflection utilized  Industrial/product designer Provided  Patient Self Care Activities & Deficits:  Patient is unable to independently navigate community resource options without care coordination support Patient is able to implement clinical interventions discussed today and is motivated for treatment  Patient will select one of the agencies from the list provided and call to schedule an appointment  Performs ADL's independently Performs IADL's independently Ability for insight Motivation for treatment Strong family or social support  Please see past updates related to this goal by clicking on the "Past Updates" button in the selected goal       Follow up:  Patient agrees to Care Plan and Follow-up.  Plan: The Managed Medicaid care management team will reach out to the patient again over the next 14 business days.  Date/time of next scheduled Social Work care management/care coordination outreach:  12/13/21  Verna Czech, LCSW Managed [facility] Social Worker [phone]

## 2021-12-13 ENCOUNTER — Telehealth: Payer: Self-pay | Admitting: *Deleted

## 2021-12-13 ENCOUNTER — Ambulatory Visit: Payer: Self-pay

## 2021-12-13 NOTE — Patient Outreach (Signed)
Care Coordination  12/13/2021  Kimberly Mosley [birth year] [id number]   Care Management   Follow Up Note   12/13/2021 Name: Kimberly Mosley MRN: [id number] DOB: February 02, 1996   Referred by: Loura Pardon, MD Reason for referral : Community Resources and [employer]   An unsuccessful telephone outreach was attempted today. The patient was referred to the case management team for assistance with care management and care coordination.   Follow Up Plan: Telephone follow up appointment with care management team member to be re-scheduled by Careguide:  Verna Czech, LCSW Managed [facility] Social Worker [phone]

## 2021-12-14 ENCOUNTER — Telehealth: Payer: Self-pay | Admitting: *Deleted

## 2021-12-14 NOTE — Patient Outreach (Signed)
Care Coordination  12/14/2021  SOLYANA NONAKA 1995-11-04 [id number]   Care Management   Follow Up Note   12/14/2021 Name: Kimberly Mosley MRN: [id number] DOB: July 14, 1996   Referred by: Loura Pardon, MD Reason for referral : Care Coordination   A second unsuccessful telephone outreach was attempted today. The patient was referred to the case management team for assistance with care management and care coordination.   Follow Up Plan: Telephone follow up appointment with care management team member to be re-scheduled by Careguide.  Verna Czech, [place] Managed [facility] Social Worker [phone]

## 2021-12-26 ENCOUNTER — Telehealth: Payer: Self-pay | Admitting: *Deleted

## 2021-12-26 NOTE — Chronic Care Management (AMB) (Signed)
?  Care Management  ? ?12/26/2021 ?Name: Kimberly Mosley MRN: [id number] DOB: 1996-07-19 ? ?Referred by: Charlynne Cousins, MD ?Reason for referral : High Risk Managed Medicaid (Second outreach to reschedule missed follow up call with Licensed Clinical SW ) ? ? ?A second unsuccessful telephone outreach was attempted today. The patient was referred to the case management team for assistance with care management and care coordination.   ? ?Follow Up Plan: A HIPAA compliant phone message was left for the patient providing contact information and requesting a return call. and The Managed Medicaid care management team will reach out to the patient again over the next 7 days.  ? ? ?Laverda Sorenson  ?Care Guide, Embedded Care Coordination  High Risk Medicaid Managed Care  ?[place]  ?Direct Dial: [phone]  ?

## 2022-01-09 ENCOUNTER — Encounter: Payer: Self-pay | Admitting: Neurology

## 2022-01-09 ENCOUNTER — Ambulatory Visit: Payer: Medicaid Other | Admitting: Neurology

## 2022-01-17 ENCOUNTER — Other Ambulatory Visit (HOSPITAL_COMMUNITY)
Admission: RE | Admit: 2022-01-17 | Discharge: 2022-01-17 | Disposition: A | Discharge status: Home / Self Care | Payer: Medicaid Other | Source: Ambulatory Visit | Attending: Obstetrics | Admitting: Obstetrics

## 2022-01-17 ENCOUNTER — Ambulatory Visit (INDEPENDENT_AMBULATORY_CARE_PROVIDER_SITE_OTHER): Payer: Medicaid Other | Admitting: Obstetrics

## 2022-01-17 ENCOUNTER — Encounter: Payer: Self-pay | Admitting: Obstetrics

## 2022-01-17 VITALS — BP 114/74 | HR 72 | Wt 154.0 lb

## 2022-01-17 DIAGNOSIS — Z124 Encounter for screening for malignant neoplasm of cervix: Secondary | ICD-10-CM | POA: Insufficient documentation

## 2022-01-17 DIAGNOSIS — Z01419 Encounter for gynecological examination (general) (routine) without abnormal findings: Secondary | ICD-10-CM | POA: Diagnosis not present

## 2022-01-17 DIAGNOSIS — Z0000 Encounter for general adult medical examination without abnormal findings: Secondary | ICD-10-CM

## 2022-01-17 DIAGNOSIS — N6002 Solitary cyst of left breast: Secondary | ICD-10-CM

## 2022-01-17 NOTE — Progress Notes (Signed)
SUBJECTIVE ? ?HPI ? ?Kimberly Mosley is a 26 y.o.-year-old female who presents for an annual physical today and Pap smear today. She does use any contraception and is fine with a pregnancy if it happens. She would also like ultrasound surveillance of a cyst she had drained on her breast 2 years ago. She reports some nipple tenderness on her left nipple. She denies abnormal vaginal bleeding, discharge, pelvic pain, and urinary symptoms. She reports having some palpitations that started after she had 2 seizures while donating platelets. She believes they are related to anxiety and caffeine intake, and they have improved since decreasing her caffeine. She is following up with neurology. She has no other questions or complaints today.  ? ?Medical/Surgical History ?Past Medical History:  ?Diagnosis Date  ? Anemia   ? Anxiety   ? situational  ? H/O Bell's palsy   ? Medical history non-contributory   ? Seizures (HCC)   ? ?Past Surgical History:  ?Procedure Laterality Date  ? INCISION AND DRAINAGE ABSCESS Left 12/24/2019  ? Procedure: INCISION AND DRAINAGE ABSCESS;  Surgeon: Carolan Shiver, MD;  Location: [facility] ORS;  Service: General;  Laterality: Left;  ? NO PAST SURGERIES    ? ? ?Social History ?Lives with partner and 72-year-old son ?Work: Retirement home ?Exercise: walking at work, lifting patients ?Substances: quit smoking two weeks ago! Denies vape, recreational drugs, and alcohol ? ?Obstetric History ?OB History   ? ? Gravida  ?1  ? Para  ?1  ? Term  ?1  ? Preterm  ?   ? AB  ?   ? Living  ?1  ?  ? ? SAB  ?   ? IAB  ?   ? Ectopic  ?   ? Multiple  ?   ? Live Births  ?1  ?   ?  ?  ?  ? ?GYN/Menstrual History ?Patient's last menstrual period was 12/07/2021 (approximate). ?Regular monthly periods ?Last Pap: Approximately 6 years ago ?Contraception: none ?Prevention ?Encouraged regular dental care and eye exams ?Mammogram: would like breast US ? ? ?Current Medications ?No outpatient medications prior to visit.  ? ?No  facility-administered medications prior to visit.  ?  ? ? [facility] - 01/17/22 1706   ? ?  ? Pregnancy Intention Screening  ? Does the patient want to become pregnant in the next year? Ok Either Way   ? Does the patient's partner want to become pregnant in the next year? Ok Either Way   ? Would the patient like to discuss contraceptive options today? No   ?  ? Contraception Wrap Up  ? Current Method No Contraceptive Precautions   ? End Method No Contraception Precautions   ? Contraception Counseling Provided No   ? ?  ?  ? ?  ? ?The pregnancy intention screening data noted above was reviewed. Potential methods of contraception were discussed. The patient elected to proceed with No Contraception Precautions.  ? ?ROS ?History obtained from the patient ?General ROS: negative for - chills or fatigue ?Psychological ROS: negative for - anxiety or depression ?Ophthalmic ROS: negative for - blurry vision or loss of vision ?ENT ROS: negative for - headaches or sinus pain ?Hematological and Lymphatic ROS: negative for - swollen lymph nodes ?Endocrine ROS: positive for - palpitations ?Breast ROS: negative for breast lumps ?positive for - nipple tenderness without discoloration or discharge ?Respiratory ROS: no cough, shortness of breath, or wheezing ?Cardiovascular ROS: no chest pain or dyspnea on exertion ?Gastrointestinal ROS: no abdominal  pain, change in bowel habits, or black or bloody stools ?Genito-Urinary ROS: no dysuria, trouble voiding, or hematuria ?Musculoskeletal ROS: negative for - joint pain or muscle pain ?Dermatological ROS: negative ? ? ?  11/23/2021  ?  3:59 PM 11/06/2021  ?  3:37 PM 11/02/2021  ?  2:52 PM 10/30/2021  ?  1:29 PM 09/01/2019  ?  1:37 PM  ?Depression screen PHQ 2/9  ?Decreased Interest 1 2 2 2  0  ?Down, Depressed, Hopeless 0 0 0 2 0  ?PHQ - 2 Score 1 2 2 4  0  ?Altered sleeping 1  3 1    ?Tired, decreased energy 1  2 1    ?Change in appetite 0  0 0   ?Feeling bad or failure about yourself  0  1 0    ?Trouble concentrating 0  0 0   ?Moving slowly or fidgety/restless 0  3 0   ?Suicidal thoughts 0  0 0   ?PHQ-9 Score 3  11 6    ?Difficult doing work/chores Not difficult at all      ?  ? ?OBJECTIVE ? ?Last Weight  Most recent update: 01/17/2022  1:13 PM  ? ? Weight  ?69.9 kg (154 lb)  ?      ? ?  ?  ?Body mass index is 27.98 kg/m?.  ? ? ?BP 114/74   Pulse 72   Wt 154 lb (69.9 kg)   LMP 12/07/2021 (Approximate)   BMI 27.98 kg/m?  ?General appearance: alert, cooperative, and appears stated age ?Head: Normocephalic, without obvious abnormality, atraumatic ?Eyes: negative findings: lids and lashes normal and conjunctivae and sclerae normal ?Ears:  cerumen buildup in right ear ?Neck: no adenopathy, supple, symmetrical, trachea midline, and thyroid not enlarged, symmetric, no tenderness/mass/nodules ?Lungs: clear to auscultation bilaterally ?Breasts: normal appearance, no masses or tenderness, Inspection negative, Normal to palpation without dominant masses, scar on left breast from previous cyst drainage ?Heart: regular rate and rhythm, S1, S2 normal, no murmur, click, rub or gallop ?Abdomen: soft, non-tender; bowel sounds normal; no masses,  no organomegaly ?Pelvic: cervix normal in appearance, external genitalia normal, no cervical motion tenderness, rectovaginal septum normal, vagina normal without discharge, and Pap collected. ?Extremities: extremities normal, atraumatic, no cyanosis or edema ?Pulses: 2+ and symmetric ?Skin: Skin color, texture, turgor normal. No rashes or lesions ?Lymph nodes: Cervical, supraclavicular, and axillary nodes normal. ? ?ASSESSMENT  ?1) Annual exam ?2) Pap due ?3) Pt desires surveillance of site of breast cyst ? ?PLAN ?1) Physical exam as noted. Declines STI testing and other labs. Encouraged prenatal vitamin with folic acid. Reviewed healthy lifestyle choices. ?2) Pap collected. F/u based on results. ?3) Ordered breast ? ? ?Return in one year for annual exam or as needed for  concerns. ? ? ? , CNM   ?

## 2022-01-19 ENCOUNTER — Ambulatory Visit (INDEPENDENT_AMBULATORY_CARE_PROVIDER_SITE_OTHER): Payer: Medicaid Other | Admitting: Internal Medicine

## 2022-01-19 ENCOUNTER — Encounter: Payer: Self-pay | Admitting: Obstetrics

## 2022-01-19 ENCOUNTER — Encounter: Payer: Self-pay | Admitting: Internal Medicine

## 2022-01-19 VITALS — BP 111/75 | HR 87 | Temp 98.3°F | Ht 62.21 in | Wt 156.0 lb

## 2022-01-19 DIAGNOSIS — J069 Acute upper respiratory infection, unspecified: Secondary | ICD-10-CM | POA: Insufficient documentation

## 2022-01-19 DIAGNOSIS — J0110 Acute frontal sinusitis, unspecified: Secondary | ICD-10-CM | POA: Insufficient documentation

## 2022-01-19 LAB — CYTOLOGY - PAP: Diagnosis: NEGATIVE

## 2022-01-19 LAB — RAPID STREP SCREEN (MED CTR MEBANE ONLY): Strep Gp A Ag, IA W/Reflex: POSITIVE — AB

## 2022-01-19 LAB — VERITOR FLU A/B WAIVED
Influenza A: NEGATIVE
Influenza B: NEGATIVE

## 2022-01-19 MED ORDER — AMOXICILLIN-POT CLAVULANATE 875-125 MG PO TABS: 1.0000 | ORAL_TABLET | Freq: Two times a day (BID) | ORAL | 0 refills | Status: DC

## 2022-01-19 MED ORDER — FEXOFENADINE HCL 180 MG PO TABS: 180.0000 mg | ORAL_TABLET | Freq: Every day | ORAL | 1 refills | Status: DC

## 2022-01-19 NOTE — Progress Notes (Signed)
? ?BP 111/75   Pulse 87   Temp 98.3 ?F (36.8 ?C) (Oral)   Ht 5' 2.21" (1.58 m)   Wt 156 lb (70.8 kg)   SpO2 99%   BMI 28.35 kg/m?   ? ?Subjective:  ? ? Patient ID: Kimberly Mosley, female    DOB: 12/28/1995, 28 y.o.   MRN: [id number] ? ?Chief Complaint  ?Patient presents with  ? Nasal Congestion  ?  SOB, cold chills, body aches  ? ? ?HPI: ?Kimberly Mosley is a 26 y.o. female ? ?Patient presents with: ?Nasal Congestion: SOB, cold chills, body aches ? ? ? ?Fever  ?This is a new problem. The current episode started yesterday. Pertinent negatives include no abdominal pain, chest pain, congestion, coughing, diarrhea, ear pain, headaches, muscle aches, nausea, rash, sleepiness, sore throat, urinary pain, vomiting or wheezing.  ?Sinus Problem ?This is a new problem. The maximum temperature recorded prior to her arrival was 100.4 - 100.9 F. Associated symptoms include shortness of breath and sinus pressure. Pertinent negatives include no chills, congestion, coughing, diaphoresis, ear pain, headaches, hoarse voice, neck pain, sneezing, sore throat or swollen glands.  ? ?Chief Complaint  ?Patient presents with  ? Nasal Congestion  ?  SOB, cold chills, body aches  ? ? ?Relevant past medical, surgical, family and social history reviewed and updated as indicated. Interim medical history since our last visit reviewed. ?Allergies and medications reviewed and updated. ? ?Review of Systems  ?Constitutional:  Positive for fever. Negative for chills and diaphoresis.  ?HENT:  Positive for sinus pressure. Negative for congestion, ear pain, hoarse voice, sneezing and sore throat.   ?Respiratory:  Positive for shortness of breath. Negative for cough and wheezing.   ?Cardiovascular:  Negative for chest pain.  ?Gastrointestinal:  Negative for abdominal pain, diarrhea, nausea and vomiting.  ?Genitourinary:  Negative for dysuria.  ?Musculoskeletal:  Negative for neck pain.  ?Skin:  Negative for rash.  ?Neurological:  Negative for  headaches.  ? ?Per HPI unless specifically indicated above ? ?   ?Objective:  ?  ?BP 111/75   Pulse 87   Temp 98.3 ?F (36.8 ?C) (Oral)   Ht 5' 2.21" (1.58 m)   Wt 156 lb (70.8 kg)   SpO2 99%   BMI 28.35 kg/m?   ?Wt Readings from Last 3 Encounters:  ?01/19/22 156 lb (70.8 kg)  ?01/17/22 154 lb (69.9 kg)  ?11/23/21 153 lb (69.4 kg)  ?  ?Physical Exam ? ?Results for orders placed or performed in visit on 10/30/21  ?CBC with Differential/Platelet  ?Result Value Ref Range  ? WBC 7.3 3.4 - 10.8 x10E3/uL  ? RBC 4.47 3.77 - 5.28 x10E6/uL  ? Hemoglobin 13.3 11.1 - 15.9 g/dL  ? Hematocrit 40.1 34.0 - 46.6 %  ? MCV 90 79 - 97 fL  ? MCH 29.8 26.6 - 33.0 pg  ? MCHC 33.2 31.5 - 35.7 g/dL  ? RDW 11.7 11.7 - 15.4 %  ? Platelets 324 150 - 450 x10E3/uL  ? Neutrophils 48 Not Estab. %  ? Lymphs 43 Not Estab. %  ? Monocytes 6 Not Estab. %  ? Eos 2 Not Estab. %  ? Basos 1 Not Estab. %  ? Neutrophils Absolute 3.5 1.4 - 7.0 x10E3/uL  ? Lymphocytes Absolute 3.2 (H) 0.7 - 3.1 x10E3/uL  ? Monocytes Absolute 0.4 0.1 - 0.9 x10E3/uL  ? EOS (ABSOLUTE) 0.1 0.0 - 0.4 x10E3/uL  ? Basophils Absolute 0.1 0.0 - 0.2 x10E3/uL  ? Immature Granulocytes 0  Not Estab. %  ? Immature Grans (Abs) 0.0 0.0 - 0.1 x10E3/uL  ?Comprehensive metabolic panel  ?Result Value Ref Range  ? Glucose 81 70 - 99 mg/dL  ? BUN 5 (L) 6 - 20 mg/dL  ? Creatinine, Ser 0.63 0.57 - 1.00 mg/dL  ? eGFR 125 >59 mL/min/1.73  ? BUN/Creatinine Ratio 8 (L) 9 - 23  ? Sodium 141 134 - 144 mmol/L  ? Potassium 4.3 3.5 - 5.2 mmol/L  ? Chloride 104 96 - 106 mmol/L  ? CO2 23 20 - 29 mmol/L  ? Calcium 9.4 8.7 - 10.2 mg/dL  ? Total Protein 6.8 6.0 - 8.5 g/dL  ? Albumin 4.7 3.9 - 5.0 g/dL  ? Globulin, Total 2.1 1.5 - 4.5 g/dL  ? Albumin/Globulin Ratio 2.2 1.2 - 2.2  ? Bilirubin Total <0.2 0.0 - 1.2 mg/dL  ? Alkaline Phosphatase 68 44 - 121 IU/L  ? AST 23 0 - 40 IU/L  ? ALT 17 0 - 32 IU/L  ?Urinalysis, Routine w reflex microscopic  ?Result Value Ref Range  ? Specific Gravity, UA 1.020 1.005 -  1.030  ? pH, UA 7.0 5.0 - 7.5  ? Color, UA Yellow Yellow  ? Appearance Ur Clear Clear  ? Leukocytes,UA Negative Negative  ? Protein,UA Negative Negative/Trace  ? Glucose, UA Negative Negative  ? Ketones, UA Negative Negative  ? RBC, UA Negative Negative  ? Bilirubin, UA Negative Negative  ? Urobilinogen, Ur 0.2 0.2 - 1.0 mg/dL  ? Nitrite, UA Negative Negative  ?TSH  ?Result Value Ref Range  ? TSH 1.120 0.450 - 4.500 uIU/mL  ?Bayer DCA Hb A1c Waived (STAT)  ?Result Value Ref Range  ? HB A1C (BAYER DCA - WAIVED) 5.1 4.8 - 5.6 %  ? ?   ? ? ?Current Outpatient Medications:  ?  amoxicillin-clavulanate (AUGMENTIN) 875-125 MG tablet, Take 1 tablet by mouth 2 (two) times daily., Disp: 20 tablet, Rfl: 0 ?  fexofenadine (ALLEGRA ALLERGY) 180 MG tablet, Take 1 tablet (180 mg total) by mouth daily., Disp: 10 tablet, Rfl: 1  ? ? ?Assessment & Plan:  ?URI / acute sinusitis / strep throat ?Pt is Strep +Ve  ?Will start pt on augmentin for such  ?Check for COVID  Flu and strep ordered at this visit, both negative. pt advised to take Tylenol q 4- 6 hourly as needed. pt to take allegra q pm as needed and to call office if symptoms worsened pt verbalised understanding of such. ? ?  ? ?Problem List Items Addressed This Visit   ? ?  ? Respiratory  ? Upper respiratory tract infection - Primary  ? Relevant Orders  ? Rapid Strep Screen ([facility] ONLY)  ? Novel Coronavirus, NAA ([facility])  ? Veritor Flu A/B Waived  ? Acute non-recurrent frontal sinusitis  ? Relevant Medications  ? fexofenadine (ALLEGRA ALLERGY) 180 MG tablet  ? amoxicillin-clavulanate (AUGMENTIN) 875-125 MG tablet  ?  ? ?Orders Placed This Encounter  ?Procedures  ? Rapid Strep Screen ([facility] ONLY)  ? Novel Coronavirus, NAA ([facility])  ? Veritor Flu A/B Waived  ?  ? ?Meds ordered this encounter  ?Medications  ? fexofenadine (ALLEGRA ALLERGY) 180 MG tablet  ?  Sig: Take 1 tablet (180 mg total) by mouth daily.  ?  Dispense:  10 tablet  ?  Refill:  1  ?  amoxicillin-clavulanate (AUGMENTIN) 875-125 MG tablet  ?  Sig: Take 1 tablet by mouth 2 (two) times daily.  ?  Dispense:  20 tablet  ?  Refill:  0  ?  ? ?Follow up plan: ?No follow-ups on file. ? ? ?

## 2022-01-20 LAB — NOVEL CORONAVIRUS, NAA: SARS-CoV-2, NAA: NOT DETECTED

## 2022-04-09 ENCOUNTER — Encounter: Payer: Self-pay | Admitting: Obstetrics

## 2022-07-20 ENCOUNTER — Encounter: Payer: Self-pay | Admitting: Nurse Practitioner

## 2022-07-20 ENCOUNTER — Ambulatory Visit (INDEPENDENT_AMBULATORY_CARE_PROVIDER_SITE_OTHER): Payer: Medicaid Other | Admitting: Nurse Practitioner

## 2022-07-20 VITALS — BP 110/67 | HR 68 | Temp 98.4°F | Wt 159.6 lb

## 2022-07-20 DIAGNOSIS — R6889 Other general symptoms and signs: Secondary | ICD-10-CM | POA: Diagnosis not present

## 2022-07-20 DIAGNOSIS — J029 Acute pharyngitis, unspecified: Secondary | ICD-10-CM

## 2022-07-20 NOTE — Progress Notes (Signed)
BP 110/67   Pulse 68   Temp 98.4 F (36.9 C) (Oral)   Wt 159 lb 9.6 oz (72.4 kg)   LMP 07/18/2022 (Exact Date)   SpO2 98%   BMI 29.00 kg/m    Subjective:    Patient ID: Kimberly Mosley, female    DOB: [birth year], 26 y.o.   MRN: [id number]  HPI: Kimberly Mosley is a 25 y.o. 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 Sxs began yesterday.    UPPER RESPIRATORY TRACT INFECTION Worst symptom: Symptoms started yesterday Fever:  unsure Cough: yes Shortness of breath: no Wheezing: no Chest pain: no Chest tightness: no Chest congestion: yes Nasal congestion: yes Runny nose: yes Post nasal drip: yes Sneezing: yes Sore throat: yes Swollen glands: yes Sinus pressure: yes Headache: no Face pain: no Toothache: no Ear pain: no bilateral Ear pressure: right ear Eyes red/itching:no Eye drainage/crusting: no  Vomiting: no Rash: no Fatigue: yes Sick contacts: yes Strep contacts: no  Context: worse Recurrent sinusitis: no Relief with OTC cold/cough medications: no Treatments attempted:  nyquil    Relevant past medical, surgical, family and social history reviewed and updated as indicated. Interim medical history since our last visit reviewed. Allergies and medications reviewed and updated.  Review of Systems  Constitutional:  Positive for fatigue. Negative for fever.  HENT:  Positive for congestion, postnasal drip, rhinorrhea, sinus pressure, sneezing and sore throat. Negative for dental problem, ear pain and sinus pain.   Respiratory:  Positive for cough. Negative for shortness of breath and wheezing.   Cardiovascular:  Negative for chest pain.  Gastrointestinal:  Negative for vomiting.  Skin:  Negative for rash.  Neurological:  Negative for headaches.    Per HPI unless specifically indicated above     Objective:    BP 110/67   Pulse 68    Temp 98.4 F (36.9 C) (Oral)   Wt 159 lb 9.6 oz (72.4 kg)   LMP 07/18/2022 (Exact Date)   SpO2 98%   BMI 29.00 kg/m   Wt Readings from Last 3 Encounters:  07/20/22 159 lb 9.6 oz (72.4 kg)  01/19/22 156 lb (70.8 kg)  01/17/22 154 lb (69.9 kg)    Physical Exam Vitals and nursing note reviewed.  Constitutional:      General: She is not in acute distress.    Appearance: Normal appearance. She is normal weight. She is not ill-appearing, toxic-appearing or diaphoretic.  HENT:     Head: Normocephalic.     Right Ear: External ear normal. A middle ear effusion is present.     Left Ear: External ear normal. A middle ear effusion is present.     Nose: Nose normal.     Mouth/Throat:     Mouth: Mucous membranes are moist. No oral lesions.     Pharynx: Oropharynx is clear. Posterior oropharyngeal erythema present. No oropharyngeal exudate.  Eyes:     General:        Right eye: No discharge.        Left eye: No discharge.     Extraocular Movements: Extraocular movements intact.     Conjunctiva/sclera: Conjunctivae normal.     Pupils: Pupils are equal, round, and reactive to light.  Cardiovascular:     Rate and Rhythm: Normal rate and regular rhythm.     Heart sounds: No  murmur heard. Pulmonary:     Effort: Pulmonary effort is normal. No respiratory distress.     Breath sounds: Normal breath sounds. No wheezing or rales.  Musculoskeletal:     Cervical back: Normal range of motion and neck supple.  Skin:    General: Skin is warm and dry.     Capillary Refill: Capillary refill takes less than 2 seconds.  Neurological:     General: No focal deficit present.     Mental Status: She is alert and oriented to person, place, and time. Mental status is at baseline.  Psychiatric:        Mood and Affect: Mood normal.        Behavior: Behavior normal.        Thought Content: Thought content normal.        Judgment: Judgment normal.     Results for orders placed or performed in visit on  01/19/22  Rapid Strep Screen ([facility] ONLY)   Specimen: Other   Other  Result Value Ref Range   Strep Gp A Ag, IA W/Reflex Positive (A) Negative  Novel Coronavirus, NAA ([facility])   Specimen: Nasopharyngeal(NP) swabs in vial transport medium  Result Value Ref Range   SARS-CoV-2, NAA Not Detected Not Detected  Veritor Flu A/B Waived  Result Value Ref Range   Influenza A Negative Negative   Influenza B Negative Negative      Assessment & Plan:   Problem List Items Addressed This Visit   None Visit Diagnoses     Flu-like symptoms    -  Primary   Flu and Strep negative. Will await COVID results. Recommend OTC symptom manamgent. Stay well hydrated. FU if not improved.   Relevant Orders   Novel Coronavirus, NAA ([facility])   Influenza A & B (STAT)   Sore throat       Relevant Orders   Rapid Strep Screen ([facility] ONLY)        Follow up plan: Return in about 1 month (around 08/19/2022) for Depression/Anxiety FU.

## 2022-07-20 NOTE — Progress Notes (Signed)
Results discussed during visit

## 2022-07-23 LAB — VERITOR FLU A/B WAIVED
Influenza A: NEGATIVE
Influenza B: NEGATIVE

## 2022-07-23 LAB — RAPID STREP SCREEN (MED CTR MEBANE ONLY): Strep Gp A Ag, IA W/Reflex: NEGATIVE

## 2022-07-23 LAB — CULTURE, GROUP A STREP: Strep A Culture: NEGATIVE

## 2022-07-24 LAB — NOVEL CORONAVIRUS, NAA

## 2022-07-24 NOTE — Progress Notes (Signed)
HI Kimberly Mosley.  Unfortunately the specimen for your COVID test leaked during your transit.  If you want [place] to repeat it we can definitely do that.

## 2022-08-20 ENCOUNTER — Ambulatory Visit: Payer: Medicaid Other | Admitting: Nurse Practitioner

## 2022-08-20 NOTE — Progress Notes (Deleted)
   There were no vitals taken for this visit.   Subjective:    Patient ID: Kimberly Mosley, female    DOB: [birth year], 26 y.o.   MRN: [id number]  HPI: Kimberly Mosley is a 26 y.o. female  No chief complaint on file.  DEPRESSION/ANXIETY   Relevant past medical, surgical, family and social history reviewed and updated as indicated. Interim medical history since our last visit reviewed. Allergies and medications reviewed and updated.  Review of Systems  Per HPI unless specifically indicated above     Objective:    There were no vitals taken for this visit.  Wt Readings from Last 3 Encounters:  07/20/22 159 lb 9.6 oz (72.4 kg)  01/19/22 156 lb (70.8 kg)  01/17/22 154 lb (69.9 kg)    Physical Exam  Results for orders placed or performed in visit on 07/20/22  Novel Coronavirus, NAA ([facility])   Specimen: Nasopharyngeal(NP) swabs in vial transport medium  Result Value Ref Range   SARS-CoV-2, NAA CANCELED   Rapid Strep Screen ([facility] ONLY)   Specimen: Other   Other  Result Value Ref Range   Strep Gp A Ag, IA W/Reflex Negative Negative  Culture, Group A Strep   Other  Result Value Ref Range   Strep A Culture Negative   Influenza A & B (STAT)  Result Value Ref Range   Influenza A Negative Negative   Influenza B Negative Negative      Assessment & Plan:   Problem List Items Addressed This Visit   None    Follow up plan: No follow-ups on file.

## 2022-08-31 ENCOUNTER — Encounter: Payer: Self-pay | Admitting: Physician Assistant

## 2022-08-31 ENCOUNTER — Ambulatory Visit (INDEPENDENT_AMBULATORY_CARE_PROVIDER_SITE_OTHER): Payer: Medicaid Other | Admitting: Physician Assistant

## 2022-08-31 ENCOUNTER — Ambulatory Visit: Payer: Self-pay | Admitting: *Deleted

## 2022-08-31 VITALS — BP 117/71 | HR 80 | Temp 98.0°F | Wt 165.5 lb

## 2022-08-31 DIAGNOSIS — R051 Acute cough: Secondary | ICD-10-CM

## 2022-08-31 MED ORDER — PREDNISONE 20 MG PO TABS: ORAL_TABLET | ORAL | 0 refills | Status: DC

## 2022-08-31 NOTE — Telephone Encounter (Signed)
  Chief Complaint: cough Symptoms: cough, chest congestion,wheezing  Frequency: several weeks Pertinent Negatives: Patient denies   Disposition: [] ED /[] Urgent Care (no appt availability in office) / [x] Appointment(In office/virtual)/ []  [facility]/ [] Home Care/ [] Refused Recommended Disposition /[] [facility]/ []  Follow-up with PCP Additional Notes: Patient was able to get off work to come to appointment today

## 2022-08-31 NOTE — Telephone Encounter (Signed)
Reason for Disposition  [1] Continuous (nonstop) coughing interferes with work or school AND [2] no improvement using cough treatment per Care Advice  Answer Assessment - Initial Assessment Questions 1. ONSET: "When did the cough begin?"      Several weeks- was seen 9/29 2. SEVERITY: "How bad is the cough today?"      Wet cough- mucus not coming up 3. SPUTUM: "Describe the color of your sputum" (none, dry cough; clear, white, yellow, green)     Not getting sputum up 4. HEMOPTYSIS: "Are you coughing up any blood?" If so ask: "How much?" (flecks, streaks, tablespoons, etc.)     no 5. DIFFICULTY BREATHING: "Are you having difficulty breathing?" If Yes, ask: "How bad is it?" (e.g., mild, moderate, severe)    - MILD: No SOB at rest, mild SOB with walking, speaks normally in sentences, can lie down, no retractions, pulse < 100.    - MODERATE: SOB at rest, SOB with minimal exertion and prefers to sit, cannot lie down flat, speaks in phrases, mild retractions, audible wheezing, pulse 100-120.    - SEVERE: Very SOB at rest, speaks in single words, struggling to breathe, sitting hunched forward, retractions, pulse > 120      More trouble at night 6. FEVER: "Do you have a fever?" If Yes, ask: "What is your temperature, how was it measured, and when did it start?"     No- hot flashes 7. CARDIAC HISTORY: "Do you have any history of heart disease?" (e.g., heart attack, congestive heart failure)      no 8. LUNG HISTORY: "Do you have any history of lung disease?"  (e.g., pulmonary embolus, asthma, emphysema)     no 9. PE RISK FACTORS: "Do you have a history of blood clots?" (or: recent major surgery, recent prolonged travel, bedridden)     no 10. OTHER SYMPTOMS: "Do you have any other symptoms?" (e.g., runny nose, wheezing, chest pain)       Wheezing, runny nose  Protocols used: Cough - Acute Productive-A-AH

## 2022-08-31 NOTE — Patient Instructions (Signed)
  I am sending in a script for Prednisone  Taking this in the AM as it can cause sleeplessness. It can cause increased appetite, irritability and elevated blood sugars so please be mindful of this  If you are not feeling better in 5-7 days let us know and we will send you for a chest xray to make sure you do not have something going on in your lungs Please continue to take the Mucinex but do so with plenty of water throughout the day to help move the mucus along

## 2022-08-31 NOTE — Progress Notes (Unsigned)
          Acute Office Visit   Patient: Kimberly Mosley   DOB: [birth year]   26 y.o. Female  MRN: [id number] Visit Date: 08/31/2022  Today's healthcare provider: Oswaldo Conroy Sheyanne Munley, PA-C  Introduced myself to the patient as a Secondary school teacher and provided education on APPs in clinical practice.    Chief Complaint  Patient presents with   Cough    Patient states she has been coughing since September, has now moved into her chest and has congestion. Patient states she has tried nyquil, dayquil, mucinex, and mucinex nasal spray.    Subjective    Cough Associated symptoms include rhinorrhea and wheezing. Pertinent negatives include no chills, fever, headaches, myalgias, sore throat or shortness of breath.   HPI     Cough    Additional comments: Patient states she has been coughing since September, has now moved into her chest and has congestion. Patient states she has tried nyquil, dayquil, mucinex, and mucinex nasal spray.       Last edited by Rolley Sims, CMA on 08/31/2022  3:00 PM.       Cough  Feels like it should be productive cough but is unable to fully expectorate Feels like coughing and wheezing is worse at night Interventions: She has tried Mucinex, Dayquil, Nyquil, - nothing has provided relief  Alleviating: nothing  Aggravating: nothing   She denies previous history of asthma dx but states it runs in her family     Medications: No outpatient medications prior to visit.   No facility-administered medications prior to visit.    Review of Systems  Constitutional:  Negative for chills and fever.  HENT:  Positive for rhinorrhea. Negative for congestion, sore throat and trouble swallowing.   Respiratory:  Positive for cough, chest tightness and wheezing. Negative for shortness of breath.   Gastrointestinal:  Negative for diarrhea, nausea and vomiting.  Musculoskeletal:  Negative for myalgias.  Neurological:  Negative for dizziness and headaches.    {Labs  Heme  Chem   Endocrine  Serology  Results Review (optional):23779}   Objective    BP 117/71   Pulse 80   Temp 98 F (36.7 C)   Wt 165 lb 8 oz (75.1 kg)   SpO2 98%   BMI 30.07 kg/m  {Show previous vital signs (optional):23777}  Physical Exam Vitals reviewed.  Constitutional:      General: She is awake.     Appearance: Normal appearance. She is well-developed and well-groomed.  HENT:     Head: Normocephalic and atraumatic.  Cardiovascular:     Rate and Rhythm: Normal rate and regular rhythm.     Pulses: Normal pulses.     Heart sounds: Normal heart sounds. No murmur heard.    No friction rub. No gallop.  Pulmonary:     Effort: Pulmonary effort is normal.     Breath sounds: Normal breath sounds. No decreased air movement. No decreased breath sounds, wheezing, rhonchi or rales.  Neurological:     Mental Status: She is alert.  Psychiatric:        Behavior: Behavior is cooperative.       No results found for any visits on 08/31/22.  Assessment & Plan      No follow-ups on file.

## 2022-09-03 ENCOUNTER — Ambulatory Visit: Payer: Medicaid Other | Admitting: Family Medicine

## 2022-11-26 ENCOUNTER — Encounter: Payer: Medicaid Other | Admitting: Nurse Practitioner

## 2022-12-05 ENCOUNTER — Encounter: Payer: Medicaid Other | Admitting: Nurse Practitioner

## 2022-12-05 NOTE — Progress Notes (Deleted)
There were no vitals taken for this visit.   Subjective:    Patient ID: Kimberly Mosley, female    DOB: 1996-02-24, 27 y.o.   MRN: [id number]  HPI: Kimberly Mosley is a 27 y.o. female presenting on 12/05/2022 for comprehensive medical examination. Current medical complaints include:{Blank single:19197::"none","***"}  She currently lives with: Menopausal Symptoms: {Blank single:19197::"yes","no"}  MOOD   Depression Screen done today and results listed below:     07/20/2022   10:07 AM 11/23/2021    3:59 PM 11/06/2021    3:37 PM 11/02/2021    2:52 PM 10/30/2021    1:29 PM  Depression screen PHQ 2/9  Decreased Interest 0 1 2 2 2  $ Down, Depressed, Hopeless 0 0 0 0 2  PHQ - 2 Score 0 1 2 2 4  $ Altered sleeping 0 1  3 1  $ Tired, decreased energy 0 1  2 1  $ Change in appetite 0 0  0 0  Feeling bad or failure about yourself  0 0  1 0  Trouble concentrating 0 0  0 0  Moving slowly or fidgety/restless 0 0  3 0  Suicidal thoughts 0 0  0 0  PHQ-9 Score 0 3  11 6  $ Difficult doing work/chores Not difficult at all Not difficult at all       The patient {has/does not have:19849} a history of falls. I {did/did not:19850} complete a risk assessment for falls. A plan of care for falls {was/was not:19852} documented.   Past Medical History:  Past Medical History:  Diagnosis Date   Anemia    Anxiety    situational   H/O Bell's palsy    Medical history non-contributory    Seizures ([place])     Surgical History:  Past Surgical History:  Procedure Laterality Date   INCISION AND DRAINAGE ABSCESS Left 12/24/2019   Procedure: INCISION AND DRAINAGE ABSCESS;  Surgeon: Herbert Pun, MD;  Location: [facility] ORS;  Service: General;  Laterality: Left;   NO PAST SURGERIES      Medications:  Current Outpatient Medications on File Prior to Visit  Medication Sig   predniSONE (DELTASONE) 20 MG tablet Take 15m PO daily x 2 days, then474mPO daily x 2 days, then 2060mO daily x 3 days   [DISCONTINUED]  FLUoxetine (PROZAC) 20 MG capsule Take 1 capsule (20 mg total) by mouth daily. (Patient not taking: Reported on 06/05/2018)   No current facility-administered medications on file prior to visit.    Allergies:  No Known Allergies  Social History:  Social History   Socioeconomic History   Marital status: Single    Spouse name: Not on file   Number of children: 1   Years of education: Not on file   Highest education level: Not on file  Occupational History   Not on file  Tobacco Use   Smoking status: Former    Packs/day: 0.50    Years: 7.00    Total pack years: 3.50    Types: Cigarettes    Quit date: 01/03/2022    Years since quitting: 0.9   Smokeless tobacco: Never  Vaping Use   Vaping Use: Never used  Substance and Sexual Activity   Alcohol use: Not Currently    Alcohol/week: 2.0 standard drinks of alcohol    Types: 2 Cans of beer per week    Comment: Last ETOH use 2 days ago.   Drug use: No   Sexual activity: Yes    Birth control/protection: None,  Condom    Comment: Nexplanon removed 12/2018 with no birth control used since removal.  Other Topics Concern   Not on file  Social History Narrative   Not on file   Social Determinants of Health   Financial Resource Strain: Not on file  Food Insecurity: No Food Insecurity (11/06/2021)   Hunger Vital Sign    Worried About Running Out of Food in the Last Year: Never true    Ran Out of Food in the Last Year: Never true  Transportation Needs: No Transportation Needs (11/06/2021)   PRAPARE - Hydrologist (Medical): No    Lack of Transportation (Non-Medical): No  Physical Activity: Inactive (11/06/2021)   Exercise Vital Sign    Days of Exercise per Week: 0 days    Minutes of Exercise per Session: 0 min  Stress: No Stress Concern Present (11/06/2021)   Tool    Feeling of Stress : Only a little  Social Connections: Socially  Isolated (11/06/2021)   Social Connection and Isolation Panel [NHANES]    Frequency of Communication with Friends and Family: More than three times a week    Frequency of Social Gatherings with Friends and Family: More than three times a week    Attends Religious Services: Never    Marine scientist or Organizations: No    Attends Archivist Meetings: Never    Marital Status: Never married  Intimate Partner Violence: Not At Risk (09/01/2019)   Humiliation, Afraid, Rape, and Kick questionnaire    Fear of Current or Ex-Partner: No    Emotionally Abused: No    Physically Abused: No    Sexually Abused: No   Social History   Tobacco Use  Smoking Status Former   Packs/day: 0.50   Years: 7.00   Total pack years: 3.50   Types: Cigarettes   Quit date: 01/03/2022   Years since quitting: 0.9  Smokeless Tobacco Never   Social History   Substance and Sexual Activity  Alcohol Use Not Currently   Alcohol/week: 2.0 standard drinks of alcohol   Types: 2 Cans of beer per week   Comment: Last ETOH use 2 days ago.    Family History:  Family History  Problem Relation Age of Onset   Depression Father    Bipolar disorder Father    Healthy Mother    Healthy Sister    Healthy Brother    Cancer Maternal Uncle        pancreatic cancer   Heart disease Maternal Grandmother    Stroke Maternal Grandmother    Healthy Maternal Grandfather    Breast cancer Neg Hx    Colon cancer Neg Hx    Ovarian cancer Neg Hx     Past medical history, surgical history, medications, allergies, family history and social history reviewed with patient today and changes made to appropriate areas of the chart.   ROS All other ROS negative except what is listed above and in the HPI.      Objective:    There were no vitals taken for this visit.  Wt Readings from Last 3 Encounters:  08/31/22 165 lb 8 oz (75.1 kg)  07/20/22 159 lb 9.6 oz (72.4 kg)  01/19/22 156 lb (70.8 kg)    Physical  Exam  Results for orders placed or performed in visit on 07/20/22  Novel Coronavirus, NAA ([facility])   Specimen: Nasopharyngeal(NP) swabs in vial transport medium  Result  Value Ref Range   SARS-CoV-2, NAA CANCELED   Rapid Strep Screen ([facility] ONLY)   Specimen: Other   Other  Result Value Ref Range   Strep Gp A Ag, IA W/Reflex Negative Negative  Culture, Group A Strep   Other  Result Value Ref Range   Strep A Culture Negative   Influenza A & B (STAT)  Result Value Ref Range   Influenza A Negative Negative   Influenza B Negative Negative      Assessment & Plan:   Problem List Items Addressed This Visit       Other   Anxiety and depression   Class 1 obesity due to excess calories without serious comorbidity with body mass index (BMI) of 32.0 to 32.9 in adult - Primary     Follow up plan: No follow-ups on file.   LABORATORY TESTING:  - Pap smear: {Blank [id number] done","not applicable","up to date","done elsewhere"}  IMMUNIZATIONS:   - Tdap: Tetanus vaccination status reviewed: {tetanus status:315746}. - Influenza: {Blank single:19197::"Up to date","Administered today","Postponed to flu season","Refused","Given elsewhere"} - Pneumovax: {Blank single:19197::"Up to date","Administered today","Not applicable","Refused","Given elsewhere"} - Prevnar: {Blank single:19197::"Up to date","Administered today","Not applicable","Refused","Given elsewhere"} - COVID: {Blank single:19197::"Up to date","Administered today","Not applicable","Refused","Given elsewhere"} - HPV: {Blank single:19197::"Up to date","Administered today","Not applicable","Refused","Given elsewhere"} - Shingrix vaccine: {Blank single:19197::"Up to date","Administered today","Not applicable","Refused","Given elsewhere"}  SCREENING: -Mammogram: {Blank single:19197::"Up to date","Ordered today","Not applicable","Refused","Done elsewhere"}  - Colonoscopy: {Blank single:19197::"Up to date","Ordered  today","Not applicable","Refused","Done elsewhere"}  - Bone Density: {Blank single:19197::"Up to date","Ordered today","Not applicable","Refused","Done elsewhere"}  -Hearing Test: {Blank single:19197::"Up to date","Ordered today","Not applicable","Refused","Done elsewhere"}  -Spirometry: {Blank single:19197::"Up to date","Ordered today","Not applicable","Refused","Done elsewhere"}   PATIENT COUNSELING:   Advised to take 1 mg of folate supplement per day if capable of pregnancy.   Sexuality: Discussed sexually transmitted diseases, partner selection, use of condoms, avoidance of unintended pregnancy  and contraceptive alternatives.   Advised to avoid cigarette smoking.  I discussed with the patient that most people either abstain from alcohol or drink within safe limits (<=14/week and <=4 drinks/occasion for males, <=7/weeks and <= 3 drinks/occasion for females) and that the risk for alcohol disorders and other health effects rises proportionally with the number of drinks per week and how often a drinker exceeds daily limits.  Discussed cessation/primary prevention of drug use and availability of treatment for abuse.   Diet: Encouraged to adjust caloric intake to maintain  or achieve ideal body weight, to reduce intake of dietary saturated fat and total fat, to limit sodium intake by avoiding high sodium foods and not adding table salt, and to maintain adequate dietary potassium and calcium preferably from fresh fruits, vegetables, and low-fat dairy products.    stressed the importance of regular exercise  Injury prevention: Discussed safety belts, safety helmets, smoke detector, smoking near bedding or upholstery.   Dental health: Discussed importance of regular tooth brushing, flossing, and dental visits.    NEXT PREVENTATIVE PHYSICAL DUE IN 1 YEAR. No follow-ups on file.

## 2023-02-10 ENCOUNTER — Other Ambulatory Visit: Payer: Self-pay

## 2023-02-10 ENCOUNTER — Emergency Department
Admission: EM | Admit: 2023-02-10 | Discharge: 2023-02-10 | Disposition: A | Discharge status: Home / Self Care | Payer: Medicaid Other | Attending: Physician Assistant | Admitting: Physician Assistant

## 2023-02-10 DIAGNOSIS — H60391 Other infective otitis externa, right ear: Secondary | ICD-10-CM

## 2023-02-10 DIAGNOSIS — H9201 Otalgia, right ear: Secondary | ICD-10-CM | POA: Diagnosis present

## 2023-02-10 MED ORDER — CIPROFLOXACIN-DEXAMETHASONE 0.3-0.1 % OT SUSP: 4.0000 [drp] | Freq: Two times a day (BID) | OTIC | 0 refills | Status: AC

## 2023-02-10 NOTE — ED Provider Notes (Signed)
[facility] Emergency Department Provider Note     None    (approximate)   History   Otalgia   HPI  Kimberly Mosley is a 27 y.o. female with a history of anxiety, depression, and anemia presents to the the ED for evaluation of right ear fullness nad drainage. She would report intermittent symptoms over the last "few years".  She reports tympanostomy tubes as a child.  She denies fevers chills, sweats, hearing loss, vertigo, or tinnitus.    Physical Exam   Triage Vital Signs: ED Triage Vitals [02/10/23 1049]  Enc Vitals Group     BP      Pulse      Resp      Temp      Temp src      SpO2      Weight 167 lb 8.8 oz (76 kg)     Height  (1.575 m)     Head Circumference      Peak Flow      Pain Score 0     Pain Loc      Pain Edu?      Excl. in GC?     Most recent vital signs: There were no vitals filed for this visit.  General Awake, no distress. NAD HEENT NCAT. PERRL. EOMI. No rhinorrhea. Mucous membranes are moist.  Right TM with maceration, and cloudy debris noted in the canal.  TM appears intact distally. CV:  Good peripheral perfusion.  RESP:  Normal effort.  ABD:  No distention.     ED Results / Procedures / Treatments   Labs (all labs ordered are listed, but only abnormal results are displayed) Labs Reviewed - No data to display   EKG   RADIOLOGY  No results found.   PROCEDURES:  Critical Care performed: No  Procedures Ear wash procedure with warm water Ear curette and cotton swab used to remove debris from canal  MEDICATIONS ORDERED IN ED: Medications - No data to display   IMPRESSION / MDM / ASSESSMENT AND PLAN / ED COURSE  I reviewed the triage vital signs and the nursing notes.                              Differential diagnosis includes, but is not limited to, AOM, otitis externa, TM rupture, mastoiditis, sinusitis  Patient's presentation is most consistent with acute, uncomplicated  illness.  Patient to the ED for evaluation of otorrhea on the right ear.  Patient with a history of chronic intermittent ear drainage, presents for evaluation.  She denies any fevers, hearing loss, tinnitus, vertigo.  Exam reveals an acute otitis externa without canal edema.  The ear canal is cleared of debris and macerated tissue using cotton swab and ear wash procedure.  Patient tolerates that without difficulty.  She is found to have an intact TM on exam.  Patient's diagnosis is consistent with otitis externa. Patient will be discharged home with prescriptions for ciprofloxacin otic. Patient is to follow up with [facility] as needed or otherwise directed.  Patient is given ear drop instructions and instructions on preventing water intrusion into the ear.  Patient is given ED precautions to return to the ED for any worsening or new symptoms.   FINAL CLINICAL IMPRESSION(S) / ED DIAGNOSES   Final diagnoses:  Other infective acute otitis externa of right ear     Rx / DC Orders  ED Discharge Orders          Ordered    ciprofloxacin-dexamethasone (CIPRODEX) OTIC suspension  2 times daily        02/10/23 1216             Note:  This document was prepared using Dragon voice recognition software and may include unintentional dictation errors.    Lissa Hoard, PA-C 02/10/23 1253    Chesley Noon, MD 02/10/23 [id number]

## 2023-02-10 NOTE — Discharge Instructions (Signed)
Use the eardrops as directed.  Use a Vaseline-soaked cottonball while in the shower to prevent any water intrusion.  Use a dry eardrop in the ear after instilling the eardrops.  Follow-up with [facility] for ongoing evaluation.  Return to the ED if needed.

## 2023-02-10 NOTE — ED Triage Notes (Addendum)
Pt reports fullness and drainage to her right ear intermittently for the past few years. Pt reports this episode is the worst. Denies pain or upper resp congestion and drainage

## 2023-05-02 ENCOUNTER — Other Ambulatory Visit: Payer: Self-pay

## 2023-05-02 ENCOUNTER — Emergency Department: Payer: Medicaid Other

## 2023-05-02 ENCOUNTER — Emergency Department
Admission: EM | Admit: 2023-05-02 | Discharge: 2023-05-02 | Disposition: A | Discharge status: Home / Self Care | Payer: Medicaid Other | Attending: Emergency Medicine | Admitting: Emergency Medicine

## 2023-05-02 DIAGNOSIS — M7989 Other specified soft tissue disorders: Secondary | ICD-10-CM | POA: Diagnosis not present

## 2023-05-02 DIAGNOSIS — M722 Plantar fascial fibromatosis: Secondary | ICD-10-CM | POA: Diagnosis not present

## 2023-05-02 DIAGNOSIS — M79672 Pain in left foot: Secondary | ICD-10-CM | POA: Diagnosis present

## 2023-05-02 MED ORDER — NAPROXEN 500 MG PO TABS: 500.0000 mg | ORAL_TABLET | Freq: Two times a day (BID) | ORAL | 2 refills | Status: DC

## 2023-05-02 MED ORDER — KETOROLAC TROMETHAMINE 30 MG/ML IJ SOLN
30.0000 mg | Freq: Once | INTRAMUSCULAR | Status: AC
  Administered 2023-05-02: 30 mg via INTRAMUSCULAR
  Filled 2023-05-02: qty 1

## 2023-05-02 NOTE — ED Triage Notes (Signed)
Pt reports h/a and L ankle swelling. Denies known trauma or injury. Reports both h/a and ankle swelling since yesterday. Pt also reports unexplained bruising to thighs and a knot on her L ankle. Pt ambulatory to triage. Alert and oriented following commands. Breathing unlabored speaking in full sentences. Symmetric chest rise and fall.

## 2023-05-02 NOTE — ED Provider Notes (Signed)
   [facility] Provider Note    Event Date/Time   First MD Initiated Contact with Patient 05/02/23 2229     (approximate)   History   multiple complaints    HPI  Kimberly Mosley is a 27 y.o. female who presents with complaints of left foot pain.  Patient describes pain in the bottom of her foot and some mild swelling at the heel and ankle over the last day or so.  She reports she recently started a new job which requires lots of walking.  Denies fall or injury to the area     Physical Exam   Triage Vital Signs: ED Triage Vitals  Encounter Vitals Group     BP 05/02/23 2125 123/83     Systolic BP Percentile --      Diastolic BP Percentile --      Pulse Rate 05/02/23 2125 (!) 105     Resp 05/02/23 2125 18     Temp 05/02/23 2125 98.7 F (37.1 C)     Temp Source 05/02/23 2125 Oral     SpO2 05/02/23 2125 98 %     Weight 05/02/23 2126 77.1 kg (170 lb)     Height 05/02/23 2126 1.6 m (5\' 3" )     Head Circumference --      Peak Flow --      Pain Score 05/02/23 2125 6     Pain Loc --      Pain Education --      Exclude from Growth Chart --     Most recent vital signs: Vitals:   05/02/23 2125  BP: 123/83  Pulse: (!) 105  Resp: 18  Temp: 98.7 F (37.1 C)  SpO2: 98%     General: Awake, no distress.  CV:  Good peripheral perfusion.  Resp:  Normal effort.  Abd:  No distention.  Other:  Mild tenderness along the left plantar fascia, minimal swelling, no redness foreign bodies or evidence of infection   ED Results / Procedures / Treatments   Labs (all labs ordered are listed, but only abnormal results are displayed) Labs Reviewed - No data to display   EKG     RADIOLOGY Ankle x-ray viewed interpret by me, no fracture    PROCEDURES:  Critical Care performed:   Procedures   MEDICATIONS ORDERED IN ED: Medications  ketorolac (TORADOL) 30 MG/ML injection 30 mg (30 mg Intramuscular Given 05/02/23 2251)     IMPRESSION / MDM /  ASSESSMENT AND PLAN / ED COURSE  I reviewed the triage vital signs and the nursing notes. Patient's presentation is most consistent with acute complicated illness / injury requiring diagnostic workup.  Patient with foot pain as detailed above, x-rays are reassuring, not consistent with fracture or trauma.  Suspect plantar fasciitis, patient treated with IM Toradol here, Naprosyn prescription, outpatient follow-up with podiatry as needed.        FINAL CLINICAL IMPRESSION(S) / ED DIAGNOSES   Final diagnoses:  Plantar fasciitis     Rx / DC Orders   ED Discharge Orders          Ordered    naproxen (NAPROSYN) 500 MG tablet  2 times daily with meals        05/02/23 2238             Note:  This document was prepared using Dragon voice recognition software and may include unintentional dictation errors.   Jene Every, MD 05/02/23 2257

## 2023-06-27 ENCOUNTER — Encounter: Payer: Self-pay | Admitting: Family Medicine

## 2023-06-27 ENCOUNTER — Ambulatory Visit (INDEPENDENT_AMBULATORY_CARE_PROVIDER_SITE_OTHER): Payer: Medicaid Other | Admitting: Family Medicine

## 2023-06-27 VITALS — BP 118/64 | HR 82 | Temp 97.9°F | Ht 63.0 in | Wt 192.0 lb

## 2023-06-27 DIAGNOSIS — Z3169 Encounter for other general counseling and advice on procreation: Secondary | ICD-10-CM

## 2023-06-27 NOTE — Progress Notes (Signed)
New patient visit   Patient: Kimberly Mosley   DOB: 1996/04/23   27 y.o. Female  MRN: [id number] Visit Date: 06/27/2023  Today's healthcare provider: Ronnald Ramp, MD   Chief Complaint  Patient presents with   Establish Care   Subjective    Kimberly Mosley is a 27 y.o. female who presents today as a new patient to establish care.      Discussed the use of AI scribe software for clinical note transcription with the patient, who gave verbal consent to proceed.  History of Present Illness   The patient, with a history of anemia and Bell's palsy, presents for establishing care and to discuss concerns about anxiety and a past event that may have been a seizure. The patient recalls an incident a few years ago when she was donating plasma despite a history of anemia. During the donation, the machine malfunctioned, and the patient collapsed an hour later. She was seen in the ER but was not given a definitive diagnosis or referred to a neurologist. The patient has not experienced any similar episodes since.  The patient was diagnosed with Bell's palsy seven years ago, following the birth of her child. She also has a history of anemia, which was present before her pregnancy but seemed to improve during the pregnancy.  The patient is currently not on any medications and has no recent surgeries apart from an abscess drainage. She has a history of childhood trauma and relationship trauma, which she believes contributes to her current anxiety. She expresses a desire to speak with a therapist about these issues.  The patient also expresses a desire to have another child. She has been in a relationship for four years, and despite not using protection, she has not conceived. The patient's partner had a previous accident that may have affected his fertility. The patient has one child and believes she can carry another pregnancy.  The patient describes her mood as "up, but tired." She reports  some days where she feels down and just wants to sleep, but she does not have thoughts of self-harm or harm to others. She has been trying to manage her mood and anxiety independently but is open to medication if necessary. The patient also expresses concerns about her socioeconomic status and the pressure she feels to meet certain life milestones.          06/27/2023    3:57 PM 07/20/2022   10:07 AM 11/23/2021    3:59 PM 11/02/2021    2:52 PM  GAD 7 : Generalized Anxiety Score  Nervous, Anxious, on Edge 0 0 2 3  Control/stop worrying 0 0 1 3  Worry too much - different things 0 0 1 3  Trouble relaxing 0 0 1 3  Restless 2 0 2 3  Easily annoyed or irritable 0 0 2 3  Afraid - awful might happen 0 0 1 3  Total GAD 7 Score 2 0 10 21  Anxiety Difficulty Somewhat difficult Not difficult at all      [facility] Office Visit from 06/27/2023 in [facility]  PHQ-9 Total Score 9         Past Medical History:  Diagnosis Date   Anemia    Anxiety    situational   H/O Bell's palsy    Medical history non-contributory    Seizures (HCC)     Outpatient Medications Prior to Visit  Medication Sig   [DISCONTINUED] naproxen (NAPROSYN) 500  MG tablet Take 1 tablet (500 mg total) by mouth 2 (two) times daily with a meal.   [DISCONTINUED] predniSONE (DELTASONE) 20 MG tablet Take 60mg  PO daily x 2 days, then40mg  PO daily x 2 days, then 20mg  PO daily x 3 days   No facility-administered medications prior to visit.    Past Surgical History:  Procedure Laterality Date   INCISION AND DRAINAGE ABSCESS Left 12/24/2019   Procedure: INCISION AND DRAINAGE ABSCESS;  Surgeon: Carolan Shiver, MD;  Location: [facility] ORS;  Service: General;  Laterality: Left;   NO PAST SURGERIES     Family Status  Relation Name Status   Father  Alive   Mother  Alive   Sister  Alive   Brother  Alive   Nurse, mental health  (Not Specified)   MGM  Alive   MGF  Alive   Neg Hx  (Not Specified)  No  partnership data on file   Family History  Problem Relation Age of Onset   Depression Father    Bipolar disorder Father    Healthy Mother    Healthy Sister    Healthy Brother    Cancer Maternal Uncle        pancreatic cancer   Heart disease Maternal Grandmother    Stroke Maternal Grandmother    Healthy Maternal Grandfather    Breast cancer Neg Hx    Colon cancer Neg Hx    Ovarian cancer Neg Hx    Social History   Socioeconomic History   Marital status: Single    Spouse name: Not on file   Number of children: 1   Years of education: Not on file   Highest education level: GED or equivalent  Occupational History   Not on file  Tobacco Use   Smoking status: Former    Current packs/day: 0.00    Average packs/day: 0.5 packs/day for 7.0 years (3.5 ttl pk-yrs)    Types: Cigarettes    Start date: 01/04/2015    Quit date: 01/03/2022    Years since quitting: 1.4   Smokeless tobacco: Never  Vaping Use   Vaping status: Never Used  Substance and Sexual Activity   Alcohol use: Not Currently    Alcohol/week: 2.0 standard drinks of alcohol    Types: 2 Cans of beer per week    Comment: Last ETOH use 2 days ago.   Drug use: No   Sexual activity: Yes    Birth control/protection: None    Comment: Nexplanon removed 12/2018 with no birth control used since removal.  Other Topics Concern   Not on file  Social History Narrative   Not on file   Social Determinants of Health   Financial Resource Strain: Low Risk  (06/26/2023)   Overall Financial Resource Strain (CARDIA)    Difficulty of Paying Living Expenses: Not hard at all  Food Insecurity: No Food Insecurity (06/26/2023)   Hunger Vital Sign    Worried About Running Out of Food in the Last Year: Never true    Ran Out of Food in the Last Year: Never true  Transportation Needs: No Transportation Needs (06/26/2023)   PRAPARE - Administrator, Civil Service (Medical): No    Lack of Transportation (Non-Medical): No  Physical  Activity: Sufficiently Active (06/26/2023)   Exercise Vital Sign    Days of Exercise per Week: 7 days    Minutes of Exercise per Session: 60 min  Stress: No Stress Concern Present (06/26/2023)   [employer] of Occupational  Health - Occupational Stress Questionnaire    Feeling of Stress : Only a little  Social Connections: Socially Isolated (06/26/2023)   Social Connection and Isolation Panel [NHANES]    Frequency of Communication with Friends and Family: Once a week    Frequency of Social Gatherings with Friends and Family: Once a week    Attends Religious Services: More than 4 times per year    Active Member of Clubs or Organizations: No    Attends Engineer, structural: Not on file    Marital Status: Never married     No Known Allergies  Immunization History  Administered Date(s) Administered   MMR 01/14/2016   Tdap 01/14/2016    Health Maintenance  Topic Date Due   COVID-19 Vaccine (1) Never done   HIV Screening  Never done   Hepatitis C Screening  Never done   INFLUENZA VACCINE  Never done   PAP-Cervical Cytology Screening  01/17/2025   PAP SMEAR-Modifier  01/17/2025   DTaP/Tdap/Td (2 - Td or Tdap) 01/13/2026   HPV VACCINES  Aged Out    Patient Care Team: Ronnald Ramp, MD as PCP - General (Family Medicine)  Review of Systems      Objective    BP 118/64 (BP Location: Left Arm, Patient Position: Sitting, Cuff Size: Large)   Pulse 82   Temp 97.9 F (36.6 C) (Oral)   Ht 5\' 3"  (1.6 m)   Wt 192 lb (87.1 kg)   SpO2 98%   BMI 34.01 kg/m      Depression Screen    06/27/2023    3:56 PM 07/20/2022   10:07 AM 11/23/2021    3:59 PM 11/06/2021    3:37 PM  PHQ 2/9 Scores  PHQ - 2 Score 0 0 1 2  PHQ- 9 Score 9 0 3    No results found for any visits on 06/27/23.   Physical Exam Vitals reviewed.  Constitutional:      General: She is not in acute distress.    Appearance: Normal appearance. She is not ill-appearing, toxic-appearing or  diaphoretic.  Eyes:     Conjunctiva/sclera: Conjunctivae normal.  Cardiovascular:     Rate and Rhythm: Normal rate and regular rhythm.     Pulses: Normal pulses.     Heart sounds: Normal heart sounds. No murmur heard.    No friction rub. No gallop.  Pulmonary:     Effort: Pulmonary effort is normal. No respiratory distress.     Breath sounds: Normal breath sounds. No stridor. No wheezing, rhonchi or rales.  Abdominal:     General: Bowel sounds are normal. There is no distension.     Palpations: Abdomen is soft.     Tenderness: There is no abdominal tenderness.  Musculoskeletal:     Right lower leg: No edema.     Left lower leg: No edema.  Skin:    Findings: No erythema or rash.  Neurological:     Mental Status: She is alert and oriented to person, place, and time.  Psychiatric:        Mood and Affect: Affect normal. Mood is depressed.        Speech: Speech normal.        Behavior: Behavior normal. Behavior is cooperative.        Thought Content: Thought content does not include suicidal ideation. Thought content does not include suicidal plan.        Assessment & Plan      Problem List Items  Addressed This Visit   None Visit Diagnoses     Infertility counseling    -  Primary   Relevant Orders   Ambulatory referral to Endocrinology   Encounter for preconception consultation       Relevant Orders   Ambulatory referral to Endocrinology       Assessment and Plan    Anxiety and Depression Reports of situational anxiety and depressive symptoms, including fatigue and low mood. History of childhood and relationship trauma. No suicidal ideation. High PHQ-9 score suggestive of depression. -Referral to Dr. Edwin Dada for psychiatric evaluation and management. -Consider starting a prenatal vitamin for overall health and potential mood benefits.  Anemia History of anemia, improved during pregnancy. Currently asymptomatic. -Schedule a physical exam in 5 weeks, including blood  work to assess for anemia.  Seizure-like event History of a single event following plasma donation, possibly seizure-like, but no formal diagnosis or treatment. No recurrence. -No immediate action required, monitor for recurrence.  Family Planning Desire for another child, partner has history of trauma affecting fertility. -Referral for fertility evaluation.  General Health Maintenance -Continue regular Pap smears, last one was normal. -Consider starting a prenatal vitamin in anticipation of potential pregnancy.          Return in about 5 weeks (around 08/01/2023) for CPE.      Ronnald Ramp, MD  [facility] [phone] (phone) [phone] (fax)  [facility]

## 2023-06-27 NOTE — Patient Instructions (Addendum)
Caryn Section, MD, PA [address], [place], [place] [address] [phone]   VISIT SUMMARY:  During our visit, we discussed your history of anemia and Bell's palsy, your concerns about anxiety and a past event that may have been a seizure, and your desire to have another child. We also talked about your mood and the pressures you feel due to your socioeconomic status.  YOUR PLAN:  -ANXIETY AND DEPRESSION: You've been experiencing symptoms of anxiety and depression, such as fatigue and low mood. I'm referring you to Dr. Edwin Dada for a psychiatric evaluation and management. We also discussed the potential benefits of starting a prenatal vitamin for your overall health and mood.  -ANEMIA: You have a history of anemia, which improved during your pregnancy. We'll schedule a physical exam in 5 weeks, including blood work, to check for anemia.  -SEIZURE-LIKE EVENT: You had a single event that may have been a seizure after donating plasma. We'll monitor this and take action if it happens again.  -FAMILY PLANNING: You expressed a desire to have another child. I'm referring you for a fertility evaluation due to your partner's history of trauma that may affect fertility.  -GENERAL HEALTH MAINTENANCE: Continue with your regular Pap smears. Your last one was normal. Also, consider starting a prenatal vitamin in anticipation of a potential pregnancy.  INSTRUCTIONS:  Please schedule a physical exam in 5 weeks, including blood work to check for anemia. Also, make an appointment with Dr. Edwin Dada for a psychiatric evaluation and management. Lastly, schedule a fertility evaluation.

## 2023-08-01 ENCOUNTER — Encounter: Payer: Medicaid Other | Admitting: Family Medicine

## 2023-08-01 DIAGNOSIS — Z0000 Encounter for general adult medical examination without abnormal findings: Secondary | ICD-10-CM | POA: Insufficient documentation

## 2023-08-01 NOTE — Progress Notes (Deleted)
Complete physical exam   Patient: Kimberly Mosley   DOB: July 08, 1996   27 y.o. Female  MRN: [id number] Visit Date: 08/01/2023  Today's healthcare provider: Ronnald Ramp, MD   No chief complaint on file.  Subjective    Kimberly Mosley is a 27 y.o. female who presents today for a complete physical exam.   She reports consuming a {diet types:17450} diet.   {Exercise:19826}   She generally feels {well/fairly well/poorly:18703}.   She reports sleeping {well/fairly well/poorly:18703}.    She {does/does not:200015} have additional problems to discuss today.   Discussed the use of AI scribe software for clinical note transcription with the patient, who gave verbal consent to proceed.  History of Present Illness             Past Medical History:  Diagnosis Date   Anemia    Anxiety    situational   H/O Bell's palsy    Medical history non-contributory    Seizures (HCC)    Past Surgical History:  Procedure Laterality Date   INCISION AND DRAINAGE ABSCESS Left 12/24/2019   Procedure: INCISION AND DRAINAGE ABSCESS;  Surgeon: Carolan Shiver, MD;  Location: [facility] ORS;  Service: General;  Laterality: Left;   NO PAST SURGERIES     Social History   Socioeconomic History   Marital status: Single    Spouse name: Not on file   Number of children: 1   Years of education: Not on file   Highest education level: GED or equivalent  Occupational History   Not on file  Tobacco Use   Smoking status: Former    Current packs/day: 0.00    Average packs/day: 0.5 packs/day for 7.0 years (3.5 ttl pk-yrs)    Types: Cigarettes    Start date: 01/04/2015    Quit date: 01/03/2022    Years since quitting: 1.5   Smokeless tobacco: Never  Vaping Use   Vaping status: Never Used  Substance and Sexual Activity   Alcohol use: Not Currently    Alcohol/week: 2.0 standard drinks of alcohol    Types: 2 Cans of beer per week    Comment: Last ETOH use 2 days ago.   Drug use: No    Sexual activity: Yes    Birth control/protection: None    Comment: Nexplanon removed 12/2018 with no birth control used since removal.  Other Topics Concern   Not on file  Social History Narrative   Not on file   Social Determinants of Health   Financial Resource Strain: Low Risk  (06/26/2023)   Overall Financial Resource Strain (CARDIA)    Difficulty of Paying Living Expenses: Not hard at all  Food Insecurity: No Food Insecurity (06/26/2023)   Hunger Vital Sign    Worried About Running Out of Food in the Last Year: Never true    Ran Out of Food in the Last Year: Never true  Transportation Needs: No Transportation Needs (06/26/2023)   PRAPARE - Administrator, Civil Service (Medical): No    Lack of Transportation (Non-Medical): No  Physical Activity: Sufficiently Active (06/26/2023)   Exercise Vital Sign    Days of Exercise per Week: 7 days    Minutes of Exercise per Session: 60 min  Stress: No Stress Concern Present (06/26/2023)   [employer] of Occupational Health - Occupational Stress Questionnaire    Feeling of Stress : Only a little  Social Connections: Socially Isolated (06/26/2023)   Social Connection and Isolation Panel [NHANES]  Frequency of Communication with Friends and Family: Once a week    Frequency of Social Gatherings with Friends and Family: Once a week    Attends Religious Services: More than 4 times per year    Active Member of [employer] or Organizations: No    Attends Engineer, structural: Not on file    Marital Status: Never married  Intimate Partner Violence: Not At Risk (09/01/2019)   Humiliation, Afraid, Rape, and Kick questionnaire    Fear of Current or Ex-Partner: No    Emotionally Abused: No    Physically Abused: No    Sexually Abused: No   Family Status  Relation Name Status   Father  Alive   Mother  Alive   Sister  Alive   Brother  Alive   Nurse, mental health  (Not Specified)   MGM  Alive   MGF  Alive   Neg Hx  (Not Specified)  No  partnership data on file   Family History  Problem Relation Age of Onset   Depression Father    Bipolar disorder Father    Healthy Mother    Healthy Sister    Healthy Brother    Cancer Maternal Uncle        pancreatic cancer   Heart disease Maternal Grandmother    Stroke Maternal Grandmother    Healthy Maternal Grandfather    Breast cancer Neg Hx    Colon cancer Neg Hx    Ovarian cancer Neg Hx    No Known Allergies   Medications: No outpatient medications prior to visit.   No facility-administered medications prior to visit.    Review of Systems  {Insert previous labs (optional):23779} {See past labs  Heme  Chem  Endocrine  Serology  Results Review (optional):1}  Objective    There were no vitals taken for this visit. BP Readings from Last 3 Encounters:  06/27/23 118/64  05/02/23 123/83  08/31/22 117/71   Wt Readings from Last 3 Encounters:  06/27/23 192 lb (87.1 kg)  05/02/23 170 lb (77.1 kg)  02/10/23 167 lb 8.8 oz (76 kg)    {See vitals history (optional):1}    Physical Exam  ***  Last depression screening scores    06/27/2023    3:56 PM 07/20/2022   10:07 AM 11/23/2021    3:59 PM  PHQ 2/9 Scores  PHQ - 2 Score 0 0 1  PHQ- 9 Score 9 0 3    Last fall risk screening    07/20/2022   10:07 AM  Fall Risk   Falls in the past year? 0  Number falls in past yr: 0  Injury with Fall? 0  Risk for fall due to : No Fall Risks  Follow up Falls evaluation completed    Last Audit-C alcohol use screening    06/26/2023   12:21 PM  Alcohol Use Disorder Test (AUDIT)  1. How often do you have a drink containing alcohol? 0  2. How many drinks containing alcohol do you have on a typical day when you are drinking? 0  3. How often do you have six or more drinks on one occasion? 0  AUDIT-C Score 0   A score of 3 or more in women, and 4 or more in men indicates increased risk for alcohol abuse, EXCEPT if all of the points are from question 1   No results found  for any visits on 08/01/23.  Assessment & Plan    Routine Health Maintenance and Physical Exam  Immunization History  Administered Date(s) Administered   MMR 01/14/2016   Tdap 01/14/2016    Health Maintenance  Topic Date Due   COVID-19 Vaccine (1) Never done   HIV Screening  Never done   Hepatitis C Screening  Never done   INFLUENZA VACCINE  01/20/2024 (Originally 05/23/2023)   Cervical Cancer Screening (Pap smear)  01/17/2025   DTaP/Tdap/Td (2 - Td or Tdap) 01/13/2026   HPV VACCINES  Aged Out    Problem List Items Addressed This Visit     Annual physical exam - Primary   Anxiety and depression   Class 1 obesity due to excess calories without serious comorbidity with body mass index (BMI) of 32.0 to 32.9 in adult   Other Visit Diagnoses     Encounter for hepatitis C screening test for low risk patient       Screening for lipid disorders       Screening for HIV (human immunodeficiency virus)       Screening for diabetes mellitus       Screening for deficiency anemia           Assessment and Plan                No follow-ups on file.       Ronnald Ramp, MD  [facility] [phone] (phone) [phone] (fax)  [facility]

## 2023-08-05 ENCOUNTER — Other Ambulatory Visit: Payer: Self-pay

## 2023-08-05 ENCOUNTER — Emergency Department
Admission: EM | Admit: 2023-08-05 | Discharge: 2023-08-05 | Disposition: A | Discharge status: Home / Self Care | Payer: Medicaid Other | Attending: Emergency Medicine | Admitting: Emergency Medicine

## 2023-08-05 DIAGNOSIS — R42 Dizziness and giddiness: Secondary | ICD-10-CM | POA: Insufficient documentation

## 2023-08-05 LAB — URINALYSIS, ROUTINE W REFLEX MICROSCOPIC
Bilirubin Urine: NEGATIVE
Glucose, UA: NEGATIVE mg/dL
Hgb urine dipstick: NEGATIVE
Ketones, ur: 5 mg/dL — AB
Nitrite: NEGATIVE
Protein, ur: NEGATIVE mg/dL
Specific Gravity, Urine: 1.015 (ref 1.005–1.030)
pH: 5 (ref 5.0–8.0)

## 2023-08-05 LAB — CBC
HCT: 43.6 % (ref 36.0–46.0)
Hemoglobin: 14.5 g/dL (ref 12.0–15.0)
MCH: 29.4 pg (ref 26.0–34.0)
MCHC: 33.3 g/dL (ref 30.0–36.0)
MCV: 88.4 fL (ref 80.0–100.0)
Platelets: 277 10*3/uL (ref 150–400)
RBC: 4.93 MIL/uL (ref 3.87–5.11)
RDW: 12.4 % (ref 11.5–15.5)
WBC: 11.4 10*3/uL — ABNORMAL HIGH (ref 4.0–10.5)
nRBC: 0 % (ref 0.0–0.2)

## 2023-08-05 LAB — BASIC METABOLIC PANEL
Anion gap: 10 (ref 5–15)
BUN: 10 mg/dL (ref 6–20)
CO2: 21 mmol/L — ABNORMAL LOW (ref 22–32)
Calcium: 9.5 mg/dL (ref 8.9–10.3)
Chloride: 104 mmol/L (ref 98–111)
Creatinine, Ser: 0.71 mg/dL (ref 0.44–1.00)
GFR, Estimated: >60 mL/min (ref >60)
Glucose, Bld: 104 mg/dL — ABNORMAL HIGH (ref 70–99)
Potassium: 4.8 mmol/L (ref 3.5–5.1)
Sodium: 135 mmol/L (ref 135–145)

## 2023-08-05 LAB — POC URINE PREG, ED: Preg Test, Ur: NEGATIVE

## 2023-08-05 NOTE — ED Provider Notes (Signed)
[facility] Provider Note    Event Date/Time   First MD Initiated Contact with Patient 08/05/23 1653     (approximate)   History   Chief Complaint Dizziness   HPI  Kimberly Mosley is a 27 y.o. female with past medical history of anxiety and anemia who presents to the ED complaining of dizziness.  Patient reports that earlier today she began to feel dizzy and lightheaded with a cold feeling that washed over her entire body.  She denies any associated chest pain or shortness of breath, now states that symptoms have seemed to improve.  She has otherwise felt well recently with no fevers, cough, nausea, vomiting, diarrhea, or dysuria.     Physical Exam   Triage Vital Signs: ED Triage Vitals  Encounter Vitals Group     BP 08/05/23 1621 (!) 158/93     Systolic BP Percentile --      Diastolic BP Percentile --      Pulse Rate 08/05/23 1620 97     Resp 08/05/23 1620 17     Temp 08/05/23 1620 98.2 F (36.8 C)     Temp Source 08/05/23 1620 Oral     SpO2 08/05/23 1620 95 %     Weight 08/05/23 1621 190 lb (86.2 kg)     Height 08/05/23 1621 5\' 3"  (1.6 m)     Head Circumference --      Peak Flow --      Pain Score 08/05/23 1621 0     Pain Loc --      Pain Education --      Exclude from Growth Chart --     Most recent vital signs: Vitals:   08/05/23 1620 08/05/23 1621  BP:  (!) 158/93  Pulse: 97   Resp: 17   Temp: 98.2 F (36.8 C)   SpO2: 95%     Constitutional: Alert and oriented. Eyes: Conjunctivae are normal. Head: Atraumatic. Nose: No congestion/rhinnorhea. Mouth/Throat: Mucous membranes are moist.  Cardiovascular: Normal rate, regular rhythm. Grossly normal heart sounds.  2+ radial pulses bilaterally. Respiratory: Normal respiratory effort.  No retractions. Lungs CTAB. Gastrointestinal: Soft and nontender. No distention. Musculoskeletal: No lower extremity tenderness nor edema.  Neurologic:  Normal speech and language. No gross focal  neurologic deficits are appreciated.    ED Results / Procedures / Treatments   Labs (all labs ordered are listed, but only abnormal results are displayed) Labs Reviewed  BASIC METABOLIC PANEL - Abnormal; Notable for the following components:      Result Value   CO2 21 (*)    Glucose, Bld 104 (*)    All other components within normal limits  URINALYSIS, ROUTINE W REFLEX MICROSCOPIC - Abnormal; Notable for the following components:   Color, Urine YELLOW (*)    APPearance CLOUDY (*)    Ketones, ur 5 (*)    Leukocytes,Ua MODERATE (*)    Bacteria, UA FEW (*)    All other components within normal limits  CBC - Abnormal; Notable for the following components:   WBC 11.4 (*)    All other components within normal limits  CBG MONITORING, ED  POC URINE PREG, ED     EKG  ED ECG REPORT I, Chesley Noon, the attending physician, personally viewed and interpreted this ECG.   Date: 08/05/2023  EKG Time: 17:13  Rate: 105  Rhythm: sinus tachycardia  Axis: Normal  Intervals:none  ST&T Change: None  PROCEDURES:  Critical Care performed: No  Procedures  MEDICATIONS ORDERED IN ED: Medications - No data to display   IMPRESSION / MDM / ASSESSMENT AND PLAN / ED COURSE  I reviewed the triage vital signs and the nursing notes.                              27 y.o. female with past medical history of anxiety and anemia who presents to the ED complaining of dizziness and lightheadedness with cold feeling washing over her entire body earlier today.  Patient's presentation is most consistent with acute presentation with potential threat to life or bodily function.  Differential diagnosis includes, but is not limited to, arrhythmia, vasovagal episode, orthostatic hypotension, dehydration, electrolyte abnormality, AKI, anemia, pregnancy, UTI.  Patient nontoxic-appearing and in no acute distress, vital signs are unremarkable.  EKG shows no evidence of arrhythmia or ischemia, pregnancy  testing is negative and urinalysis shows no signs of infection.  Labs are pending at this time, will reassess following additional results.  Labs show no significant anemia, leukocytosis, electrolyte abnormality, or AKI.  Patient is asymptomatic on reassessment and is appropriate for discharge home given reassuring workup.  She was counseled to return to the ED for new or worsening symptoms, patient agrees with plan.      FINAL CLINICAL IMPRESSION(S) / ED DIAGNOSES   Final diagnoses:  Lightheadedness  Dizziness     Rx / DC Orders   ED Discharge Orders     None        Note:  This document was prepared using Dragon voice recognition software and may include unintentional dictation errors.   Chesley Noon, MD 08/05/23 1754

## 2023-08-05 NOTE — ED Triage Notes (Signed)
Pt states that she got shaky and chills about an hour PTA. Pt states that she thought she was just having hunger pains so she ate some tuna and crackers. Pt states that after eating she is still dizzy and shaky.

## 2023-08-06 ENCOUNTER — Ambulatory Visit
Admission: RE | Admit: 2023-08-06 | Discharge: 2023-08-06 | Disposition: A | Discharge status: Home / Self Care | Payer: Medicaid Other | Source: Ambulatory Visit | Attending: Family Medicine

## 2023-08-06 ENCOUNTER — Encounter: Payer: Self-pay | Admitting: Family Medicine

## 2023-08-06 ENCOUNTER — Ambulatory Visit
Admission: RE | Admit: 2023-08-06 | Discharge: 2023-08-06 | Disposition: A | Discharge status: Home / Self Care | Payer: Medicaid Other | Attending: Family Medicine | Admitting: Family Medicine

## 2023-08-06 ENCOUNTER — Ambulatory Visit (INDEPENDENT_AMBULATORY_CARE_PROVIDER_SITE_OTHER): Payer: Medicaid Other | Admitting: Family Medicine

## 2023-08-06 VITALS — BP 125/73 | HR 88 | Temp 98.0°F | Resp 16 | Ht 63.0 in | Wt 196.2 lb

## 2023-08-06 DIAGNOSIS — F419 Anxiety disorder, unspecified: Secondary | ICD-10-CM

## 2023-08-06 DIAGNOSIS — R0789 Other chest pain: Secondary | ICD-10-CM | POA: Diagnosis not present

## 2023-08-06 DIAGNOSIS — R450 Nervousness: Secondary | ICD-10-CM

## 2023-08-06 DIAGNOSIS — F32A Depression, unspecified: Secondary | ICD-10-CM | POA: Diagnosis not present

## 2023-08-06 DIAGNOSIS — R5383 Other fatigue: Secondary | ICD-10-CM | POA: Diagnosis not present

## 2023-08-06 MED ORDER — BUSPIRONE HCL 5 MG PO TABS
5.0000 mg | ORAL_TABLET | Freq: Two times a day (BID) | ORAL | 2 refills | Status: AC
Start: 2023-08-06 — End: ?

## 2023-08-06 NOTE — Patient Instructions (Signed)
VISIT SUMMARY:  Dear Kimberly Mosley, during your recent visit, we discussed your symptoms of fatigue, chest tightness, hunger pains, shaking, dizziness, and lightheadedness. We also talked about your high level of stress due to your current move. We considered your past medical history, including anemia during your teenage years, and your recent visit to the emergency department.  YOUR PLAN:  -CHEST TIGHTNESS AND FATIGUE: Your symptoms of chest tightness and fatigue could be due to a number of conditions. We want to rule out thyroid disease and diabetes, which can cause these symptoms. Therefore, we will conduct some blood tests including TSH, T4, T3, and Hemoglobin A1c.  -ANXIETY: Your high level of stress and anxiety could be contributing to your symptoms. We will try a new medication, Buspirone, which can help manage anxiety. This is different from the fluoxetine and Celexa you've tried before.  INSTRUCTIONS:  Please get the blood tests done as soon as possible. You can start taking Buspirone 5mg  as needed for anxiety. If your symptoms persist or worsen, please contact us immediately.

## 2023-08-06 NOTE — Progress Notes (Unsigned)
Established patient visit   Patient: Kimberly Mosley   DOB: 1996/05/03   27 y.o. Female  MRN: [id number] Visit Date: 08/06/2023  Today's healthcare provider: Ronnald Ramp, MD   Chief Complaint  Patient presents with   Fatigue    Exhausted and super tired , yesterday started shaking got dizzy and light headed Was at ER lastnight they stated heart rate was up and been having hungry pains   Subjective     HPI     Fatigue    Additional comments: Exhausted and super tired , yesterday started shaking got dizzy and light headed Was at ER lastnight they stated heart rate was up and been having hungry pains      Last edited by Clois Comber on 08/06/2023  1:25 PM.       Discussed the use of AI scribe software for clinical note transcription with the patient, who gave verbal consent to proceed.  History of Present Illness   [name], a patient with a recent ED visit, presents with a constellation of symptoms including fatigue, chest tightness, hunger pains, shaking, dizziness, and lightheadedness. Despite getting 8-10 hours of sleep, she reports persistent tiredness. The chest tightness, described as a sensation of tightness in the heart area, has been occurring intermittently and increasing in frequency. She also experiences hunger pains and shaking, which are not necessarily concurrent with the chest tightness but tend to occur the following day. The most recent episode included dizziness and a sensation of being cold from head to toe, which led to an ED visit. She also acknowledges a high level of stress due to a current move. She has a history of anemia in her teenage years but denies any current symptoms of anemia such as coughing, fevers, or chills. She does not take any vitamins or supplements.         Past Medical History:  Diagnosis Date   Anemia    Anxiety    situational   H/O Bell's palsy    Medical history non-contributory    Seizures (HCC)      Medications: No outpatient medications prior to visit.   No facility-administered medications prior to visit.    Review of Systems  Last CBC Lab Results  Component Value Date   WBC 11.4 (H) 08/05/2023   HGB 14.5 08/05/2023   HCT 43.6 08/05/2023   MCV 88.4 08/05/2023   MCH 29.4 08/05/2023   RDW 12.4 08/05/2023   PLT 277 08/05/2023   Last metabolic panel Lab Results  Component Value Date   GLUCOSE 104 (H) 08/05/2023   NA 135 08/05/2023   K 4.8 08/05/2023   CL 104 08/05/2023   CO2 21 (L) 08/05/2023   BUN 10 08/05/2023   CREATININE 0.71 08/05/2023   GFRNONAA >60 08/05/2023   CALCIUM 9.5 08/05/2023   PROT 6.8 10/30/2021   ALBUMIN 4.7 10/30/2021   LABGLOB 2.1 10/30/2021   AGRATIO 2.2 10/30/2021   BILITOT <0.2 10/30/2021   ALKPHOS 68 10/30/2021   AST 23 10/30/2021   ALT 17 10/30/2021   ANIONGAP 10 08/05/2023   Last lipids No results found for: "CHOL", "HDL", "LDLCALC", "LDLDIRECT", "TRIG", "CHOLHDL" Last hemoglobin A1c Lab Results  Component Value Date   HGBA1C 5.1 10/30/2021   Last thyroid functions Lab Results  Component Value Date   TSH 1.120 10/30/2021   Last vitamin D Lab Results  Component Value Date   VD25OH 30 05/01/2017   Last vitamin B12 and Folate No  results found for: "VITAMINB12", "FOLATE"   {See past labs  Heme  Chem  Endocrine  Serology  Results Review (optional):1}   Objective    BP 125/73 (BP Location: Right Arm, Patient Position: Sitting, Cuff Size: Normal)   Pulse 88   Temp 98 F (36.7 C)   Resp 16   Ht 5\' 3"  (1.6 m)   Wt 196 lb 3.2 oz (89 kg)   LMP 07/15/2023 (Approximate)   SpO2 99%   BMI 34.76 kg/m   BP Readings from Last 3 Encounters:  08/06/23 125/73  08/05/23 (!) 158/93  06/27/23 118/64   Wt Readings from Last 3 Encounters:  08/06/23 196 lb 3.2 oz (89 kg)  08/05/23 190 lb (86.2 kg)  06/27/23 192 lb (87.1 kg)    {See vitals history (optional):1}   Physical Exam  ***  No results found for any visits  on 08/06/23.  Assessment & Plan     Problem List Items Addressed This Visit   None Visit Diagnoses     Jittery    -  Primary   Relevant Medications   busPIRone (BUSPAR) 5 MG tablet   Other Relevant Orders   Hemoglobin A1c   TSH+T4F+T3Free   Other fatigue       Relevant Orders   Hemoglobin A1c   TSH+T4F+T3Free   Chest tightness       Relevant Orders   DG Chest 2 View        Chest Tightness and Fatigue Patient reports intermittent chest tightness and chronic fatigue. No cough, fever, or sore throat. Normal heart and lung sounds on examination. No evidence of anemia or thyroid enlargement. -Order TSH, T4, T3, and Hemoglobin A1c to rule out thyroid disease and diabetes. cxr ordered for chest tightness  Anxiety Patient reports significant stress and anxiety, which may be contributing to her symptoms. Previous negative experience with fluoxetine and Celexa. -Prescribe Buspirone 5mg  as needed for anxiety.         Return in about 2 weeks (around 08/20/2023) for nervous, fatigue.         Ronnald Ramp, MD  [facility] [phone] (phone) [phone] (fax)  [facility]

## 2023-08-07 NOTE — Assessment & Plan Note (Signed)
Patient reports significant stress and anxiety, which may be contributing to her symptoms. Previous negative experience with fluoxetine and Celexa. -Prescribe Buspirone 5mg  as needed for anxiety.

## 2023-08-16 NOTE — Progress Notes (Deleted)
      Established patient visit   Patient: Kimberly Mosley   DOB: August 27, 1996   27 y.o. Female  MRN: [id number] Visit Date: 08/19/2023  Today's healthcare provider: Ronnald Ramp, MD   No chief complaint on file.  Subjective       Discussed the use of AI scribe software for clinical note transcription with the patient, who gave verbal consent to proceed.  History of Present Illness           Follow up for A1c and thyroid studies, normal CXR on 08/06/23. Started on buspar***   Past Medical History:  Diagnosis Date   Anemia    Anxiety    situational   H/O Bell's palsy    Medical history non-contributory    Seizures (HCC)     Medications: Outpatient Medications Prior to Visit  Medication Sig   busPIRone (BUSPAR) 5 MG tablet Take 1 tablet (5 mg total) by mouth 2 (two) times daily.   No facility-administered medications prior to visit.    Review of Systems  Last CBC Lab Results  Component Value Date   WBC 11.4 (H) 08/05/2023   HGB 14.5 08/05/2023   HCT 43.6 08/05/2023   MCV 88.4 08/05/2023   MCH 29.4 08/05/2023   RDW 12.4 08/05/2023   PLT 277 08/05/2023   Last metabolic panel Lab Results  Component Value Date   GLUCOSE 104 (H) 08/05/2023   NA 135 08/05/2023   K 4.8 08/05/2023   CL 104 08/05/2023   CO2 21 (L) 08/05/2023   BUN 10 08/05/2023   CREATININE 0.71 08/05/2023   GFRNONAA >60 08/05/2023   CALCIUM 9.5 08/05/2023   PROT 6.8 10/30/2021   ALBUMIN 4.7 10/30/2021   LABGLOB 2.1 10/30/2021   AGRATIO 2.2 10/30/2021   BILITOT <0.2 10/30/2021   ALKPHOS 68 10/30/2021   AST 23 10/30/2021   ALT 17 10/30/2021   ANIONGAP 10 08/05/2023   Last lipids No results found for: "CHOL", "HDL", "LDLCALC", "LDLDIRECT", "TRIG", "CHOLHDL" Last hemoglobin A1c Lab Results  Component Value Date   HGBA1C 5.1 10/30/2021   Last thyroid functions Lab Results  Component Value Date   TSH 1.120 10/30/2021   Last vitamin D Lab Results  Component Value Date    VD25OH 30 05/01/2017   Last vitamin B12 and Folate No results found for: "VITAMINB12", "FOLATE"   {See past labs  Heme  Chem  Endocrine  Serology  Results Review (optional):1}   Objective    LMP 07/15/2023 (Approximate)  BP Readings from Last 3 Encounters:  08/06/23 125/73  08/05/23 (!) 158/93  06/27/23 118/64   Wt Readings from Last 3 Encounters:  08/06/23 196 lb 3.2 oz (89 kg)  08/05/23 190 lb (86.2 kg)  06/27/23 192 lb (87.1 kg)    {See vitals history (optional):1}   Physical Exam  ***  No results found for any visits on 08/19/23.  Assessment & Plan     Problem List Items Addressed This Visit   None Visit Diagnoses     Jittery    -  Primary   Other fatigue           Assessment and Plan              No follow-ups on file.         Ronnald Ramp, MD  [facility] [phone] (phone) [phone] (fax)  [facility]

## 2023-08-19 ENCOUNTER — Ambulatory Visit: Payer: Medicaid Other | Admitting: Family Medicine

## 2023-08-19 DIAGNOSIS — R5383 Other fatigue: Secondary | ICD-10-CM

## 2023-08-19 DIAGNOSIS — R450 Nervousness: Secondary | ICD-10-CM

## 2023-09-02 ENCOUNTER — Other Ambulatory Visit: Payer: Self-pay | Admitting: Family Medicine

## 2023-09-02 DIAGNOSIS — N468 Other male infertility: Secondary | ICD-10-CM

## 2023-09-02 NOTE — Progress Notes (Signed)
New referral for infertility evaluation

## 2023-09-06 ENCOUNTER — Encounter: Payer: Self-pay | Admitting: Family Medicine

## 2023-09-26 ENCOUNTER — Ambulatory Visit: Payer: Medicaid Other | Admitting: Family Medicine

## 2023-09-26 ENCOUNTER — Emergency Department
Admission: EM | Admit: 2023-09-26 | Discharge: 2023-09-26 | Disposition: A | Discharge status: Home / Self Care | Payer: Medicaid Other | Attending: Emergency Medicine | Admitting: Emergency Medicine

## 2023-09-26 ENCOUNTER — Other Ambulatory Visit: Payer: Self-pay

## 2023-09-26 ENCOUNTER — Ambulatory Visit: Payer: Self-pay | Admitting: *Deleted

## 2023-09-26 ENCOUNTER — Encounter: Payer: Self-pay | Admitting: Emergency Medicine

## 2023-09-26 ENCOUNTER — Emergency Department: Payer: Medicaid Other

## 2023-09-26 DIAGNOSIS — Z20822 Contact with and (suspected) exposure to covid-19: Secondary | ICD-10-CM | POA: Diagnosis not present

## 2023-09-26 DIAGNOSIS — J189 Pneumonia, unspecified organism: Secondary | ICD-10-CM | POA: Diagnosis not present

## 2023-09-26 DIAGNOSIS — J181 Lobar pneumonia, unspecified organism: Secondary | ICD-10-CM | POA: Diagnosis not present

## 2023-09-26 DIAGNOSIS — R059 Cough, unspecified: Secondary | ICD-10-CM | POA: Diagnosis not present

## 2023-09-26 DIAGNOSIS — R918 Other nonspecific abnormal finding of lung field: Secondary | ICD-10-CM | POA: Diagnosis not present

## 2023-09-26 DIAGNOSIS — R509 Fever, unspecified: Secondary | ICD-10-CM | POA: Diagnosis not present

## 2023-09-26 LAB — RESP PANEL BY RT-PCR (RSV, FLU A&B, COVID)  RVPGX2
Influenza A by PCR: NEGATIVE
Influenza B by PCR: NEGATIVE
Resp Syncytial Virus by PCR: NEGATIVE
SARS Coronavirus 2 by RT PCR: NEGATIVE

## 2023-09-26 MED ORDER — PSEUDOEPH-BROMPHEN-DM 30-2-10 MG/5ML PO SYRP: 5.0000 mL | ORAL_SOLUTION | Freq: Four times a day (QID) | ORAL | 0 refills | Status: DC | PRN

## 2023-09-26 MED ORDER — DOXYCYCLINE HYCLATE 100 MG PO TABS: 100.0000 mg | ORAL_TABLET | Freq: Two times a day (BID) | ORAL | 0 refills | Status: DC

## 2023-09-26 NOTE — Telephone Encounter (Signed)
  Chief Complaint: Cough Symptoms: Productive cough, vomits at times after coughing. Subjective fever, sweating, chills. Chest pressure, congestion, headache. SOB Frequency: SAturday Pertinent Negatives: Patient denies  Disposition: [] ED /[] Urgent Care (no appt availability in office) / [x] Appointment(In office/virtual)/ []  [facility]/ [] Home Care/ [] Refused Recommended Disposition /[]  Mobile Bus/ []  Follow-up with PCP Additional Notes: Appt secured for this afternoon. Care advise provided, pt verbalizes understanding.  Reason for Disposition  [1] MILD difficulty breathing (e.g., minimal/no SOB at rest, SOB with walking, pulse <100) AND [2] still present when not coughing  Answer Assessment - Initial Assessment Questions 1. ONSET: "When did the cough begin?"      Saturday 2. SEVERITY: "How bad is the cough today?"      Vomiting after coughing 3. SPUTUM: "Describe the color of your sputum" (none, dry cough; clear, white, yellow, green)     Varies 4. HEMOPTYSIS: "Are you coughing up any blood?" If so ask: "How much?" (flecks, streaks, tablespoons, etc.)     No 5. DIFFICULTY BREATHING: "Are you having difficulty breathing?" If Yes, ask: "How bad is it?" (e.g., mild, moderate, severe)    - MILD: No SOB at rest, mild SOB with walking, speaks normally in sentences, can lie down, no retractions, pulse < 100.    - MODERATE: SOB at rest, SOB with minimal exertion and prefers to sit, cannot lie down flat, speaks in phrases, mild retractions, audible wheezing, pulse 100-120.    - SEVERE: Very SOB at rest, speaks in single words, struggling to breathe, sitting hunched forward, retractions, pulse > 120      With coughing 6. FEVER: "Do you have a fever?" If Yes, ask: "What is your temperature, how was it measured, and when did it start?"     Subjective sweating and chills 10. OTHER SYMPTOMS: "Do you have any other symptoms?" (e.g., runny nose, wheezing, chest pain)       Chest  pressure, congestion, headache. Vomits after coughing  Protocols used: Cough - Acute Productive-A-AH

## 2023-09-26 NOTE — ED Triage Notes (Signed)
Pt states that her son had bronchitis and her nephew had pneumonia, states that she started feeling bad on Saturday, reports cough and congestion coughing so hard until she vomits, reports intermittent fever

## 2023-09-26 NOTE — ED Provider Notes (Signed)
[facility] Provider Note    Event Date/Time   First MD Initiated Contact with Patient 09/26/23 1142     (approximate)   History   Cough   HPI  Kimberly Mosley is a 27 y.o. female with history of seizures, Bell's palsy presents emergency department with cough, congestion, intermittent fever since Saturday.  Patient states that her son has bronchitis and her nephew has pneumonia.  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  States cough is causing her to vomit afterwards but no "whoop" at the end.  Denies chest pain or shortness of breath.  No diarrhea.  Patient states throat irritation from coughing but no sore throat.      Physical Exam   Triage Vital Signs: ED Triage Vitals  Encounter Vitals Group     BP 09/26/23 1048 125/88     Systolic BP Percentile --      Diastolic BP Percentile --      Pulse Rate 09/26/23 1048 94     Resp 09/26/23 1048 16     Temp 09/26/23 1048 98.6 F (37 C)     Temp Source 09/26/23 1048 Oral     SpO2 09/26/23 1048 95 %     Weight 09/26/23 1049 180 lb (81.6 kg)     Height 09/26/23 1049 5\' 3"  (1.6 m)     Head Circumference --      Peak Flow --      Pain Score 09/26/23 1048 7     Pain Loc --      Pain Education --      Exclude from Growth Chart --     Most recent vital signs: Vitals:   09/26/23 1048  BP: 125/88  Pulse: 94  Resp: 16  Temp: 98.6 F (37 C)  SpO2: 95%     General: Awake, no distress.   CV:  Good peripheral perfusion. regular rate and  rhythm Resp:  Normal effort. Lungs cta Abd:  No distention.   Other:      ED Results / Procedures / Treatments   Labs (all labs ordered are listed, but only abnormal results are displayed) Labs Reviewed  RESP PANEL BY RT-PCR (RSV, FLU A&B, COVID)  [id number]     EKG     RADIOLOGY Chest x-ray    PROCEDURES:   Procedures   MEDICATIONS ORDERED IN ED: Medications - No data to display   IMPRESSION / MDM / ASSESSMENT AND PLAN / ED  COURSE  I reviewed the triage vital signs and the nursing notes.                              Differential diagnosis includes, but is not limited to, acute URI, bronchitis, COVID, influenza, RSV, CAP  Patient's presentation is most consistent with acute illness / injury with system symptoms.   Respiratory panel, chest x-ray ordered   Respiratory panel reassuring  Chest x-ray independently reviewed interpreted by me as having a possible right lower lobe pneumonia.  Radiologist interpretation is not available yet.  I did explain to the patient I do not think this would change her treatment at this time.  However would like for her to check [facility] MyChart because if it is positive for pneumonia she will need a repeat chest x-ray in 2 to 3 weeks.  Patient is in agreement treatment plan.  She is placed on doxycycline and Bromfed  cough syrup.  Discharged stable condition.   FINAL CLINICAL IMPRESSION(S) / ED DIAGNOSES   Final diagnoses:  Community acquired pneumonia of right lower lobe of lung     Rx / DC Orders   ED Discharge Orders          Ordered    doxycycline (VIBRA-TABS) 100 MG tablet  2 times daily        09/26/23 1239    brompheniramine-pseudoephedrine-DM 30-2-10 MG/5ML syrup  4 times daily PRN        09/26/23 1239             Note:  This document was prepared using Dragon voice recognition software and may include unintentional dictation errors.    Faythe Ghee, PA-C 09/26/23 1241    Corena Herter, MD 09/26/23 [phone]

## 2023-10-02 ENCOUNTER — Encounter: Payer: Self-pay | Admitting: Family Medicine

## 2023-10-02 ENCOUNTER — Ambulatory Visit (INDEPENDENT_AMBULATORY_CARE_PROVIDER_SITE_OTHER): Payer: Medicaid Other | Admitting: Family Medicine

## 2023-10-02 VITALS — BP 112/55 | HR 87 | Resp 20 | Ht 63.0 in | Wt 195.4 lb

## 2023-10-02 DIAGNOSIS — J159 Unspecified bacterial pneumonia: Secondary | ICD-10-CM | POA: Diagnosis not present

## 2023-10-02 DIAGNOSIS — R6889 Other general symptoms and signs: Secondary | ICD-10-CM | POA: Diagnosis not present

## 2023-10-02 NOTE — Progress Notes (Signed)
Established patient visit   Patient: Kimberly Mosley   DOB: [birth year]   27 y.o. Female  MRN: [id number] Visit Date: 10/02/2023  Today's healthcare provider: Ronnald Ramp, MD   Chief Complaint  Patient presents with   Pneumonia    ER f/u   Subjective     HPI     Pneumonia    Additional comments: ER f/u      Last edited by Ashok Cordia, CMA on 10/02/2023  3:11 PM.       Discussed the use of AI scribe software for clinical note transcription with the patient, who gave verbal consent to proceed.  History of Present Illness   The patient, a 27 year old with a recent diagnosis of pneumonia on 09/26/23, presents for follow-up. She reports that this was her first experience with pneumonia, describing it as "worse than having COVID". The patient experienced significant chest congestion, particularly in the lower chest, but denied any upper respiratory symptoms prior to diagnosis. She was prescribed two medications, one of which was doxycycline 100mg  BID for 10 days (the other was a cough suppressant syrup), but experienced severe nausea and vomiting, leading to discontinuation of both medications.  Despite the cessation of the medications, the patient reports feeling much better. She reports having 4-5 pills of doxycycline left   The patient also has a history of fertility issues and has been in contact with a reproductive endocrinology clinic. However, due to financial constraints and a pending job opportunity, she has decided to postpone further fertility evaluations and treatments. The patient is eager to start her new job, which offers health-promoting amenities such as an on-site gym and daycare.  The patient also mentions having to adapt her responses to her son's  outbursts (ADHD).     Past Medical History:  Diagnosis Date   Anemia    Anxiety    situational   H/O Bell's palsy    Medical history non-contributory    Seizures (HCC)      Medications: Outpatient Medications Prior to Visit  Medication Sig   busPIRone (BUSPAR) 5 MG tablet Take 1 tablet (5 mg total) by mouth 2 (two) times daily.   [DISCONTINUED] brompheniramine-pseudoephedrine-DM 30-2-10 MG/5ML syrup Take 5 mLs by mouth 4 (four) times daily as needed.   [DISCONTINUED] doxycycline (VIBRA-TABS) 100 MG tablet Take 1 tablet (100 mg total) by mouth 2 (two) times daily.   No facility-administered medications prior to visit.    Review of Systems  Last metabolic panel Lab Results  Component Value Date   GLUCOSE 104 (H) 08/05/2023   NA 135 08/05/2023   K 4.8 08/05/2023   CL 104 08/05/2023   CO2 21 (L) 08/05/2023   BUN 10 08/05/2023   CREATININE 0.71 08/05/2023   GFRNONAA >60 08/05/2023   CALCIUM 9.5 08/05/2023   PROT 6.8 10/30/2021   ALBUMIN 4.7 10/30/2021   LABGLOB 2.1 10/30/2021   AGRATIO 2.2 10/30/2021   BILITOT <0.2 10/30/2021   ALKPHOS 68 10/30/2021   AST 23 10/30/2021   ALT 17 10/30/2021   ANIONGAP 10 08/05/2023    DG Chest 2 View  Result Date: 09/26/2023 CLINICAL DATA:  Cough and fever.  Family member has pneumonia. EXAM: CHEST - 2 VIEW COMPARISON:  Chest radiographs 08/06/2023 FINDINGS: There is new patchy heterogeneous airspace opacity within the right lower lung, localized to the right lower lobe on lateral view. The left lung appears clear. No pleural effusion pneumothorax. Cardiac silhouette and mediastinal contours are within normal  limits. Mildly decreased lung volumes, similar to prior. No acute skeletal abnormality.   IMPRESSION: New patchy heterogeneous airspace opacity within the right lower lung, localized to the right lower lobe on lateral view, suspicious for pneumonia. Consider follow-up radiographs 6 weeks after treatment to ensure resolution.      Objective    BP (!) 112/55   Pulse 87   Resp 20   Ht 5\' 3"  (1.6 m)   Wt 195 lb 6.4 oz (88.6 kg)   SpO2 96%   BMI 34.61 kg/m   BP Readings from Last 3 Encounters:   10/02/23 (!) 112/55  09/26/23 125/88  08/06/23 125/73   Wt Readings from Last 3 Encounters:  10/02/23 195 lb 6.4 oz (88.6 kg)  09/26/23 180 lb (81.6 kg)  08/06/23 196 lb 3.2 oz (89 kg)        Physical Exam Vitals reviewed.  Constitutional:      General: She is not in acute distress.    Appearance: Normal appearance. She is not ill-appearing, toxic-appearing or diaphoretic.  Eyes:     Conjunctiva/sclera: Conjunctivae normal.  Cardiovascular:     Rate and Rhythm: Normal rate and regular rhythm.     Pulses: Normal pulses.     Heart sounds: Normal heart sounds. No murmur heard.    No friction rub. No gallop.  Pulmonary:     Effort: Pulmonary effort is normal. No respiratory distress.     Breath sounds: Normal breath sounds. No stridor. No wheezing, rhonchi or rales.  Abdominal:     General: Bowel sounds are normal. There is no distension.     Palpations: Abdomen is soft.     Tenderness: There is no abdominal tenderness.  Musculoskeletal:     Right lower leg: No edema.     Left lower leg: No edema.  Skin:    Findings: No erythema or rash.  Neurological:     Mental Status: She is alert and oriented to person, place, and time.       No results found for any visits on 10/02/23.  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 Reports significant improvement but experienced severe side effects from doxycycline and cough medicine, including nausea and vomiting. Discontinued medications early due to these side effects, raising concerns about potential antibiotic resistance and incomplete treatment. - Educate on the importance of completing antibiotic courses to prevent resistance and ensure effective treatment. - Advise to contact healthcare provider if intolerable  side effects occur to discuss alternative treatments. - Monitor for any recurrence of symptoms and advise to seek medical attention if symptoms worsen.  Fertility Evaluation In contact with reproductive endocrinology for fertility evaluation but decided to delay further action until financially stable and settled in a new job. Discussed the financial requirement of $300 down payment and the plan to start a new job at the [employer] in June. - Pause fertility evaluation until financially stable. - Follow up with reproductive endocrinology when ready to proceed.  General Health Maintenance Discussed the importance of a full physical exam, including screenings, vaccinations, and preventative health measures. Planning to start a new job that requires a physical exam and has a gym on-site for regular exercise.  Prefers to schedule the physical exam in April to align with job requirements. - Schedule a full physical exam in April 2025 - Perform head-to-toe exam and order labs for diabetes, anemia, kidney, and liver function during the physical exam. - Encourage regular exercise and healthy eating habits.     Return in about 4 months (around 01/31/2024) for CPE.       Ronnald Ramp, MD  [facility] [phone] (phone) [phone] (fax)  [facility]

## 2023-11-28 ENCOUNTER — Telehealth: Payer: Self-pay

## 2023-11-28 DIAGNOSIS — Z111 Encounter for screening for respiratory tuberculosis: Secondary | ICD-10-CM

## 2023-11-28 NOTE — Addendum Note (Signed)
 Addended by: SIMMONS-ROBINSON, Plummer Matich L on: 11/28/2023 04:32 PM   Modules accepted: Orders

## 2023-11-28 NOTE — Telephone Encounter (Signed)
 Copied from CRM [id number]. Topic: Appointment Scheduling - Scheduling Inquiry for Clinic >> Nov 28, 2023 11:35 AM Adaline Holly wrote: Patient needs to have a TB test done for pre employment. Please follow up with patient to schedule.

## 2023-11-28 NOTE — Telephone Encounter (Signed)
 [employer]Gold ordered. Patient can come into the lab to have this drawn

## 2024-02-02 NOTE — Progress Notes (Deleted)
 Complete physical exam   Patient: Kimberly Mosley   DOB: September 29, 1996   27 y.o. Female  MRN: [id number] Visit Date: 02/03/2024  Today's healthcare provider: Mimi Alt, MD   No chief complaint on file.  Subjective    Kimberly Mosley is a 28 y.o. female who presents today for a complete physical exam.   She reports consuming a {diet types:17450} diet.   {Exercise:19826}    She {does/does not:200015} have additional problems to discuss today.   Discussed the use of AI scribe software for clinical note transcription with the patient, who gave verbal consent to proceed.  History of Present Illness      Past Medical History:  Diagnosis Date  . Anemia   . Anxiety    situational  . H/O Bell's palsy   . Medical history non-contributory   . Seizures (HCC)    Past Surgical History:  Procedure Laterality Date  . INCISION AND DRAINAGE ABSCESS Left 12/24/2019   Procedure: INCISION AND DRAINAGE ABSCESS;  Surgeon: Eldred Grego, MD;  Location: [facility] ORS;  Service: General;  Laterality: Left;  . NO PAST SURGERIES     Social History   Socioeconomic History  . Marital status: Single    Spouse name: Not on file  . Number of children: 1  . Years of education: Not on file  . Highest education level: GED or equivalent  Occupational History  . Not on file  Tobacco Use  . Smoking status: Former    Current packs/day: 0.00    Average packs/day: 0.5 packs/day for 7.0 years (3.5 ttl pk-yrs)    Types: Cigarettes    Start date: 01/04/2015    Quit date: 01/03/2022    Years since quitting: 2.0  . Smokeless tobacco: Never  Vaping Use  . Vaping status: Never Used  Substance and Sexual Activity  . Alcohol use: Not Currently    Alcohol/week: 2.0 standard drinks of alcohol    Types: 2 Cans of beer per week    Comment: Last ETOH use 2 days ago.  . Drug use: No  . Sexual activity: Yes    Birth control/protection: None    Comment: Nexplanon removed 12/2018 with no  birth control used since removal.  Other Topics Concern  . Not on file  Social History Narrative  . Not on file   Social Drivers of Health   Financial Resource Strain: Low Risk  (06/26/2023)   Overall Financial Resource Strain (CARDIA)   . Difficulty of Paying Living Expenses: Not hard at all  Food Insecurity: No Food Insecurity (06/26/2023)   Hunger Vital Sign   . Worried About Programme researcher, broadcasting/film/video in the Last Year: Never true   . Ran Out of Food in the Last Year: Never true  Transportation Needs: No Transportation Needs (06/26/2023)   PRAPARE - Transportation   . Lack of Transportation (Medical): No   . Lack of Transportation (Non-Medical): No  Physical Activity: Sufficiently Active (06/26/2023)   Exercise Vital Sign   . Days of Exercise per Week: 7 days   . Minutes of Exercise per Session: 60 min  Stress: Not on file (08/29/2023)  Social Connections: Socially Isolated (06/26/2023)   Social Connection and Isolation Panel [NHANES]   . Frequency of Communication with Friends and Family: Once a week   . Frequency of Social Gatherings with Friends and Family: Once a week   . Attends Religious Services: More than 4 times per year   . Active Member  of Clubs or Organizations: No   . Attends Banker Meetings: Not on file   . Marital Status: Never married  Intimate Partner Violence: Not At Risk (09/01/2019)   Humiliation, Afraid, Rape, and Kick questionnaire   . Fear of Current or Ex-Partner: No   . Emotionally Abused: No   . Physically Abused: No   . Sexually Abused: No   Family Status  Relation Name Status  . Father  Alive  . Mother  Alive  . Sister  Alive  . Brother  Alive  . Mat Uncle  (Not Specified)  . MGM  Alive  . MGF  Alive  . Neg Hx  (Not Specified)  No partnership data on file   Family History  Problem Relation Age of Onset  . Depression Father   . Bipolar disorder Father   . Healthy Mother   . Healthy Sister   . Healthy Brother   . Cancer Maternal Uncle         pancreatic cancer  . Heart disease Maternal Grandmother   . Stroke Maternal Grandmother   . Healthy Maternal Grandfather   . Breast cancer Neg Hx   . Colon cancer Neg Hx   . Ovarian cancer Neg Hx    No Known Allergies   Medications: Outpatient Medications Prior to Visit  Medication Sig  . busPIRone (BUSPAR) 5 MG tablet Take 1 tablet (5 mg total) by mouth 2 (two) times daily.   No facility-administered medications prior to visit.    Review of Systems  Last CBC Lab Results  Component Value Date   WBC 11.4 (H) 08/05/2023   HGB 14.5 08/05/2023   HCT 43.6 08/05/2023   MCV 88.4 08/05/2023   MCH 29.4 08/05/2023   RDW 12.4 08/05/2023   PLT 277 08/05/2023   Last metabolic panel Lab Results  Component Value Date   GLUCOSE 104 (H) 08/05/2023   NA 135 08/05/2023   K 4.8 08/05/2023   CL 104 08/05/2023   CO2 21 (L) 08/05/2023   BUN 10 08/05/2023   CREATININE 0.71 08/05/2023   GFRNONAA >60 08/05/2023   CALCIUM 9.5 08/05/2023   PROT 6.8 10/30/2021   ALBUMIN 4.7 10/30/2021   LABGLOB 2.1 10/30/2021   AGRATIO 2.2 10/30/2021   BILITOT <0.2 10/30/2021   ALKPHOS 68 10/30/2021   AST 23 10/30/2021   ALT 17 10/30/2021   ANIONGAP 10 08/05/2023   Last lipids No results found for: "CHOL", "HDL", "LDLCALC", "LDLDIRECT", "TRIG", "CHOLHDL" Last hemoglobin A1c Lab Results  Component Value Date   HGBA1C 5.1 10/30/2021   Last thyroid functions Lab Results  Component Value Date   TSH 1.120 10/30/2021   Last vitamin D Lab Results  Component Value Date   VD25OH 30 05/01/2017   Last vitamin B12 and Folate No results found for: "VITAMINB12", "FOLATE"   {See past labs  Heme  Chem  Endocrine  Serology  Results Review (optional):1}  Objective    There were no vitals taken for this visit. BP Readings from Last 3 Encounters:  10/02/23 (!) 112/55  09/26/23 125/88  08/06/23 125/73   Wt Readings from Last 3 Encounters:  10/02/23 195 lb 6.4 oz (88.6 kg)  09/26/23 180  lb (81.6 kg)  08/06/23 196 lb 3.2 oz (89 kg)    {See vitals history (optional):1}    Physical Exam  ***  Last depression screening scores    08/06/2023    1:27 PM 06/27/2023    3:56 PM 07/20/2022   10:07  AM  PHQ 2/9 Scores  PHQ - 2 Score 0 0 0  PHQ- 9 Score 7 9 0    Last fall risk screening    07/20/2022   10:07 AM  Fall Risk   Falls in the past year? 0  Number falls in past yr: 0  Injury with Fall? 0  Risk for fall due to : No Fall Risks  Follow up Falls evaluation completed    Last Audit-C alcohol use screening    06/26/2023   12:21 PM  Alcohol Use Disorder Test (AUDIT)  1. How often do you have a drink containing alcohol? 0  2. How many drinks containing alcohol do you have on a typical day when you are drinking? 0  3. How often do you have six or more drinks on one occasion? 0  AUDIT-C Score 0      Patient-reported   A score of 3 or more in women, and 4 or more in men indicates increased risk for alcohol abuse, EXCEPT if all of the points are from question 1   No results found for any visits on 02/03/24.  Assessment & Plan    Routine Health Maintenance and Physical Exam  Immunization History  Administered Date(s) Administered  . MMR 01/14/2016  . Tdap 01/14/2016    Health Maintenance  Topic Date Due  . COVID-19 Vaccine (1) Never done  . HIV Screening  Never done  . Hepatitis C Screening  Never done  . INFLUENZA VACCINE  05/22/2024  . Cervical Cancer Screening (Pap smear)  01/17/2025  . DTaP/Tdap/Td (2 - Td or Tdap) 01/13/2026  . HPV VACCINES  Aged Out  . Meningococcal B Vaccine  Aged Out    Problem List Items Addressed This Visit   None   Assessment and Plan Assessment & Plan        No follow-ups on file.       Mimi Alt, MD  [facility] [phone] (phone) [phone] (fax)  [facility]

## 2024-02-03 ENCOUNTER — Encounter: Payer: Self-pay | Admitting: Family Medicine

## 2024-02-03 DIAGNOSIS — Z13220 Encounter for screening for lipoid disorders: Secondary | ICD-10-CM

## 2024-02-03 DIAGNOSIS — Z0000 Encounter for general adult medical examination without abnormal findings: Secondary | ICD-10-CM

## 2024-02-03 DIAGNOSIS — F419 Anxiety disorder, unspecified: Secondary | ICD-10-CM

## 2024-02-03 DIAGNOSIS — Z114 Encounter for screening for human immunodeficiency virus [HIV]: Secondary | ICD-10-CM

## 2024-02-03 DIAGNOSIS — E66811 Obesity, class 1: Secondary | ICD-10-CM

## 2024-02-03 DIAGNOSIS — Z130 Encounter for screening for diseases of the blood and blood-forming organs and certain disorders involving the immune mechanism: Secondary | ICD-10-CM

## 2024-02-03 DIAGNOSIS — Z1159 Encounter for screening for other viral diseases: Secondary | ICD-10-CM

## 2024-02-03 DIAGNOSIS — Z131 Encounter for screening for diabetes mellitus: Secondary | ICD-10-CM

## 2024-02-03 NOTE — Assessment & Plan Note (Deleted)
 Chronic conditions are stable  Patient was counseled on benefits of regular physical activity with goal of 150 minutes of moderate to vigurous intensity 4 days per week  Patient was counseled to consume well balanced diet of fruits, vegetables, limited saturated fats and limited sugary foods and beverages with emphasis on consuming 6-8 glasses of water daily  Screening recommended today: A1c, lipids,CMP,CBC,Hep C,HIV  Colon cancer screening: not due yet    Cervical CA screening: UTD, NILM in 2023   Mammogram: not yet due     Lung CA screening CT: N/A     Vaccines recommended today: COVID

## 2024-05-08 ENCOUNTER — Emergency Department
Admission: EM | Admit: 2024-05-08 | Discharge: 2024-05-08 | Disposition: A | Discharge status: Home / Self Care | Attending: Emergency Medicine | Admitting: Emergency Medicine

## 2024-05-08 ENCOUNTER — Other Ambulatory Visit: Payer: Self-pay

## 2024-05-08 ENCOUNTER — Emergency Department

## 2024-05-08 ENCOUNTER — Encounter: Payer: Self-pay | Admitting: Emergency Medicine

## 2024-05-08 DIAGNOSIS — R0789 Other chest pain: Secondary | ICD-10-CM | POA: Diagnosis not present

## 2024-05-08 DIAGNOSIS — J40 Bronchitis, not specified as acute or chronic: Secondary | ICD-10-CM | POA: Diagnosis not present

## 2024-05-08 DIAGNOSIS — F17200 Nicotine dependence, unspecified, uncomplicated: Secondary | ICD-10-CM | POA: Insufficient documentation

## 2024-05-08 DIAGNOSIS — R059 Cough, unspecified: Secondary | ICD-10-CM | POA: Diagnosis not present

## 2024-05-08 DIAGNOSIS — R079 Chest pain, unspecified: Secondary | ICD-10-CM | POA: Diagnosis not present

## 2024-05-08 DIAGNOSIS — R0602 Shortness of breath: Secondary | ICD-10-CM | POA: Diagnosis not present

## 2024-05-08 LAB — BASIC METABOLIC PANEL WITH GFR
Anion gap: 7 (ref 5–15)
BUN: 9 mg/dL (ref 6–20)
CO2: 23 mmol/L (ref 22–32)
Calcium: 9.2 mg/dL (ref 8.9–10.3)
Chloride: 111 mmol/L (ref 98–111)
Creatinine, Ser: 0.89 mg/dL (ref 0.44–1.00)
GFR, Estimated: >60 mL/min (ref >60)
Glucose, Bld: 117 mg/dL — ABNORMAL HIGH (ref 70–99)
Potassium: 3.6 mmol/L (ref 3.5–5.1)
Sodium: 141 mmol/L (ref 135–145)

## 2024-05-08 LAB — CBC
HCT: 40.4 % (ref 36.0–46.0)
Hemoglobin: 13.5 g/dL (ref 12.0–15.0)
MCH: 29.5 pg (ref 26.0–34.0)
MCHC: 33.4 g/dL (ref 30.0–36.0)
MCV: 88.4 fL (ref 80.0–100.0)
Platelets: 282 K/uL (ref 150–400)
RBC: 4.57 MIL/uL (ref 3.87–5.11)
RDW: 12.6 % (ref 11.5–15.5)
WBC: 7.6 K/uL (ref 4.0–10.5)
nRBC: 0 % (ref 0.0–0.2)

## 2024-05-08 LAB — TROPONIN I (HIGH SENSITIVITY)
Troponin I (High Sensitivity): <2 ng/L (ref <18)
Troponin I (High Sensitivity): <2 ng/L (ref <18)

## 2024-05-08 LAB — POC URINE PREG, ED: Preg Test, Ur: NEGATIVE

## 2024-05-08 MED ORDER — AZITHROMYCIN 250 MG PO TABS: ORAL_TABLET | ORAL | 0 refills | Status: AC

## 2024-05-08 MED ORDER — IBUPROFEN 600 MG PO TABS
600.0000 mg | ORAL_TABLET | Freq: Once | ORAL | Status: AC
  Administered 2024-05-08: 600 mg via ORAL
  Filled 2024-05-08: qty 1

## 2024-05-08 MED ORDER — PREDNISONE 10 MG PO TABS: 40.0000 mg | ORAL_TABLET | Freq: Every day | ORAL | 0 refills | Status: AC

## 2024-05-08 MED ORDER — AZITHROMYCIN 500 MG PO TABS
500.0000 mg | ORAL_TABLET | Freq: Once | ORAL | Status: AC
  Administered 2024-05-08: 500 mg via ORAL
  Filled 2024-05-08: qty 1

## 2024-05-08 MED ORDER — PREDNISONE 20 MG PO TABS
60.0000 mg | ORAL_TABLET | Freq: Once | ORAL | Status: AC
  Administered 2024-05-08: 60 mg via ORAL
  Filled 2024-05-08: qty 3

## 2024-05-08 NOTE — ED Notes (Signed)
 Urine sample sent to lab

## 2024-05-08 NOTE — ED Notes (Signed)
 Pt presented to ED with c/o heaviness in the center of her chest that started last night when she was trying to go to sleep. Pt states pain has been constant. Pt endorses non productive, wet, cough x 1 week. Pt denies n/v, dizziness. Denies cardiac hx, denies respiratory history.

## 2024-05-08 NOTE — Discharge Instructions (Signed)
 Your laboratory workup is reassuring today.  I suspect this is most likely a bronchitis given the duration of your symptoms and have sent antibiotics and steroids to your pharmacy.  Please take this prescribed and follow-up with your primary care provider as needed.  Please return for any severe worsening symptoms.

## 2024-05-08 NOTE — ED Provider Notes (Signed)
 [facility] Provider Note    Event Date/Time   First MD Initiated Contact with Patient 05/08/24 2257     (approximate)   History   Cough and Chest Pain   HPI Kimberly Mosley is a 28 y.o. female presenting today for cough and shortness of breath.  Patient states for the past week she has had a nonproductive cough with occasional shortness of breath.  Last night when going to bed she noted a little bit of pressure in her chest when lying flat.  States that it is not been particularly consistent at all today.  Otherwise denies fever, runny nose, abdominal pain, leg pain, leg swelling.  Not on oral contraceptives.  States she had bronchitis last year that felt similar to this.  Patient smokes 1/2 pack/day.     Physical Exam   Triage Vital Signs: ED Triage Vitals  Encounter Vitals Group     BP 05/08/24 2129 128/78     Girls Systolic BP Percentile --      Girls Diastolic BP Percentile --      Boys Systolic BP Percentile --      Boys Diastolic BP Percentile --      Pulse Rate 05/08/24 2129 86     Resp 05/08/24 2129 20     Temp 05/08/24 2129 98.3 F (36.8 C)     Temp Source 05/08/24 2129 Oral     SpO2 05/08/24 2129 95 %     Weight 05/08/24 2130 180 lb (81.6 kg)     Height 05/08/24 2130 5' 2 (1.575 m)     Head Circumference --      Peak Flow --      Pain Score 05/08/24 2136 8     Pain Loc --      Pain Education --      Exclude from Growth Chart --     Most recent vital signs: Vitals:   05/08/24 2315 05/08/24 2330  BP: 111/71 118/87  Pulse: 92 85  Resp: 16 (!) 21  Temp:    SpO2: 97% 99%   Physical Exam: I have reviewed the vital signs and nursing notes. General: Awake, alert, no acute distress.  Nontoxic appearing. Head:  Atraumatic, normocephalic.   ENT:  EOM intact, PERRL. Oral mucosa is pink and moist with no lesions. Neck: Neck is supple with full range of motion Cardiovascular:  RRR, No murmurs. Peripheral pulses palpable and equal  bilaterally. Respiratory:  Symmetrical chest wall expansion.  No rhonchi, rales, or wheezes.  Good air movement throughout.  No use of accessory muscles.   Musculoskeletal:  No cyanosis or edema. Moving extremities with full ROM Abdomen:  Soft, nontender, nondistended. Neuro:  GCS 15, moving all four extremities, interacting appropriately. Speech clear. Psych:  Calm, appropriate.   Skin:  Warm, dry, no rash.    ED Results / Procedures / Treatments   Labs (all labs ordered are listed, but only abnormal results are displayed) Labs Reviewed  BASIC METABOLIC PANEL WITH GFR - Abnormal; Notable for the following components:      Result Value   Glucose, Bld 117 (*)    All other components within normal limits  CBC  POC URINE PREG, ED  TROPONIN I (HIGH SENSITIVITY)  TROPONIN I (HIGH SENSITIVITY)     EKG My EKG interpretation: Rate of 94, normal sinus rhythm, normal axis, normal intervals.  No acute ST elevations or depressions   RADIOLOGY Independently interpreted chest x-ray with no acute pathology  PROCEDURES:  Critical Care performed: No  Procedures   MEDICATIONS ORDERED IN ED: Medications  ibuprofen (ADVIL) tablet 600 mg (600 mg Oral Given 05/08/24 2329)  azithromycin (ZITHROMAX) tablet 500 mg (500 mg Oral Given 05/08/24 2329)  predniSONE  (DELTASONE ) tablet 60 mg (60 mg Oral Given 05/08/24 2329)     IMPRESSION / MDM / ASSESSMENT AND PLAN / ED COURSE  I reviewed the triage vital signs and the nursing notes.                              Differential diagnosis includes, but is not limited to, bronchitis, pneumonia, postviral syndrome, low concern for ACS or pneumothorax  Patient's presentation is most consistent with acute complicated illness / injury requiring diagnostic workup.  Patient is a 28 year old female presenting today for 1 week of nonproductive cough associated with shortness of breath and occasional chest pressure.  Chest pain symptoms are not present at  this time.  Her exam is unremarkable with no wheezing or crackles present.  Vital signs are stable.  Patient is PERC negative.  CBC, BMP negative.  Chest x-ray negative.  Troponin negative.  I suspect her symptoms may be potential bronchitis at this time given persistence of symptoms.  She is otherwise very well-appearing with no hypoxia, tachycardia, or tachypnea.  Will start her on a Z-Pak and prednisone  as this has helped her in the past.  Told to follow-up with PCP and otherwise stable for discharge.  Given strict return precautions.  The patient is on the cardiac monitor to evaluate for evidence of arrhythmia and/or significant heart rate changes.     FINAL CLINICAL IMPRESSION(S) / ED DIAGNOSES   Final diagnoses:  Bronchitis     Rx / DC Orders   ED Discharge Orders          Ordered    predniSONE  (DELTASONE ) 10 MG tablet  Daily        05/08/24 2333    azithromycin (ZITHROMAX Z-PAK) 250 MG tablet        05/08/24 2333             Note:  This document was prepared using Dragon voice recognition software and may include unintentional dictation errors.   Malvina Alm DASEN, MD 05/08/24 2350

## 2024-05-08 NOTE — ED Triage Notes (Signed)
 Pt reports nonproductive cough x1 week. Pt states chest heaviness  and sob started last night, worse w/ lying down.

## 2024-05-19 ENCOUNTER — Inpatient Hospital Stay: Admitting: Family Medicine

## 2024-05-19 NOTE — Progress Notes (Deleted)
      Established patient visit   Patient: Kimberly Mosley   DOB: [birth year]   28 y.o. Female  MRN: [id number] Visit Date: 05/19/2024  Today's healthcare provider: Rockie Agent, MD   No chief complaint on file.  Subjective       Discussed the use of AI scribe software for clinical note transcription with the patient, who gave verbal consent to proceed.  History of Present Illness      Past Medical History:  Diagnosis Date  . Anemia   . Anxiety    situational  . H/O Bell's palsy   . Medical history non-contributory   . Seizures (HCC)     Medications: Outpatient Medications Prior to Visit  Medication Sig  . busPIRone  (BUSPAR ) 5 MG tablet Take 1 tablet (5 mg total) by mouth 2 (two) times daily.   No facility-administered medications prior to visit.    Review of Systems  {Insert previous labs (optional):23779} {See past labs  Heme  Chem  Endocrine  Serology  Results Review (optional):1}   Objective    LMP 05/01/2024 (Approximate)  {Insert last BP/Wt (optional):23777}{See vitals history (optional):1}    Physical Exam  ***  No results found for any visits on 05/19/24.  Assessment & Plan     Problem List Items Addressed This Visit   None   Assessment and Plan Assessment & Plan      No follow-ups on file.         Rockie Agent, MD  [facility] [phone] (phone) [phone] (fax)  [facility]

## 2024-06-06 ENCOUNTER — Emergency Department

## 2024-06-06 ENCOUNTER — Other Ambulatory Visit: Payer: Self-pay

## 2024-06-06 DIAGNOSIS — W230XXA Caught, crushed, jammed, or pinched between moving objects, initial encounter: Secondary | ICD-10-CM | POA: Diagnosis not present

## 2024-06-06 DIAGNOSIS — S62660A Nondisplaced fracture of distal phalanx of right index finger, initial encounter for closed fracture: Secondary | ICD-10-CM | POA: Diagnosis not present

## 2024-06-06 DIAGNOSIS — S62630A Displaced fracture of distal phalanx of right index finger, initial encounter for closed fracture: Secondary | ICD-10-CM | POA: Insufficient documentation

## 2024-06-06 DIAGNOSIS — S6991XA Unspecified injury of right wrist, hand and finger(s), initial encounter: Secondary | ICD-10-CM | POA: Diagnosis present

## 2024-06-06 NOTE — ED Triage Notes (Signed)
 Pt presented to ED with c/o right index finger pain after slamming finger in car door. Pain 8/10. Denies taking OTC meds PTA for pain.

## 2024-06-07 ENCOUNTER — Emergency Department
Admission: EM | Admit: 2024-06-07 | Discharge: 2024-06-07 | Disposition: A | Discharge status: Home / Self Care | Attending: Emergency Medicine | Admitting: Emergency Medicine

## 2024-06-07 DIAGNOSIS — S62630A Displaced fracture of distal phalanx of right index finger, initial encounter for closed fracture: Secondary | ICD-10-CM

## 2024-06-07 MED ORDER — OXYCODONE-ACETAMINOPHEN 5-325 MG PO TABS: 1.0000 | ORAL_TABLET | ORAL | 0 refills | Status: AC | PRN

## 2024-06-07 NOTE — ED Provider Notes (Signed)
 [facility] Provider Note   Event Date/Time   First MD Initiated Contact with Patient 06/07/24 0134     (approximate) History  Finger Injury  HPI Artemis LAKYSHA KOSSMAN is a 28 y.o. female with a past medical history of anxiety/depression who presents after accidentally closing a car door on her right index finger.  Patient states that since that time the tip of her finger has become purple on the palmar side and she has had increasing pain and throbbing.  Patient states that she has been using ibuprofen  and Tylenol  that have not improved her pain.  Patient currently endorses 8/10, nonradiating pain. ROS: Patient currently denies any vision changes, tinnitus, difficulty speaking, facial droop, sore throat, chest pain, shortness of breath, abdominal pain, nausea/vomiting/diarrhea, dysuria, or weakness/numbness/paresthesias in any extremity   Physical Exam  Triage Vital Signs: ED Triage Vitals [06/06/24 2344]  Encounter Vitals Group     BP 123/78     Girls Systolic BP Percentile      Girls Diastolic BP Percentile      Boys Systolic BP Percentile      Boys Diastolic BP Percentile      Pulse Rate 85     Resp 18     Temp 98.2 F (36.8 C)     Temp Source Oral     SpO2 98 %     Weight 180 lb (81.6 kg)     Height 5' 2 (1.575 m)     Head Circumference      Peak Flow      Pain Score 8     Pain Loc      Pain Education      Exclude from Growth Chart    Most recent vital signs: Vitals:   06/06/24 2344  BP: 123/78  Pulse: 85  Resp: 18  Temp: 98.2 F (36.8 C)  SpO2: 98%   General: Awake, oriented x4. CV:  Good peripheral perfusion. Resp:  Normal effort. Abd:  No distention. Other:  Middle-aged obese Caucasian female resting comfortably in no acute distress.  Ecchymosis to the palmar aspect of the distal phalanx of the right index finger with significant tenderness to palpation and limited range of motion due to pain ED Results / Procedures / Treatments   Labs (all labs ordered are listed, but only abnormal results are displayed) Labs Reviewed - No data to display RADIOLOGY ED MD interpretation: X-ray of the right index finger shows a nondisplaced right tuft fracture - All radiology independently interpreted and agree with radiology assessment Official radiology report(s): DG Finger Index Right Result Date: 06/07/2024 CLINICAL DATA:  Closed finger in car door EXAM: RIGHT INDEX FINGER 2+V COMPARISON:  None Available. FINDINGS: Nondisplaced fracture through the tip of the right index finger distal phalanx. No subluxation or dislocation. No radiopaque foreign body. IMPRESSION: Nondisplaced right index finger tuft fracture. Electronically Signed   By: Franky Crease M.D.   On: 06/07/2024 00:22   PROCEDURES: Critical Care performed: No Procedures MEDICATIONS ORDERED IN ED: Medications - No data to display IMPRESSION / MDM / ASSESSMENT AND PLAN / ED COURSE  I reviewed the triage vital signs and the nursing notes.                             The patient is on the cardiac monitor to evaluate for evidence of arrhythmia and/or significant heart rate changes. Patient's presentation is most consistent with acute presentation with  potential threat to life or bodily function. The Pt was found to have a closed right index tuft fracture on XR. The Pt is otherwise well appearing, hemodynamically stable, and shows no evidence of neurovascular injury or compartment syndrome.  I have low suspicion for dislocation, significant ligamentous injury, septic arthritis, gout flare, new autoimmune arthropathy, or gonococcal arthropathy. Patient was placed in finger splint and will follow up with ortho.    Rx: norco  Dispo: Discharge home with orthopedic follow-up   FINAL CLINICAL IMPRESSION(S) / ED DIAGNOSES   Final diagnoses:  Closed fracture of tuft of distal phalanx of right index finger   Rx / DC Orders   ED Discharge Orders          Ordered     oxyCODONE -acetaminophen  (PERCOCET) 5-325 MG tablet  Every 4 hours PRN        06/07/24 0153           Note:  This document was prepared using Dragon voice recognition software and may include unintentional dictation errors.   Shabreka Coulon K, MD 06/07/24 [phone]

## 2024-06-07 NOTE — Discharge Instructions (Addendum)
 Please use ibuprofen (Motrin) up to 800 mg every 8 hours, naproxen (Naprosyn) up to 500 mg every 12 hours, and/or acetaminophen (Tylenol) up to 4 g/day for any continued pain.  Please do not use this medication regimen for longer than 7 days

## 2024-06-08 ENCOUNTER — Ambulatory Visit: Admitting: Family Medicine

## 2024-06-17 DIAGNOSIS — S62660A Nondisplaced fracture of distal phalanx of right index finger, initial encounter for closed fracture: Secondary | ICD-10-CM | POA: Diagnosis not present

## 2024-06-17 DIAGNOSIS — M79644 Pain in right finger(s): Secondary | ICD-10-CM | POA: Diagnosis not present

## 2024-07-01 DIAGNOSIS — Z6831 Body mass index (BMI) 31.0-31.9, adult: Secondary | ICD-10-CM | POA: Diagnosis not present

## 2024-07-01 DIAGNOSIS — S62660D Nondisplaced fracture of distal phalanx of right index finger, subsequent encounter for fracture with routine healing: Secondary | ICD-10-CM | POA: Diagnosis not present

## 2024-08-07 ENCOUNTER — Ambulatory Visit: Admitting: Family Medicine

## 2024-09-01 ENCOUNTER — Ambulatory Visit: Admitting: Family Medicine
